# Patient Record
Sex: Female | Born: 1990 | Race: Black or African American | Hispanic: No | Marital: Single
Health system: Southern US, Community
[De-identification: ages and names within clinical notes are randomized; demographics above are authoritative.]

## PROBLEM LIST (undated history)

## (undated) ENCOUNTER — Inpatient Hospital Stay (HOSPITAL_COMMUNITY): Payer: Self-pay

## (undated) DIAGNOSIS — A599 Trichomoniasis, unspecified: Secondary | ICD-10-CM

## (undated) DIAGNOSIS — B009 Herpesviral infection, unspecified: Secondary | ICD-10-CM

## (undated) DIAGNOSIS — Z789 Other specified health status: Secondary | ICD-10-CM

## (undated) HISTORY — PX: NO PAST SURGERIES: SHX2092

---
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
## 2010-01-05 ENCOUNTER — Ambulatory Visit (HOSPITAL_COMMUNITY): Admission: RE | Admit: 2010-01-05 | Discharge: 2010-01-05 | Payer: Self-pay | Admitting: Obstetrics

## 2010-01-16 ENCOUNTER — Inpatient Hospital Stay (HOSPITAL_COMMUNITY): Admission: AD | Admit: 2010-01-16 | Discharge: 2010-01-16 | Payer: Self-pay | Admitting: Obstetrics & Gynecology

## 2010-04-19 ENCOUNTER — Ambulatory Visit (HOSPITAL_COMMUNITY): Admission: RE | Admit: 2010-04-19 | Discharge: 2010-04-19 | Payer: Self-pay | Admitting: Obstetrics

## 2010-05-19 ENCOUNTER — Inpatient Hospital Stay (HOSPITAL_COMMUNITY): Admission: AD | Admit: 2010-05-19 | Discharge: 2010-05-19 | Payer: Self-pay | Admitting: Obstetrics

## 2010-06-27 ENCOUNTER — Inpatient Hospital Stay (HOSPITAL_COMMUNITY): Admission: AD | Admit: 2010-06-27 | Discharge: 2010-06-27 | Payer: Self-pay | Admitting: Obstetrics

## 2010-06-28 ENCOUNTER — Inpatient Hospital Stay (HOSPITAL_COMMUNITY): Admission: AD | Admit: 2010-06-28 | Discharge: 2010-07-02 | Payer: Self-pay | Admitting: Obstetrics & Gynecology

## 2010-12-28 LAB — CBC
HCT: 38 % (ref 36.0–46.0)
Hemoglobin: 10.3 g/dL — ABNORMAL LOW (ref 12.0–15.0)
Hemoglobin: 12.4 g/dL (ref 12.0–15.0)
MCH: 32.5 pg (ref 26.0–34.0)
MCHC: 32.7 g/dL (ref 30.0–36.0)
MCV: 98.5 fL (ref 78.0–100.0)
RBC: 3.05 MIL/uL — ABNORMAL LOW (ref 3.87–5.11)
RBC: 3.82 MIL/uL — ABNORMAL LOW (ref 3.87–5.11)

## 2010-12-28 LAB — URINE CULTURE
Colony Count: NO GROWTH
Special Requests: NEGATIVE

## 2010-12-28 LAB — MRSA PCR SCREENING: MRSA by PCR: NEGATIVE

## 2010-12-29 LAB — WET PREP, GENITAL: Trich, Wet Prep: NONE SEEN

## 2010-12-31 LAB — WET PREP, GENITAL
Clue Cells Wet Prep HPF POC: NONE SEEN
Trich, Wet Prep: NONE SEEN
Yeast Wet Prep HPF POC: NONE SEEN

## 2010-12-31 LAB — URINALYSIS, ROUTINE W REFLEX MICROSCOPIC
Hgb urine dipstick: NEGATIVE
Nitrite: NEGATIVE
Protein, ur: NEGATIVE mg/dL
Urobilinogen, UA: 0.2 mg/dL (ref 0.0–1.0)

## 2010-12-31 LAB — GC/CHLAMYDIA PROBE AMP, GENITAL: GC Probe Amp, Genital: NEGATIVE

## 2011-01-03 LAB — WET PREP, GENITAL: Trich, Wet Prep: NONE SEEN

## 2011-01-21 LAB — WET PREP, GENITAL
Trich, Wet Prep: NONE SEEN
WBC, Wet Prep HPF POC: NONE SEEN
Yeast Wet Prep HPF POC: NONE SEEN

## 2011-01-21 LAB — POCT URINALYSIS DIP (DEVICE)
Glucose, UA: NEGATIVE mg/dL
Hgb urine dipstick: NEGATIVE
Specific Gravity, Urine: <=1.005 (ref 1.005–1.030)
Urobilinogen, UA: 0.2 mg/dL (ref 0.0–1.0)

## 2011-01-21 LAB — GC/CHLAMYDIA PROBE AMP, GENITAL
Chlamydia, DNA Probe: NEGATIVE
GC Probe Amp, Genital: NEGATIVE

## 2011-01-22 LAB — CULTURE, ROUTINE-ABSCESS: Gram Stain: NONE SEEN

## 2011-01-22 LAB — POCT PREGNANCY, URINE: Preg Test, Ur: NEGATIVE

## 2011-03-27 LAB — ABO/RH: RH Type: POSITIVE

## 2011-03-27 LAB — HEPATITIS B SURFACE ANTIGEN: Hepatitis B Surface Ag: NEGATIVE

## 2011-03-27 LAB — RUBELLA ANTIBODY, IGM: Rubella: IMMUNE

## 2011-04-04 ENCOUNTER — Inpatient Hospital Stay (INDEPENDENT_AMBULATORY_CARE_PROVIDER_SITE_OTHER)
Admission: RE | Admit: 2011-04-04 | Discharge: 2011-04-04 | Disposition: A | Discharge status: Home / Self Care | Payer: Medicaid Other | Source: Ambulatory Visit | Attending: Family Medicine | Admitting: Family Medicine

## 2011-04-04 DIAGNOSIS — IMO0002 Reserved for concepts with insufficient information to code with codable children: Secondary | ICD-10-CM

## 2011-04-06 ENCOUNTER — Inpatient Hospital Stay (INDEPENDENT_AMBULATORY_CARE_PROVIDER_SITE_OTHER)
Admission: RE | Admit: 2011-04-06 | Discharge: 2011-04-06 | Disposition: A | Discharge status: Home / Self Care | Payer: Medicaid Other | Source: Ambulatory Visit | Attending: Family Medicine | Admitting: Family Medicine

## 2011-04-06 DIAGNOSIS — IMO0002 Reserved for concepts with insufficient information to code with codable children: Secondary | ICD-10-CM

## 2011-04-09 LAB — CULTURE, ROUTINE-ABSCESS

## 2011-04-25 ENCOUNTER — Inpatient Hospital Stay (HOSPITAL_COMMUNITY)
Admission: RE | Admit: 2011-04-25 | Discharge: 2011-04-25 | Disposition: A | Discharge status: Home / Self Care | Payer: Self-pay | Source: Ambulatory Visit | Attending: Emergency Medicine | Admitting: Emergency Medicine

## 2011-06-04 ENCOUNTER — Inpatient Hospital Stay (HOSPITAL_COMMUNITY)
Admission: AD | Admit: 2011-06-04 | Discharge: 2011-06-04 | Disposition: A | Discharge status: Home / Self Care | Payer: Medicaid Other | Source: Ambulatory Visit | Attending: Obstetrics & Gynecology | Admitting: Obstetrics & Gynecology

## 2011-06-04 ENCOUNTER — Encounter (HOSPITAL_COMMUNITY): Payer: Self-pay | Admitting: *Deleted

## 2011-06-04 ENCOUNTER — Inpatient Hospital Stay (HOSPITAL_COMMUNITY): Payer: Medicaid Other

## 2011-06-04 DIAGNOSIS — B3731 Acute candidiasis of vulva and vagina: Secondary | ICD-10-CM | POA: Insufficient documentation

## 2011-06-04 DIAGNOSIS — Z3490 Encounter for supervision of normal pregnancy, unspecified, unspecified trimester: Secondary | ICD-10-CM

## 2011-06-04 DIAGNOSIS — L293 Anogenital pruritus, unspecified: Secondary | ICD-10-CM | POA: Insufficient documentation

## 2011-06-04 DIAGNOSIS — B373 Candidiasis of vulva and vagina: Secondary | ICD-10-CM

## 2011-06-04 DIAGNOSIS — O2390 Unspecified genitourinary tract infection in pregnancy, unspecified trimester: Secondary | ICD-10-CM | POA: Insufficient documentation

## 2011-06-04 HISTORY — DX: Trichomoniasis, unspecified: A59.9

## 2011-06-04 LAB — URINALYSIS, ROUTINE W REFLEX MICROSCOPIC
Glucose, UA: NEGATIVE mg/dL
Specific Gravity, Urine: 1.02 (ref 1.005–1.030)

## 2011-06-04 LAB — WET PREP, GENITAL: Trich, Wet Prep: NONE SEEN

## 2011-06-04 LAB — URINE MICROSCOPIC-ADD ON

## 2011-06-04 MED ORDER — NYSTATIN-TRIAMCINOLONE 100000-0.1 UNIT/GM-% EX CREA: TOPICAL_CREAM | Freq: Four times a day (QID) | CUTANEOUS | Status: DC

## 2011-06-04 NOTE — Progress Notes (Signed)
Pt c/o vaginal itching and white discharge. Pt is pregnant has not started [facility].

## 2011-06-04 NOTE — ED Provider Notes (Signed)
Beverly Marquez is a 20 y.o. female unk LMP in April presenting for vulvar itching and rash. She has not initiated [facility] this pregnancy, but plans to return to [facility]. She has not had a dating [place]. History OB History    Grav Para Term Preterm Abortions TAB SAB Ect Mult Living   2 1 1       1      Past Medical History  Diagnosis Date  . Trichomonas    No past surgical history on file. Family History: family history is not on file. Social History:  reports that she has never smoked. She does not have any smokeless tobacco history on file. She reports that she does not drink alcohol or use illicit drugs.  ROS: otherwise neg    Blood pressure 116/81, pulse 78, temperature 98.6 F (37 C), temperature source Oral, resp. rate 20, height 5\' 5"  (1.651 m), weight 68.04 kg (150 lb), last menstrual period 02/02/2011, SpO2 99.00%. Exam Physical Exam  Pelvic: vulva and perineum erythematous, excoriated. No distinct lesions or vesicles. Mod amount of thick white, odorless discharge. No VB. Abd: Fundus U/1 [place]: 17.5 week IUP, normal basic anatomy and fluid, heart views not well-visualized. Anterior placenta. Prenatal labs: ABO, Rh:   Antibody:   Rubella:   RPR: NON REACTIVE (09/14 2103)  HBsAg:    HIV:    GBS:     Results for orders placed during the hospital encounter of 06/04/11 (from the past 24 hour(s))  WET PREP, GENITAL     Status: Abnormal   Collection Time   06/04/11  2:05 PM      Component Value Range   Yeast, Wet Prep MODERATE (*) NONE SEEN    Trich, Wet Prep NONE SEEN  NONE SEEN    Clue Cells, Wet Prep NONE SEEN  NONE SEEN    WBC, Wet Prep HPF POC MANY (*) NONE SEEN   URINALYSIS, ROUTINE W REFLEX MICROSCOPIC     Status: Abnormal   Collection Time   06/04/11  2:10 PM      Component Value Range   Color, Urine YELLOW  YELLOW    Appearance HAZY (*) CLEAR    Specific Gravity, Urine 1.020  1.005 - 1.030    pH 7.0  5.0 - 8.0    Glucose, UA NEGATIVE  NEGATIVE (mg/dL)   Hgb urine  dipstick TRACE (*) NEGATIVE    Bilirubin Urine NEGATIVE  NEGATIVE    Ketones, ur NEGATIVE  NEGATIVE (mg/dL)   Protein, ur NEGATIVE  NEGATIVE (mg/dL)   Urobilinogen, UA 0.2  0.0 - 1.0 (mg/dL)   Nitrite NEGATIVE  NEGATIVE    Leukocytes, UA LARGE (*) NEGATIVE   URINE MICROSCOPIC-ADD ON     Status: Abnormal   Collection Time   06/04/11  2:10 PM      Component Value Range   Squamous Epithelial / LPF MANY (*) RARE    WBC, UA 3-6  <3 (WBC/hpf)   RBC / HPF 0-2  <3 (RBC/hpf)   Bacteria, UA FEW (*) RARE    Urine-Other AMORPHOUS URATES/PHOSPHATES      Assessment/Plan: Assessment: 1. VVC 2. 17.5 Week IUP  Plan: 1. Rx Mycolog 2. Initiate PRC ASAP.   Ayuub Penley 06/04/2011, 3:08 PM

## 2011-06-05 LAB — GC/CHLAMYDIA PROBE AMP, GENITAL: GC Probe Amp, Genital: NEGATIVE

## 2011-06-05 NOTE — ED Provider Notes (Signed)
Agree with above note.  Beverly Marquez. 06/05/2011 6:22 AM     

## 2011-06-06 LAB — HERPES SIMPLEX VIRUS CULTURE
Culture: NOT DETECTED
Special Requests: NORMAL

## 2011-10-01 ENCOUNTER — Telehealth: Payer: Self-pay | Admitting: *Deleted

## 2011-10-01 NOTE — Telephone Encounter (Signed)
Pt left message stating that she is a pt of [facility] practice and their office is closed. She asked what she can take for a H/A- reports that she has been having increasing frequency and intensity of H/A's.  I called pt this morning and left message that I cannot advise her because she is a pt from another practice. I strongly advised that she contact the [facility] office today and report her sx as it may be due to another problem. I also stated that whenever their office is closed there is a way to speak with someone affiliated with the practice. That information will be on their outgoing message.

## 2011-10-16 NOTE — L&D Delivery Note (Signed)
Delivery Note At 9:56 PM a viable female was delivered via  (Presentation: OA ).   Weight 6 lb 14 oz (3118 g).   Placenta status delivered w/assistance, intact.  Cord:  with the following complications: none.    Anesthesia:  Epidural Episiotomy: None Lacerations: None Suture Repair: n/a Est. Blood Loss (mL): 200 ml  Mom to postpartum.  Baby to nursery-stable.  [name],Jerae Izard A 11/15/2011, 10:12 PM

## 2011-11-15 ENCOUNTER — Encounter (HOSPITAL_COMMUNITY): Payer: Self-pay | Admitting: Anesthesiology

## 2011-11-15 ENCOUNTER — Encounter (HOSPITAL_COMMUNITY): Payer: Self-pay

## 2011-11-15 ENCOUNTER — Inpatient Hospital Stay (HOSPITAL_COMMUNITY): Payer: Medicaid Other | Admitting: Anesthesiology

## 2011-11-15 ENCOUNTER — Inpatient Hospital Stay (HOSPITAL_COMMUNITY)
Admission: AD | Admit: 2011-11-15 | Discharge: 2011-11-17 | DRG: 775 | Disposition: A | Discharge status: Home / Self Care | Payer: Medicaid Other | Source: Ambulatory Visit | Attending: Obstetrics & Gynecology | Admitting: Obstetrics & Gynecology

## 2011-11-15 DIAGNOSIS — IMO0001 Reserved for inherently not codable concepts without codable children: Secondary | ICD-10-CM

## 2011-11-15 LAB — CBC
MCHC: 33.9 g/dL (ref 30.0–36.0)
MCV: 96.2 fL (ref 78.0–100.0)
Platelets: 309 10*3/uL (ref 150–400)
RDW: 13.6 % (ref 11.5–15.5)
WBC: 10.6 10*3/uL — ABNORMAL HIGH (ref 4.0–10.5)

## 2011-11-15 MED ORDER — LACTATED RINGERS IV SOLN
INTRAVENOUS | Status: DC
  Administered 2011-11-15 (×2): via INTRAVENOUS

## 2011-11-15 MED ORDER — OXYTOCIN BOLUS FROM INFUSION
500.0000 mL | Freq: Once | INTRAVENOUS | Status: DC
  Filled 2011-11-15: qty 1000
  Filled 2011-11-15: qty 500

## 2011-11-15 MED ORDER — EPHEDRINE 5 MG/ML INJ: 10.0000 mg | INTRAVENOUS | Status: DC | PRN

## 2011-11-15 MED ORDER — LIDOCAINE HCL (PF) 1 % IJ SOLN
30.0000 mL | INTRAMUSCULAR | Status: DC | PRN
  Filled 2011-11-15: qty 30

## 2011-11-15 MED ORDER — CITRIC ACID-SODIUM CITRATE 334-500 MG/5ML PO SOLN: 30.0000 mL | ORAL | Status: DC | PRN

## 2011-11-15 MED ORDER — LACTATED RINGERS IV SOLN: 500.0000 mL | Freq: Once | INTRAVENOUS | Status: DC

## 2011-11-15 MED ORDER — OXYCODONE-ACETAMINOPHEN 5-325 MG PO TABS: 1.0000 | ORAL_TABLET | ORAL | Status: DC | PRN

## 2011-11-15 MED ORDER — DIPHENHYDRAMINE HCL 50 MG/ML IJ SOLN: 12.5000 mg | INTRAMUSCULAR | Status: DC | PRN

## 2011-11-15 MED ORDER — BUTORPHANOL TARTRATE 2 MG/ML IJ SOLN: 1.0000 mg | INTRAMUSCULAR | Status: DC | PRN

## 2011-11-15 MED ORDER — FLEET ENEMA 7-19 GM/118ML RE ENEM: 1.0000 | ENEMA | RECTAL | Status: DC | PRN

## 2011-11-15 MED ORDER — ACETAMINOPHEN 325 MG PO TABS: 650.0000 mg | ORAL_TABLET | ORAL | Status: DC | PRN

## 2011-11-15 MED ORDER — IBUPROFEN 600 MG PO TABS: 600.0000 mg | ORAL_TABLET | Freq: Four times a day (QID) | ORAL | Status: DC | PRN

## 2011-11-15 MED ORDER — LACTATED RINGERS IV SOLN
500.0000 mL | INTRAVENOUS | Status: DC | PRN
  Administered 2011-11-15: 500 mL via INTRAVENOUS

## 2011-11-15 MED ORDER — PHENYLEPHRINE 40 MCG/ML (10ML) SYRINGE FOR IV PUSH (FOR BLOOD PRESSURE SUPPORT): 80.0000 ug | PREFILLED_SYRINGE | INTRAVENOUS | Status: DC | PRN

## 2011-11-15 MED ORDER — OXYTOCIN 20 UNITS IN LACTATED RINGERS INFUSION - SIMPLE
125.0000 mL/h | Freq: Once | INTRAVENOUS | Status: AC
  Administered 2011-11-15: 999 mL/h via INTRAVENOUS

## 2011-11-15 MED ORDER — EPHEDRINE 5 MG/ML INJ
10.0000 mg | INTRAVENOUS | Status: DC | PRN
  Filled 2011-11-15: qty 4

## 2011-11-15 MED ORDER — PHENYLEPHRINE 40 MCG/ML (10ML) SYRINGE FOR IV PUSH (FOR BLOOD PRESSURE SUPPORT)
80.0000 ug | PREFILLED_SYRINGE | INTRAVENOUS | Status: DC | PRN
  Filled 2011-11-15: qty 5

## 2011-11-15 MED ORDER — FENTANYL 2.5 MCG/ML BUPIVACAINE 1/10 % EPIDURAL INFUSION (WH - ANES)
14.0000 mL/h | INTRAMUSCULAR | Status: DC
  Administered 2011-11-15: 14 mL/h via EPIDURAL
  Filled 2011-11-15: qty 60

## 2011-11-15 MED ORDER — ONDANSETRON HCL 4 MG/2ML IJ SOLN: 4.0000 mg | Freq: Four times a day (QID) | INTRAMUSCULAR | Status: DC | PRN

## 2011-11-15 MED ORDER — LIDOCAINE HCL 1.5 % IJ SOLN
INTRAMUSCULAR | Status: DC | PRN
  Administered 2011-11-15 (×2): 5 mL via EPIDURAL

## 2011-11-15 NOTE — H&P (Signed)
Beverly Marquez is a 21 y.o. female presenting for contractions. Maternal Medical History:  Reason for admission: Reason for admission: contractions.  Contractions: Frequency: regular.    Fetal activity: Perceived fetal activity is normal.    Prenatal complications: no prenatal complications   OB History    Grav Para Term Preterm Abortions TAB SAB Ect Mult Living   2 1 1       1      Past Medical History  Diagnosis Date  . Trichomonas    History reviewed. No pertinent past surgical history. Family History: family history is not on file. Social History:  reports that she has never smoked. She does not have any smokeless tobacco history on file. She reports that she does not drink alcohol or use illicit drugs.  Review of Systems  Constitutional: Negative for fever.  Eyes: Negative for blurred vision.  Respiratory: Negative for shortness of breath.   Gastrointestinal: Negative for vomiting.  Skin: Negative for rash.  Neurological: Negative for headaches.    Dilation: 5 Effacement (%): 80 Station: -2 Exam by:: apannell,rn Blood pressure 127/65, pulse 100, temperature 98.5 F (36.9 C), temperature source Oral, resp. rate 18, weight 81.647 kg (180 lb), last menstrual period 02/02/2011, SpO2 99.00%. Maternal Exam:  Uterine Assessment: Contraction frequency is regular.   Abdomen: Fetal presentation: vertex  Introitus: not evaluated.   Cervix: Cervix evaluated by digital exam.     Fetal Exam Fetal Monitor Review: Variability: moderate (6-25 bpm).   Pattern: accelerations present and no decelerations.    Fetal State Assessment: Category I - tracings are normal.     Physical Exam  Constitutional: She appears well-developed.  HENT:  Head: Normocephalic.  Neck: Neck supple. No thyromegaly present.  Cardiovascular: Normal rate and regular rhythm.   Respiratory: Breath sounds normal.  GI: Soft. Bowel sounds are normal.  Skin: No rash noted.    Prenatal  labs: ABO, Rh: O/Positive/-- (06/12 0000) Antibody: Negative (06/12 0000) Rubella: Immune (06/12 0000) RPR: Nonreactive (06/12 0000)  HBsAg: Negative (06/12 0000)  HIV: Non-reactive (06/12 0000)  GBS: Negative (06/12 0000)   Assessment/Plan: Primipara at term, active labor, Category 1 FHT Admit, anticipate an NSVD   [name],[name] A 11/15/2011, 8:17 PM

## 2011-11-15 NOTE — Progress Notes (Signed)
Patient is in for labor eval. She states that the ctx are strong and frequent since 11am. She denies any lof. She reports good fetal movement

## 2011-11-15 NOTE — Progress Notes (Signed)
Beverly Marquez is a 21 y.o. G2P1001 at [redacted]w[redacted]d by LMP admitted for active labor  Subjective: Comfortable  Objective: BP 128/74  Pulse 96  Temp(Src) 98.5 F (36.9 C) (Oral)  Resp 20  Wt 81.647 kg (180 lb)  SpO2 99%  LMP 02/02/2011      FHT:  FHR: 140 bpm, variability: moderate,  accelerations:  Present,  decelerations:  Absent UC:   regular, every 4 minutes SVE:   Dilation: 7 Effacement (%): 80 Station: -1 Exam by:: Dr. Tamela Oddi IUPC placed/AROM for clear fluid  Labs: Lab Results  Component Value Date   WBC 10.6* 11/15/2011   HGB 11.9* 11/15/2011   HCT 35.1* 11/15/2011   MCV 96.2 11/15/2011   PLT 309 11/15/2011    Assessment / Plan: Spontaneous labor, progressing normally  Labor: Progressing normally Preeclampsia:  n/a Fetal Wellbeing:  Category I Pain Control:  Epidural I/D:  n/a Anticipated MOD:  NSVD  [name],[name] 11/15/2011, 8:57 PM

## 2011-11-15 NOTE — Anesthesia Preprocedure Evaluation (Signed)

## 2011-11-15 NOTE — Progress Notes (Signed)
Delivery of live viable female at 2156.

## 2011-11-15 NOTE — Anesthesia Procedure Notes (Signed)
Epidural Patient location during procedure: OB Start time: 11/15/2011 6:31 PM  Staffing Anesthesiologist: Brayton Caves R Performed by: anesthesiologist   Preanesthetic Checklist Completed: patient identified, site marked, surgical consent, pre-op evaluation, timeout performed, IV checked, risks and benefits discussed and monitors and equipment checked  Epidural Patient position: sitting Prep: site prepped and draped and DuraPrep Patient monitoring: continuous pulse ox and blood pressure Approach: midline Injection technique: LOR air  Needle:  Needle type: Tuohy  Needle gauge: 17 G Needle length: 9 cm Needle insertion depth: 5 cm cm Catheter type: closed end flexible Catheter size: 19 Gauge Catheter at skin depth: 10 cm Test dose: negative  Assessment Events: blood not aspirated, injection not painful, no injection resistance, negative IV test and no paresthesia  Additional Notes Patient identified.  Risk benefits discussed including failed block, incomplete pain control, headache, nerve damage, paralysis, blood pressure changes, nausea, vomiting, reactions to medication both toxic or allergic, and postpartum back pain.  Patient expressed understanding and wished to proceed.  All questions were answered.  Sterile technique used throughout procedure and epidural site dressed with sterile barrier dressing. No paresthesia or other complications noted.The patient did not experience any signs of intravascular injection such as tinnitus or metallic taste in mouth nor signs of intrathecal spread such as rapid motor block. Please see nursing notes for vital signs.

## 2011-11-15 NOTE — Progress Notes (Signed)
Dr Tamela Oddi notified of patient, tracing, ctx pattern and sve result. She states that she will admit patient and will enter admit orders. Will not treat for gbs. Its unknown results. Aware of history of positive mrsa in June 2012

## 2011-11-16 LAB — CBC
HCT: 35.4 % — ABNORMAL LOW (ref 36.0–46.0)
Hemoglobin: 11.7 g/dL — ABNORMAL LOW (ref 12.0–15.0)
MCV: 97.5 fL (ref 78.0–100.0)
WBC: 15.6 10*3/uL — ABNORMAL HIGH (ref 4.0–10.5)

## 2011-11-16 LAB — ABO/RH: ABO/RH(D): O POS

## 2011-11-16 MED ORDER — DIPHENHYDRAMINE HCL 25 MG PO CAPS: 25.0000 mg | ORAL_CAPSULE | Freq: Four times a day (QID) | ORAL | Status: DC | PRN

## 2011-11-16 MED ORDER — INFLUENZA VIRUS VACC SPLIT PF IM SUSP: 0.5000 mL | INTRAMUSCULAR | Status: DC | PRN

## 2011-11-16 MED ORDER — ZOLPIDEM TARTRATE 5 MG PO TABS: 5.0000 mg | ORAL_TABLET | Freq: Every evening | ORAL | Status: DC | PRN

## 2011-11-16 MED ORDER — DIBUCAINE 1 % RE OINT: 1.0000 "application " | TOPICAL_OINTMENT | RECTAL | Status: DC | PRN

## 2011-11-16 MED ORDER — MEASLES, MUMPS & RUBELLA VAC ~~LOC~~ INJ
0.5000 mL | INJECTION | Freq: Once | SUBCUTANEOUS | Status: DC
  Filled 2011-11-16: qty 0.5

## 2011-11-16 MED ORDER — INFLUENZA VIRUS VACC SPLIT PF IM SUSP
0.5000 mL | Freq: Once | INTRAMUSCULAR | Status: AC
  Administered 2011-11-16: 0.5 mL via INTRAMUSCULAR
  Filled 2011-11-16: qty 0.5

## 2011-11-16 MED ORDER — ONDANSETRON HCL 4 MG PO TABS: 4.0000 mg | ORAL_TABLET | ORAL | Status: DC | PRN

## 2011-11-16 MED ORDER — MEDROXYPROGESTERONE ACETATE 150 MG/ML IM SUSP: 150.0000 mg | INTRAMUSCULAR | Status: DC | PRN

## 2011-11-16 MED ORDER — IBUPROFEN 600 MG PO TABS
600.0000 mg | ORAL_TABLET | Freq: Four times a day (QID) | ORAL | Status: DC
  Administered 2011-11-16 – 2011-11-17 (×5): 600 mg via ORAL
  Filled 2011-11-16 (×6): qty 1

## 2011-11-16 MED ORDER — LANOLIN HYDROUS EX OINT: TOPICAL_OINTMENT | CUTANEOUS | Status: DC | PRN

## 2011-11-16 MED ORDER — WITCH HAZEL-GLYCERIN EX PADS: 1.0000 "application " | MEDICATED_PAD | CUTANEOUS | Status: DC | PRN

## 2011-11-16 MED ORDER — PRENATAL MULTIVITAMIN CH
1.0000 | ORAL_TABLET | Freq: Every day | ORAL | Status: DC
  Administered 2011-11-16: 1 via ORAL
  Filled 2011-11-16: qty 1

## 2011-11-16 MED ORDER — FERROUS SULFATE 325 (65 FE) MG PO TABS
325.0000 mg | ORAL_TABLET | Freq: Two times a day (BID) | ORAL | Status: DC
  Administered 2011-11-16 – 2011-11-17 (×3): 325 mg via ORAL
  Filled 2011-11-16 (×3): qty 1

## 2011-11-16 MED ORDER — SENNOSIDES-DOCUSATE SODIUM 8.6-50 MG PO TABS: 2.0000 | ORAL_TABLET | Freq: Every day | ORAL | Status: DC

## 2011-11-16 MED ORDER — OXYCODONE-ACETAMINOPHEN 5-325 MG PO TABS: 1.0000 | ORAL_TABLET | ORAL | Status: DC | PRN

## 2011-11-16 MED ORDER — TETANUS-DIPHTH-ACELL PERTUSSIS 5-2.5-18.5 LF-MCG/0.5 IM SUSP
0.5000 mL | Freq: Once | INTRAMUSCULAR | Status: AC
  Administered 2011-11-16: 0.5 mL via INTRAMUSCULAR
  Filled 2011-11-16: qty 0.5

## 2011-11-16 MED ORDER — ONDANSETRON HCL 4 MG/2ML IJ SOLN: 4.0000 mg | INTRAMUSCULAR | Status: DC | PRN

## 2011-11-16 MED ORDER — BENZOCAINE-MENTHOL 20-0.5 % EX AERO: 1.0000 "application " | INHALATION_SPRAY | CUTANEOUS | Status: DC | PRN

## 2011-11-16 MED ORDER — MAGNESIUM HYDROXIDE 400 MG/5ML PO SUSP: 30.0000 mL | ORAL | Status: DC | PRN

## 2011-11-16 NOTE — Anesthesia Postprocedure Evaluation (Signed)
  Anesthesia Post-op Note  Patient: Beverly Marquez  Procedure(s) Performed: * No procedures listed *  Patient Location: Mother/Baby  Anesthesia Type: Epidural  Level of Consciousness: awake  Airway and Oxygen Therapy: Patient Spontanous Breathing  Post-op Pain: none  Post-op Assessment: Patient's Cardiovascular Status Stable and Respiratory Function Stable  Post-op Vital Signs: Reviewed and stable  Complications: No apparent anesthesia complications 

## 2011-11-16 NOTE — Anesthesia Postprocedure Evaluation (Signed)
  Anesthesia Post-op Note  Patient: Beverly Marquez  Procedure(s) Performed: * No procedures listed *  Patient Location: Mother/Baby  Anesthesia Type: Epidural  Level of Consciousness: awake  Airway and Oxygen Therapy: Patient Spontanous Breathing  Post-op Pain: none  Post-op Assessment: Patient's Cardiovascular Status Stable and Respiratory Function Stable  Post-op Vital Signs: Reviewed and stable  Complications: No apparent anesthesia complications

## 2011-11-16 NOTE — Progress Notes (Signed)
Post Partum Day 1 Subjective: no complaints, up ad lib, voiding, tolerating PO, + flatus and breastfeeding without difficulty. Denies headache, RUQ pain or blurred vision. Pain well controlled.   Objective: Blood pressure 119/81, pulse 79, temperature 98.5 F (36.9 C), temperature source Oral, resp. rate 18, height 5\' 5"  (1.651 m), weight 81.647 kg (180 lb), last menstrual period 02/02/2011, SpO2 99.00%, unknown if currently breastfeeding.  Physical Exam:  General: alert, cooperative, appears stated age and no distress Lochia: appropriate Uterine Fundus: firm Perineum: intact DVT Evaluation: No evidence of DVT seen on physical exam. Negative Homan's sign. No cords or calf tenderness.   Basename 11/16/11 0541 11/15/11 1700  HGB 11.7* 11.9*  HCT 35.4* 35.1*    Assessment/Plan: Plan for discharge tomorrow, Breastfeeding and Contraception considering Nexplanon.   LOS: 1 day   HAMBY, Megon Kalina 11/16/2011, 7:42 AM

## 2011-11-16 NOTE — Progress Notes (Signed)
UR Chart review completed.  

## 2011-11-17 NOTE — Discharge Summary (Signed)
Obstetric Discharge Summary Reason for Admission: onset of labor Prenatal Procedures: none Intrapartum Procedures: spontaneous vaginal delivery Postpartum Procedures: none Complications-Operative and Postpartum: none Hemoglobin  Date Value Range Status  11/16/2011 11.7* 12.0-15.0 (g/dL) Final     HCT  Date Value Range Status  11/16/2011 35.4* 36.0-46.0 (%) Final    Discharge Diagnoses: Term Pregnancy-delivered  Discharge Information: Date: 11/17/2011 Activity: pelvic rest Diet: routine Medications: Percocet Condition: stable Instructions: refer to practice specific booklet Discharge to: home Follow-up Information    Follow up with Antionette Char A, MD. Call in 6 weeks.   Contact information:   [address], [address] [place] [address] [phone]          Newborn Data: Live born female  Birth Weight: 6 lb 14 oz (3118 g) APGAR: 8,   Home with mother.  MARSHALL,BERNARD A 11/17/2011, 6:44 AM

## 2012-01-21 ENCOUNTER — Encounter (HOSPITAL_COMMUNITY): Payer: Self-pay | Admitting: *Deleted

## 2012-01-21 ENCOUNTER — Inpatient Hospital Stay (HOSPITAL_COMMUNITY)
Admission: AD | Admit: 2012-01-21 | Discharge: 2012-01-21 | Disposition: A | Discharge status: Home / Self Care | Payer: Medicaid Other | Source: Ambulatory Visit | Attending: Obstetrics & Gynecology | Admitting: Obstetrics & Gynecology

## 2012-01-21 DIAGNOSIS — N949 Unspecified condition associated with female genital organs and menstrual cycle: Secondary | ICD-10-CM | POA: Insufficient documentation

## 2012-01-21 DIAGNOSIS — O26719 Subluxation of symphysis (pubis) in pregnancy, unspecified trimester: Secondary | ICD-10-CM

## 2012-01-21 DIAGNOSIS — O99893 Other specified diseases and conditions complicating puerperium: Secondary | ICD-10-CM | POA: Insufficient documentation

## 2012-01-21 DIAGNOSIS — O34599 Maternal care for other abnormalities of gravid uterus, unspecified trimester: Secondary | ICD-10-CM

## 2012-01-21 LAB — URINALYSIS, ROUTINE W REFLEX MICROSCOPIC
Glucose, UA: NEGATIVE mg/dL
Ketones, ur: NEGATIVE mg/dL
Leukocytes, UA: NEGATIVE
Nitrite: NEGATIVE
Specific Gravity, Urine: 1.01 (ref 1.005–1.030)
pH: 6.5 (ref 5.0–8.0)

## 2012-01-21 LAB — POCT PREGNANCY, URINE: Preg Test, Ur: NEGATIVE

## 2012-01-21 LAB — URINE MICROSCOPIC-ADD ON

## 2012-01-21 MED ORDER — IBUPROFEN 800 MG PO TABS: 800.0000 mg | ORAL_TABLET | Freq: Three times a day (TID) | ORAL | Status: AC | PRN

## 2012-01-21 NOTE — Discharge Instructions (Signed)
Try a postpartum hip support or maternity support brace for extra support. Motrin as needed for pain. If pain worsens or does not improve with time, follow up with your regular doctor.

## 2012-01-21 NOTE — MAU Note (Signed)
Vaginal pain started when she was lying down sleeping. Now it hurts when she walks also. Having period now for 10 days. States is having vaginal pain now, but hurts worse when lying down.

## 2012-01-21 NOTE — MAU Provider Note (Signed)
History     CSN: [id number]  Arrival date and time: 01/21/12 1038   None     Chief Complaint  Patient presents with  . Vaginal Pain   HPI 20 y.o. [id number], 2 months postpartum with midpelvic and groin pain, pain with lying down and with ambulation, had similar pain after last pregnancy, but this is worse. No pain with urination, no vaginal discharge, + menses.    Past Medical History  Diagnosis Date  . Trichomonas     History reviewed. No pertinent past surgical history.  History reviewed. No pertinent family history.  History  Substance Use Topics  . Smoking status: Never Smoker   . Smokeless tobacco: Never Used  . Alcohol Use: No    Allergies: No Known Allergies  No prescriptions prior to admission    Review of Systems  Constitutional: Negative.   Respiratory: Negative.   Cardiovascular: Negative.   Gastrointestinal: Negative for nausea, vomiting, abdominal pain, diarrhea and constipation.  Genitourinary: Negative for dysuria, urgency, frequency, hematuria and flank pain.       Negative for vaginal discharge, + menses   Musculoskeletal: Positive for joint pain.  Neurological: Negative.   Psychiatric/Behavioral: Negative.    Physical Exam   Blood pressure 126/67, pulse 84, temperature 97.2 F (36.2 C), temperature source Oral, resp. rate 18, height 5\' 5"  (1.651 m), weight 169 lb (76.658 kg), last menstrual period 01/10/2012, SpO2 99.00%, unknown if currently breastfeeding.  Physical Exam  Nursing note and vitals reviewed. Constitutional: She is oriented to person, place, and time. She appears well-developed and well-nourished. No distress.  Cardiovascular: Normal rate.   Respiratory: Effort normal.  GI: Soft. There is no tenderness.  Musculoskeletal: She exhibits tenderness (mild symphysis pubis tenderness).  Neurological: She is alert and oriented to person, place, and time.  Skin: Skin is warm and dry.  Psychiatric: She has a normal mood and affect.     MAU Course  Procedures  Results for orders placed during the hospital encounter of 01/21/12 (from the past 24 hour(s))  URINALYSIS, ROUTINE W REFLEX MICROSCOPIC     Status: Abnormal   Collection Time   01/21/12 11:03 AM      Component Value Range   Color, Urine YELLOW  YELLOW    APPearance HAZY (*) CLEAR    Specific Gravity, Urine 1.010  1.005 - 1.030    pH 6.5  5.0 - 8.0    Glucose, UA NEGATIVE  NEGATIVE (mg/dL)   Hgb urine dipstick LARGE (*) NEGATIVE    Bilirubin Urine NEGATIVE  NEGATIVE    Ketones, ur NEGATIVE  NEGATIVE (mg/dL)   Protein, ur NEGATIVE  NEGATIVE (mg/dL)   Urobilinogen, UA 0.2  0.0 - 1.0 (mg/dL)   Nitrite NEGATIVE  NEGATIVE    Leukocytes, UA NEGATIVE  NEGATIVE   URINE MICROSCOPIC-ADD ON     Status: Abnormal   Collection Time   01/21/12 11:03 AM      Component Value Range   Squamous Epithelial / LPF MANY (*) RARE    RBC / HPF 3-6  <3 (RBC/hpf)  POCT PREGNANCY, URINE     Status: Normal   Collection Time   01/21/12 11:18 AM      Component Value Range   Preg Test, Ur NEGATIVE  NEGATIVE      Assessment and Plan  21 y.o. [id number] with postpartum pelvic pain, c/w symphysis pubis dysfunction Rev'd comfort measures, can try hip support or maternity brace, f/u if no improvement  FRAZIER,NATALIE 01/21/2012, 11:39 AM

## 2012-01-21 NOTE — MAU Note (Signed)
When CNM discussing sxs., patient now states that pain is outside of vagina, more in suprapubic area.

## 2012-06-03 ENCOUNTER — Encounter (HOSPITAL_COMMUNITY): Payer: Self-pay

## 2012-06-03 ENCOUNTER — Inpatient Hospital Stay (HOSPITAL_COMMUNITY)
Admission: AD | Admit: 2012-06-03 | Discharge: 2012-06-03 | Disposition: A | Discharge status: Home / Self Care | Payer: Medicaid Other | Source: Ambulatory Visit | Attending: Obstetrics | Admitting: Obstetrics

## 2012-06-03 DIAGNOSIS — Z113 Encounter for screening for infections with a predominantly sexual mode of transmission: Secondary | ICD-10-CM

## 2012-06-03 DIAGNOSIS — N949 Unspecified condition associated with female genital organs and menstrual cycle: Secondary | ICD-10-CM | POA: Insufficient documentation

## 2012-06-03 HISTORY — DX: Other specified health status: Z78.9

## 2012-06-03 LAB — POCT PREGNANCY, URINE: Preg Test, Ur: NEGATIVE

## 2012-06-03 LAB — URINALYSIS, ROUTINE W REFLEX MICROSCOPIC
Glucose, UA: NEGATIVE mg/dL
Hgb urine dipstick: NEGATIVE
Leukocytes, UA: NEGATIVE
Specific Gravity, Urine: 1.025 (ref 1.005–1.030)
pH: 6 (ref 5.0–8.0)

## 2012-06-03 LAB — WET PREP, GENITAL: Trich, Wet Prep: NONE SEEN

## 2012-06-03 NOTE — MAU Provider Note (Signed)
History     CSN: [id number]  Arrival date and time: 06/03/12 1633   First Provider Initiated Contact with Patient 06/03/12 1824      Chief Complaint  Patient presents with  . Vaginal Discharge   HPI 21 y.o. [id number] with vaginal discharge, new sex partner, requesting STI testing. Denies pain or bleeding.     Past Medical History  Diagnosis Date  . Trichomonas   . No pertinent past medical history     Past Surgical History  Procedure Date  . No past surgeries     Family History  Problem Relation Age of Onset  . Other Neg Hx     History  Substance Use Topics  . Smoking status: Never Smoker   . Smokeless tobacco: Never Used  . Alcohol Use: No    Allergies: No Known Allergies  No prescriptions prior to admission    Review of Systems  Constitutional: Negative.   Respiratory: Negative.   Cardiovascular: Negative.   Gastrointestinal: Negative for nausea, vomiting, abdominal pain, diarrhea and constipation.  Genitourinary: Negative for dysuria, urgency, frequency, hematuria and flank pain.       Negative for vaginal bleeding, dyspareunia, + vaginal discharge  Musculoskeletal: Negative.   Neurological: Negative.   Psychiatric/Behavioral: Negative.    Physical Exam   Blood pressure 109/70, pulse 64, temperature 98.3 F (36.8 C), temperature source Oral, resp. rate 16, height 5\' 5"  (1.651 m), weight 161 lb 12.8 oz (73.392 kg), SpO2 100.00%, unknown if currently breastfeeding.  Physical Exam  Nursing note and vitals reviewed. Constitutional: She is oriented to person, place, and time. She appears well-developed and well-nourished. No distress.  Cardiovascular: Normal rate.   Respiratory: Effort normal.  Genitourinary: There is no rash, tenderness or lesion on the right labia. There is no rash, tenderness or lesion on the left labia. Cervix exhibits no discharge and no friability. No erythema, tenderness or bleeding around the vagina. Vaginal discharge (clear,  slight odor) found.  Musculoskeletal: Normal range of motion.  Neurological: She is alert and oriented to person, place, and time.  Skin: Skin is warm and dry.  Psychiatric: She has a normal mood and affect.    MAU Course  Procedures  Results for orders placed during the hospital encounter of 06/03/12 (from the past 24 hour(s))  URINALYSIS, ROUTINE W REFLEX MICROSCOPIC     Status: Abnormal   Collection Time   06/03/12  5:45 PM      Component Value Range   Color, Urine YELLOW  YELLOW   APPearance HAZY (*) CLEAR   Specific Gravity, Urine 1.025  1.005 - 1.030   pH 6.0  5.0 - 8.0   Glucose, UA NEGATIVE  NEGATIVE mg/dL   Hgb urine dipstick NEGATIVE  NEGATIVE   Bilirubin Urine NEGATIVE  NEGATIVE   Ketones, ur NEGATIVE  NEGATIVE mg/dL   Protein, ur NEGATIVE  NEGATIVE mg/dL   Urobilinogen, UA 0.2  0.0 - 1.0 mg/dL   Nitrite NEGATIVE  NEGATIVE   Leukocytes, UA NEGATIVE  NEGATIVE  WET PREP, GENITAL     Status: Abnormal   Collection Time   06/03/12  6:19 PM      Component Value Range   Yeast Wet Prep HPF POC NONE SEEN  NONE SEEN   Trich, Wet Prep NONE SEEN  NONE SEEN   Clue Cells Wet Prep HPF POC NONE SEEN  NONE SEEN   WBC, Wet Prep HPF POC FEW (*) NONE SEEN  POCT PREGNANCY, URINE     Status:  Normal   Collection Time   06/03/12  6:21 PM      Component Value Range   Preg Test, Ur NEGATIVE  NEGATIVE     Assessment and Plan  21 y.o. [id number]  STI screening GC/CT results pending  Shonna Deiter 06/03/2012, 6:46 PM

## 2012-06-03 NOTE — MAU Note (Signed)
Patient states she wants to be checked for STI's. Has a new partner and a slight vaginal discharge with odor. Denies pain or bleeding.

## 2012-06-04 LAB — GC/CHLAMYDIA PROBE AMP, GENITAL: GC Probe Amp, Genital: NEGATIVE

## 2012-06-09 ENCOUNTER — Telehealth: Payer: Self-pay

## 2012-06-09 NOTE — Telephone Encounter (Signed)
Pt returned call and I informed her that her results were normal. Pt stated understanding and had no further questions.

## 2012-06-09 NOTE — Telephone Encounter (Signed)
Called pt and left messge that we are returning your call to please give a return call back to the clinics.  Re: Pt is not a clinics pt was seen in MAU on 06/03/12

## 2012-07-31 ENCOUNTER — Inpatient Hospital Stay (HOSPITAL_COMMUNITY)
Admission: AD | Admit: 2012-07-31 | Discharge: 2012-07-31 | Disposition: A | Discharge status: Home / Self Care | Payer: Medicaid Other | Source: Ambulatory Visit | Attending: Obstetrics | Admitting: Obstetrics

## 2012-07-31 ENCOUNTER — Encounter (HOSPITAL_COMMUNITY): Payer: Self-pay | Admitting: *Deleted

## 2012-07-31 DIAGNOSIS — L293 Anogenital pruritus, unspecified: Secondary | ICD-10-CM | POA: Insufficient documentation

## 2012-07-31 DIAGNOSIS — A5901 Trichomonal vulvovaginitis: Secondary | ICD-10-CM | POA: Insufficient documentation

## 2012-07-31 LAB — URINALYSIS, ROUTINE W REFLEX MICROSCOPIC
Bilirubin Urine: NEGATIVE
Nitrite: NEGATIVE
Specific Gravity, Urine: 1.01 (ref 1.005–1.030)
Urobilinogen, UA: 0.2 mg/dL (ref 0.0–1.0)
pH: 7 (ref 5.0–8.0)

## 2012-07-31 LAB — WET PREP, GENITAL
Clue Cells Wet Prep HPF POC: NONE SEEN
Yeast Wet Prep HPF POC: NONE SEEN

## 2012-07-31 LAB — URINE MICROSCOPIC-ADD ON

## 2012-07-31 MED ORDER — METRONIDAZOLE 500 MG PO TABS
2000.0000 mg | ORAL_TABLET | Freq: Once | ORAL | Status: AC
  Administered 2012-07-31: 2000 mg via ORAL
  Filled 2012-07-31: qty 4

## 2012-07-31 NOTE — MAU Provider Note (Signed)
History     CSN: [id number]  Arrival date and time: 07/31/12 1149   First Provider Initiated Contact with Patient 07/31/12 1349      Chief Complaint  Patient presents with  . Vaginal Itching   HPI  Pt is not pregnant and complains of vaginal itching for 2 days. She is on  implanon birth control.  She has an 64 month old delivered Dr. Clearance Coots.  She denies vaginal discharge, UTI symptoms.  Pt has a history of Trichomonas at age 21.  Past Medical History  Diagnosis Date  . Trichomonas   . No pertinent past medical history     Past Surgical History  Procedure Date  . No past surgeries     Family History  Problem Relation Age of Onset  . Other Neg Hx     History  Substance Use Topics  . Smoking status: Never Smoker   . Smokeless tobacco: Never Used  . Alcohol Use: No    Allergies: No Known Allergies  No prescriptions prior to admission    Review of Systems  Constitutional: Negative for fever and chills.  Gastrointestinal: Negative for abdominal pain.  Genitourinary: Negative for dysuria and urgency.  Musculoskeletal: Negative for back pain.  Neurological: Negative for headaches.   Physical Exam   Blood pressure 111/61, pulse 84, temperature 97.3 F (36.3 C), temperature source Oral, resp. rate 16, height 5\' 5"  (1.651 m), weight 68.04 kg (150 lb), unknown if currently breastfeeding.  Physical Exam  Vitals reviewed. Constitutional: She appears well-developed and well-nourished.  HENT:  Head: Normocephalic.  Eyes: Pupils are equal, round, and reactive to light.  Neck: Normal range of motion. Neck supple.  Cardiovascular: Normal rate.   Respiratory: Effort normal.  GI: Soft. She exhibits no distension. There is no tenderness. There is no rebound and no guarding.  Genitourinary:       Vaginal mucosa reddened; mod amount of frothy yellow vaginal discharge; cervix reddened; uterus NSSC NT adnexal without appreciable enlargement or tenderness  Musculoskeletal:  Normal range of motion.  Neurological: She is alert.  Skin: Skin is warm and dry.  Psychiatric: She has a normal mood and affect.    MAU Course  Procedures Results for orders placed during the hospital encounter of 07/31/12 (from the past 24 hour(s))  URINALYSIS, ROUTINE W REFLEX MICROSCOPIC     Status: Abnormal   Collection Time   07/31/12 12:15 PM      Component Value Range   Color, Urine YELLOW  YELLOW   APPearance HAZY (*) CLEAR   Specific Gravity, Urine 1.010  1.005 - 1.030   pH 7.0  5.0 - 8.0   Glucose, UA NEGATIVE  NEGATIVE mg/dL   Hgb urine dipstick NEGATIVE  NEGATIVE   Bilirubin Urine NEGATIVE  NEGATIVE   Ketones, ur NEGATIVE  NEGATIVE mg/dL   Protein, ur NEGATIVE  NEGATIVE mg/dL   Urobilinogen, UA 0.2  0.0 - 1.0 mg/dL   Nitrite NEGATIVE  NEGATIVE   Leukocytes, UA SMALL (*) NEGATIVE  URINE MICROSCOPIC-ADD ON     Status: Abnormal   Collection Time   07/31/12 12:15 PM      Component Value Range   Squamous Epithelial / LPF MANY (*) RARE   WBC, UA 3-6  <3 WBC/hpf   Bacteria, UA FEW (*) RARE   Urine-Other TRICHOMONAS PRESENT    WET PREP, GENITAL     Status: Abnormal   Collection Time   07/31/12  1:51 PM      Component Value  Range   Yeast Wet Prep HPF POC NONE SEEN  NONE SEEN   Trich, Wet Prep FEW (*) NONE SEEN   Clue Cells Wet Prep HPF POC NONE SEEN  NONE SEEN   WBC, Wet Prep HPF POC MODERATE (*) NONE SEEN  treated with Flagyl 2 gm GC/chlamydia pending Discussed with patient that partner needs to be treated and that GC and Chlamydia results will be reported to her if she needs treatment Assessment and Plan  Trichomonas vaginitis- treated in MAU  Clark Clowdus 07/31/2012, 1:50 PM

## 2012-07-31 NOTE — MAU Note (Signed)
Pt states she is having vaginal itching and discharge has a foul odor.

## 2012-08-01 LAB — GC/CHLAMYDIA PROBE AMP, GENITAL: GC Probe Amp, Genital: NEGATIVE

## 2012-11-25 ENCOUNTER — Emergency Department (INDEPENDENT_AMBULATORY_CARE_PROVIDER_SITE_OTHER)
Admission: EM | Admit: 2012-11-25 | Discharge: 2012-11-25 | Disposition: A | Discharge status: Home / Self Care | Payer: Medicaid Other | Source: Home / Self Care | Attending: Emergency Medicine | Admitting: Emergency Medicine

## 2012-11-25 ENCOUNTER — Encounter (HOSPITAL_COMMUNITY): Payer: Self-pay | Admitting: Emergency Medicine

## 2012-11-25 DIAGNOSIS — J029 Acute pharyngitis, unspecified: Secondary | ICD-10-CM

## 2012-11-25 MED ORDER — AMOXICILLIN 500 MG PO CAPS: 1000.0000 mg | ORAL_CAPSULE | Freq: Three times a day (TID) | ORAL | Status: DC

## 2012-11-25 NOTE — ED Provider Notes (Signed)
Chief Complaint  Patient presents with  . Otalgia    History of Present Illness:   Beverly Marquez  is a 22 year old female who presents today with her 2 young children. She presents with a one-week history of sore throat particularly on the left, aching in her ears, nasal congestion, rhinorrhea, feeling feverish and chilled, sneezing, and some loose stools. She denies any cough, nausea, or vomiting. She's been exposed to children who have a virus is. She takes Depakote for bipolar disorder. She denies any allergies.  Review of Systems:  Other than noted above, the patient denies any of the following symptoms. Systemic:  No fever, chills, sweats, fatigue, myalgias, headache, or anorexia. Eye:  No redness, pain or drainage. ENT:  No earache, ear congestion, nasal congestion, sneezing, rhinorrhea, sinus pressure, sinus pain, post nasal drip, or sore throat. Lungs:  No cough, sputum production, wheezing, shortness of breath, or chest pain. GI:  No abdominal pain, nausea, vomiting, or diarrhea.  PMFSH:  Past medical history, family history, social history, meds, and allergies were reviewed.  Physical Exam:   Vital signs:  BP 102/57  Pulse 79  Temp(Src) 98.3 F (36.8 C) (Oral)  Resp 16  SpO2 100% General:  Alert, in no distress. Eye:  No conjunctival injection or drainage. Lids were normal. ENT:  TMs and canals were normal, without erythema or inflammation.  Nasal mucosa was clear and uncongested, without drainage.  Mucous membranes were moist.  Pharynx was clear, without exudate or drainage.  There were no oral ulcerations or lesions. Neck:  Supple, no adenopathy, tenderness or mass. Lungs:  No respiratory distress.  Lungs were clear to auscultation, without wheezes, rales or rhonchi.  Breath sounds were clear and equal bilaterally.  Heart:  Regular rhythm, without gallops, murmers or rubs. Skin:  Clear, warm, and dry, without rash or lesions.  Labs:   Results for orders placed during  the hospital encounter of 11/25/12  POCT RAPID STREP A ([facility]ARE ONLY)      Result Value Range   Streptococcus, Group A Screen (Direct) NEGATIVE  NEGATIVE   Assessment:  The encounter diagnosis was Pharyngitis.  Associated with viral upper respiratory symptoms which have been going on for a week or more. This suggests a secondary infection such as sinusitis with postnasal drip as a cause for her ongoing pharyngitis.  Plan:   1.  The following meds were prescribed:   New Prescriptions   AMOXICILLIN (AMOXIL) 500 MG CAPSULE    Take 2 capsules (1,000 mg total) by mouth 3 (three) times daily.   2.  The patient was instructed in symptomatic care and handouts were given. 3.  The patient was told to return if becoming worse in any way, if no better in 3 or 4 days, and given some red flag symptoms that would indicate earlier return.   Reuben Likes, MD 11/25/12 [phone]

## 2012-11-25 NOTE — ED Notes (Signed)
Sore throat and ear pain, onset 4 days ago.  Has tried nyquil.

## 2012-11-25 NOTE — ED Notes (Signed)
Patient agitated with ride she says.  Patient verbally abusive, initially refused to take instructions from nursing.  Answered phone several times during discharge process

## 2012-11-30 ENCOUNTER — Encounter (HOSPITAL_COMMUNITY): Payer: Self-pay | Admitting: *Deleted

## 2012-11-30 ENCOUNTER — Emergency Department (HOSPITAL_COMMUNITY)
Admission: EM | Admit: 2012-11-30 | Discharge: 2012-11-30 | Disposition: A | Discharge status: Home / Self Care | Payer: Medicaid Other | Source: Home / Self Care | Attending: Emergency Medicine | Admitting: Emergency Medicine

## 2012-11-30 ENCOUNTER — Other Ambulatory Visit (HOSPITAL_COMMUNITY)
Admission: RE | Admit: 2012-11-30 | Discharge: 2012-11-30 | Disposition: A | Discharge status: Home / Self Care | Payer: Medicaid Other | Source: Ambulatory Visit | Attending: Emergency Medicine | Admitting: Emergency Medicine

## 2012-11-30 DIAGNOSIS — N760 Acute vaginitis: Secondary | ICD-10-CM | POA: Insufficient documentation

## 2012-11-30 DIAGNOSIS — B373 Candidiasis of vulva and vagina: Secondary | ICD-10-CM

## 2012-11-30 DIAGNOSIS — Z113 Encounter for screening for infections with a predominantly sexual mode of transmission: Secondary | ICD-10-CM | POA: Insufficient documentation

## 2012-11-30 LAB — POCT URINALYSIS DIP (DEVICE)
Bilirubin Urine: NEGATIVE
Nitrite: NEGATIVE
Urobilinogen, UA: 0.2 mg/dL (ref 0.0–1.0)
pH: 7 (ref 5.0–8.0)

## 2012-11-30 MED ORDER — FLUCONAZOLE 200 MG PO TABS: 200.0000 mg | ORAL_TABLET | Freq: Every day | ORAL | Status: DC

## 2012-11-30 MED ORDER — FLUCONAZOLE 200 MG PO TABS: 200.0000 mg | ORAL_TABLET | Freq: Every day | ORAL | Status: AC

## 2012-11-30 NOTE — ED Provider Notes (Signed)
Medical screening examination/treatment/procedure(s) were performed by non-physician practitioner and as supervising physician I was immediately available for consultation/collaboration.  Erma Joubert, M.D.  Keary Hanak C Yousif Edelson, MD 11/30/12 1950 

## 2012-11-30 NOTE — ED Provider Notes (Signed)
History     CSN: [id number]  Arrival date & time 11/30/12  1101   First MD Initiated Contact with Patient 11/30/12 1118      Chief Complaint  Patient presents with  . Vaginitis    HPI: Patient is a 22 y.o. female presenting with vaginal itching. The history is provided by the patient.  Vaginal Itching This is a new problem. The current episode started 12 to 24 hours ago. The problem occurs constantly. The problem has been gradually worsening. Nothing aggravates the symptoms. Nothing relieves the symptoms. She has tried nothing for the symptoms.  Pt reports onset of vaginal itching yesterday. Denies unusual vag d/c, fever, LBP or lower abd pain. Admits to some burning with urination but believes this is due to the external irritation. Admits to recent unprotected intercourse. Pt also reports she was recently placed on Amoxicillin for an URI. Has been on the Amoxicillin for approx 1 week.   Past Medical History  Diagnosis Date  . Trichomonas   . No pertinent past medical history   . Bipolar 1 disorder     Past Surgical History  Procedure Laterality Date  . No past surgeries      Family History  Problem Relation Age of Onset  . Other Neg Hx     History  Substance Use Topics  . Smoking status: Never Smoker   . Smokeless tobacco: Never Used  . Alcohol Use: No    OB History   Grav Para Term Preterm Abortions TAB SAB Ect Mult Living   2 2 2       2       Review of Systems  All other systems reviewed and are negative.    Allergies  Review of patient's allergies indicates no known allergies.  Home Medications   Current Outpatient Rx  Name  Route  Sig  Dispense  Refill  . amoxicillin (AMOXIL) 500 MG capsule   Oral   Take 2 capsules (1,000 mg total) by mouth 3 (three) times daily.   60 capsule   0     Dispense as written.   . Divalproex Sodium (DEPAKOTE PO)   Oral   Take by mouth.           BP 104/54  Pulse 93  Temp(Src) 97.8 F (36.6 C) (Oral)   Resp 17  SpO2 100%  LMP 11/27/2012  Physical Exam  Nursing note and vitals reviewed. Constitutional: She appears well-developed and well-nourished.  HENT:  Head: Normocephalic and atraumatic.  Eyes: Conjunctivae are normal.  Neck: Neck supple.  Cardiovascular: Normal rate.   Pulmonary/Chest: Effort normal.  Abdominal: Hernia confirmed negative in the right inguinal area and confirmed negative in the left inguinal area.  Genitourinary: Uterus normal. There is no rash, tenderness, lesion or injury on the right labia. There is no rash, tenderness, lesion or injury on the left labia. Cervix exhibits no motion tenderness, no discharge and no friability. Right adnexum displays no mass, no tenderness and no fullness. Left adnexum displays no mass, no tenderness and no fullness. There is erythema around the vagina. No tenderness or bleeding around the vagina. No foreign body around the vagina. No signs of injury around the vagina. Vaginal discharge found.  Musculoskeletal: Normal range of motion.  Lymphadenopathy:       Right: No inguinal adenopathy present.       Left: No inguinal adenopathy present.  Neurological: She is alert.  Skin: Skin is warm and dry.    ED Course  Pelvic exam Date/Time: 11/30/2012 12:12 PM Performed by: Leanne Chang Authorized by: Leslee Home C Consent: Verbal consent obtained. Risks and benefits: risks, benefits and alternatives were discussed Consent given by: patient Patient understanding: patient states understanding of the procedure being performed Required items: required blood products, implants, devices, and special equipment available Patient identity confirmed: verbally with patient and arm band Preparation: Patient was prepped and draped in the usual sterile fashion. Local anesthesia used: no Patient sedated: no Patient tolerance: Patient tolerated the procedure well with no immediate complications.   (including critical care time)  Labs  Reviewed  POCT URINALYSIS DIP (DEVICE) - Abnormal; Notable for the following:    Hgb urine dipstick TRACE (*)    Protein, ur 30 (*)    Leukocytes, UA TRACE (*)    All other components within normal limits  POCT PREGNANCY, URINE  CERVICOVAGINAL ANCILLARY ONLY   No results found.   No diagnosis found.    MDM  Vaginal itching x 24 hrs. Pt on Amoxicillin x 1 week. + unprotected intercourse. Vag d/c c/w vaginal candidiasis. Low index of suspicion for other STD's per exam. Will treat w/ Diflucan.  Info on safer sex provided.        Leanne Chang, NP 11/30/12 1214

## 2012-11-30 NOTE — ED Notes (Signed)
Patient states she thinks she has a yeast infection. Complains of itching and burning sensation while voiding x 1 day.

## 2012-12-09 ENCOUNTER — Telehealth (HOSPITAL_COMMUNITY): Payer: Self-pay | Admitting: Emergency Medicine

## 2012-12-09 MED ORDER — AZITHROMYCIN 500 MG PO TABS: 1000.0000 mg | ORAL_TABLET | Freq: Every day | ORAL | Status: DC

## 2012-12-09 NOTE — Telephone Encounter (Signed)
Message copied by Reuben Likes on Tue Dec 09, 2012  9:53 PM ------      Message from: Vassie Moselle      Created: Tue Dec 09, 2012  9:21 PM      Regarding: labs       Sent message but no order obtained. Need order for Chlamydia.  Thanks.      Vassie Moselle      12/09/2012            ----- Message -----         From: Vassie Moselle, RN         Sent: 12/02/2012   4:11 PM           To: Roma Kayser Schorr, NP            Chlamyia pos. and Candida pos. I saw the order for Diflucan but not Zithromax.  Enter order and send me a message. I will call the pt.then send the form to [facility].  Let me know if you e- prescribe or have to print it.      Vassie Moselle      12/02/2012             ------

## 2012-12-09 NOTE — Telephone Encounter (Signed)
Message copied by Reuben Likes on Tue Dec 09, 2012  9:54 PM ------      Message from: Vassie Moselle      Created: Tue Dec 09, 2012  9:21 PM      Regarding: labs       Sent message but no order obtained. Need order for Chlamydia.  Thanks.      Vassie Moselle      12/09/2012            ----- Message -----         From: Vassie Moselle, RN         Sent: 12/02/2012   4:11 PM           To: Roma Kayser Schorr, NP            Chlamyia pos. and Candida pos. I saw the order for Diflucan but not Zithromax.  Enter order and send me a message. I will call the pt.then send the form to [facility].  Let me know if you e- prescribe or have to print it.      Vassie Moselle      12/02/2012             ------

## 2012-12-09 NOTE — ED Notes (Signed)
Patient's DNA probe came back positive for Chlamydia and Candida. She was given Diflucan. She needs azithromycin 1000 mg by mouth. We'll report to the [facility] health Department.  Reuben Likes, MD 12/09/12 2154

## 2012-12-11 ENCOUNTER — Telehealth (HOSPITAL_COMMUNITY): Payer: Self-pay | Admitting: *Deleted

## 2012-12-11 NOTE — ED Notes (Signed)
I called pt.  Pt. verified x 2 and given results. Pt. told she has a Rx. of Zithromax @ [facility] on [address] and was adequately treated with Diflucan for Candida.  Pt. instructed to notify her partner, no sex for 1 week and to practice safe sex. Pt. told they can get HIV testing at the [facility]. STD clinic, by appointment.  Pt. voiced understanding.  DHHS form completed and faxed to the [facility].

## 2013-02-12 ENCOUNTER — Other Ambulatory Visit (HOSPITAL_COMMUNITY)
Admission: RE | Admit: 2013-02-12 | Discharge: 2013-02-12 | Disposition: A | Discharge status: Home / Self Care | Payer: Medicaid Other | Source: Ambulatory Visit | Attending: Family Medicine | Admitting: Family Medicine

## 2013-02-12 ENCOUNTER — Emergency Department (HOSPITAL_COMMUNITY)
Admission: EM | Admit: 2013-02-12 | Discharge: 2013-02-12 | Disposition: A | Discharge status: Home / Self Care | Payer: Medicaid Other | Source: Home / Self Care | Attending: Family Medicine | Admitting: Family Medicine

## 2013-02-12 ENCOUNTER — Encounter (HOSPITAL_COMMUNITY): Payer: Self-pay | Admitting: Emergency Medicine

## 2013-02-12 DIAGNOSIS — N760 Acute vaginitis: Secondary | ICD-10-CM

## 2013-02-12 DIAGNOSIS — Z113 Encounter for screening for infections with a predominantly sexual mode of transmission: Secondary | ICD-10-CM | POA: Insufficient documentation

## 2013-02-12 LAB — POCT URINALYSIS DIP (DEVICE)
Bilirubin Urine: NEGATIVE
Nitrite: NEGATIVE
pH: 7 (ref 5.0–8.0)

## 2013-02-12 NOTE — ED Notes (Signed)
Patient concerned for std, reports minimal vaginal discharge, does report an odor and "something just doesn't feel right"reports pain in back.  Denies urinary symptoms

## 2013-02-12 NOTE — ED Notes (Signed)
Pelvic equipment at bedside 

## 2013-02-12 NOTE — ED Provider Notes (Signed)
History     CSN: [id number]  Arrival date & time 02/12/13  1053   First MD Initiated Contact with Patient 02/12/13 1237      Chief Complaint  Patient presents with  . Vaginal Discharge    (Consider location/radiation/quality/duration/timing/severity/associated sxs/prior treatment) Patient is a 22 y.o. female presenting with vaginal discharge. The history is provided by the patient.  Vaginal Discharge This is a new problem. Episode onset: 4 days. The problem occurs constantly. The problem has not changed since onset.Pertinent negatives include no abdominal pain. Nothing aggravates the symptoms. Nothing relieves the symptoms. She has tried nothing for the symptoms.    Past Medical History  Diagnosis Date  . Trichomonas   . No pertinent past medical history   . Bipolar 1 disorder     Past Surgical History  Procedure Laterality Date  . No past surgeries      Family History  Problem Relation Age of Onset  . Other Neg Hx     History  Substance Use Topics  . Smoking status: Never Smoker   . Smokeless tobacco: Never Used  . Alcohol Use: No    OB History   Grav Para Term Preterm Abortions TAB SAB Ect Mult Living   2 2 2       2       Review of Systems  Constitutional: Negative for fever and chills.  Gastrointestinal: Negative for nausea, vomiting and abdominal pain.  Genitourinary: Positive for vaginal discharge. Negative for dysuria, flank pain and genital sores.    Allergies  Review of patient's allergies indicates no known allergies.  Home Medications   Current Outpatient Rx  Name  Route  Sig  Dispense  Refill  . Divalproex Sodium (DEPAKOTE PO)   Oral   Take by mouth.           BP 131/72  Pulse 65  Temp(Src) 98.7 F (37.1 C) (Oral)  Resp 16  SpO2 99%  Breastfeeding? No  Physical Exam  Constitutional: She appears well-developed and well-nourished. No distress.  Cardiovascular: Normal rate and regular rhythm.   Pulmonary/Chest: Effort normal and  breath sounds normal.  Abdominal: Soft. Bowel sounds are normal. She exhibits no distension. There is no tenderness. There is no rigidity, no rebound, no guarding and no CVA tenderness.  Genitourinary: Uterus normal. There is no rash, tenderness or lesion on the right labia. There is no rash, tenderness or lesion on the left labia. Cervix exhibits no motion tenderness, no discharge and no friability. Right adnexum displays no mass, no tenderness and no fullness. Left adnexum displays no mass, no tenderness and no fullness. No erythema or tenderness around the vagina. No foreign body around the vagina. Vaginal discharge found.  Discharge is moderately thick and white, no odor  Lymphadenopathy:       Right: No inguinal adenopathy present.       Left: No inguinal adenopathy present.    ED Course  Procedures (including critical care time)  Labs Reviewed  POCT URINALYSIS DIP (DEVICE) - Abnormal; Notable for the following:    Hgb urine dipstick TRACE (*)    Leukocytes, UA TRACE (*)    All other components within normal limits  POCT PREGNANCY, URINE  CERVICOVAGINAL ANCILLARY ONLY   No results found.   1. Vaginitis       MDM  Will await test results.         Cathlyn Parsons, NP 02/12/13 1326

## 2013-02-13 NOTE — ED Provider Notes (Signed)
Medical screening examination/treatment/procedure(s) were performed by non-physician practitioner and as supervising physician I was immediately available for consultation/collaboration.   MORENO-COLL,Gesselle Fitzsimons; MD  Makai Agostinelli Moreno-Coll, MD 02/13/13 1024 

## 2013-02-18 MED ORDER — FLUCONAZOLE 150 MG PO TABS: ORAL_TABLET | ORAL | Status: DC

## 2013-02-19 ENCOUNTER — Telehealth (HOSPITAL_COMMUNITY): Payer: Self-pay | Admitting: *Deleted

## 2013-02-19 NOTE — ED Notes (Signed)
5/5 GC/Chlamydia neg.,  Affirm: Candida pos., Gardnerella and Trich neg.  Message sent to Dr. Tressia Danas.  5/7  Diflucan e- prescribed to the [facility] on [address] by Dr. Tressia Danas.  5/8  I called pt.  Pt. verified x 2 and given results.  Pt. told she has about her Rx. and where to pick it up. Pt. voiced understanding. Vassie Moselle 02/19/2013

## 2013-02-24 ENCOUNTER — Encounter: Payer: Medicaid Other | Admitting: *Deleted

## 2013-02-24 NOTE — Progress Notes (Signed)
This encounter was created in error - please disregard.

## 2013-02-25 ENCOUNTER — Ambulatory Visit: Payer: Medicaid Other

## 2013-03-02 ENCOUNTER — Ambulatory Visit (INDEPENDENT_AMBULATORY_CARE_PROVIDER_SITE_OTHER): Payer: Medicaid Other | Admitting: Obstetrics

## 2013-03-02 VITALS — BP 132/71 | HR 65 | Temp 97.1°F | Wt 170.0 lb

## 2013-03-02 DIAGNOSIS — Z3049 Encounter for surveillance of other contraceptives: Secondary | ICD-10-CM

## 2013-03-02 MED ORDER — MEDROXYPROGESTERONE ACETATE 150 MG/ML IM SUSP
150.0000 mg | Freq: Once | INTRAMUSCULAR | Status: AC
  Administered 2013-03-02: 150 mg via INTRAMUSCULAR

## 2013-03-02 NOTE — Progress Notes (Signed)
Depo injection given. Pt tolerated well. 

## 2013-03-02 NOTE — Patient Instructions (Signed)
Please return to office as schedule and as needed. Please call pharmacy for refills and pick-up prescription before next appointment.  

## 2013-04-27 ENCOUNTER — Encounter: Payer: Self-pay | Admitting: Obstetrics

## 2013-04-27 ENCOUNTER — Ambulatory Visit (INDEPENDENT_AMBULATORY_CARE_PROVIDER_SITE_OTHER): Payer: Medicaid Other | Admitting: Obstetrics

## 2013-04-27 VITALS — BP 123/70 | HR 82 | Temp 98.0°F | Wt 154.0 lb

## 2013-04-27 DIAGNOSIS — N760 Acute vaginitis: Secondary | ICD-10-CM

## 2013-04-27 DIAGNOSIS — Z0000 Encounter for general adult medical examination without abnormal findings: Secondary | ICD-10-CM

## 2013-04-27 DIAGNOSIS — Z113 Encounter for screening for infections with a predominantly sexual mode of transmission: Secondary | ICD-10-CM

## 2013-04-27 DIAGNOSIS — Z01419 Encounter for gynecological examination (general) (routine) without abnormal findings: Secondary | ICD-10-CM

## 2013-04-27 NOTE — Progress Notes (Signed)
Subjective:     Beverly Marquez is a 22 y.o. female here for a routine exam with STD screen.  Current complaints: no complaints.  Personal health questionnaire reviewed: not asked.   Gynecologic History No LMP recorded. Patient has had an injection. Contraception: Depo-Provera injections Last Pap: n/a. Results were: n/a Last mammogram: n/a. Results were: n/a  Obstetric History OB History   Grav Para Term Preterm Abortions TAB SAB Ect Mult Living   2 2 2       2      # Outc Date GA Lbr Len/2nd Wgt Sex Del Anes PTL Lv   1 TRM 9/11    F SVD EPI No    2 TRM 1/13 [redacted]w[redacted]d 10:45 / 00:11 6lb14oz(3.118kg) F SVD EPI         The following portions of the patient's history were reviewed and updated as appropriate: allergies, current medications, past family history, past medical history, past social history, past surgical history and problem list.  Review of Systems Pertinent items are noted in HPI.    Objective:    General appearance: alert and no distress Breasts: normal appearance, no masses or tenderness Abdomen: normal findings: soft, non-tender Pelvic: cervix normal in appearance, external genitalia normal, no adnexal masses or tenderness, no cervical motion tenderness, uterus normal size, shape, and consistency and vagina normal without discharge    Assessment:    Healthy female exam.    Plan:    Education reviewed: safe sex/STD prevention. Contraception: Depo-Provera injections. Follow up in: 1 year.

## 2013-04-28 ENCOUNTER — Other Ambulatory Visit: Payer: Self-pay | Admitting: *Deleted

## 2013-04-28 DIAGNOSIS — B379 Candidiasis, unspecified: Secondary | ICD-10-CM

## 2013-04-28 LAB — HEPATITIS C ANTIBODY: HCV Ab: NEGATIVE

## 2013-04-28 LAB — PAP IG W/ RFLX HPV ASCU

## 2013-04-28 LAB — HIV ANTIBODY (ROUTINE TESTING W REFLEX): HIV: NONREACTIVE

## 2013-04-28 LAB — WET PREP BY MOLECULAR PROBE: Trichomonas vaginosis: NEGATIVE

## 2013-04-28 LAB — GC/CHLAMYDIA PROBE AMP
CT Probe RNA: NEGATIVE
GC Probe RNA: NEGATIVE

## 2013-04-28 MED ORDER — FLUCONAZOLE 150 MG PO TABS: 150.0000 mg | ORAL_TABLET | Freq: Once | ORAL | Status: DC

## 2013-05-25 ENCOUNTER — Ambulatory Visit: Payer: Medicaid Other

## 2013-05-26 ENCOUNTER — Ambulatory Visit (INDEPENDENT_AMBULATORY_CARE_PROVIDER_SITE_OTHER): Payer: Medicaid Other | Admitting: *Deleted

## 2013-05-26 VITALS — BP 116/73 | HR 73 | Temp 98.4°F | Wt 172.0 lb

## 2013-05-26 DIAGNOSIS — Z309 Encounter for contraceptive management, unspecified: Secondary | ICD-10-CM

## 2013-05-26 DIAGNOSIS — IMO0001 Reserved for inherently not codable concepts without codable children: Secondary | ICD-10-CM

## 2013-05-26 MED ORDER — MEDROXYPROGESTERONE ACETATE 150 MG/ML IM SUSP
150.0000 mg | INTRAMUSCULAR | Status: AC
  Administered 2013-05-26 – 2014-05-07 (×4): 150 mg via INTRAMUSCULAR

## 2013-05-26 NOTE — Progress Notes (Signed)
Pt in office today for a Depo injection. Pt is on time for her injection.

## 2013-07-16 ENCOUNTER — Emergency Department (HOSPITAL_COMMUNITY): Admission: EM | Admit: 2013-07-16 | Discharge: 2013-07-16 | Payer: Medicaid Other | Source: Home / Self Care

## 2013-07-16 NOTE — ED Notes (Signed)
Per Madelaine Bhat, awaiting Medicaid auth.

## 2013-08-21 ENCOUNTER — Other Ambulatory Visit: Payer: Self-pay | Admitting: *Deleted

## 2013-08-21 DIAGNOSIS — IMO0001 Reserved for inherently not codable concepts without codable children: Secondary | ICD-10-CM

## 2013-08-21 MED ORDER — MEDROXYPROGESTERONE ACETATE 150 MG/ML IM SUSP: 150.0000 mg | INTRAMUSCULAR | Status: DC

## 2013-08-24 ENCOUNTER — Ambulatory Visit: Payer: Medicaid Other

## 2013-08-26 ENCOUNTER — Ambulatory Visit (INDEPENDENT_AMBULATORY_CARE_PROVIDER_SITE_OTHER): Payer: Medicaid Other | Admitting: *Deleted

## 2013-08-26 ENCOUNTER — Encounter: Payer: Self-pay | Admitting: Obstetrics

## 2013-08-26 VITALS — BP 110/69 | HR 96 | Temp 98.1°F | Ht 64.0 in | Wt 186.0 lb

## 2013-08-26 DIAGNOSIS — IMO0001 Reserved for inherently not codable concepts without codable children: Secondary | ICD-10-CM

## 2013-08-26 DIAGNOSIS — Z309 Encounter for contraceptive management, unspecified: Secondary | ICD-10-CM

## 2013-08-26 NOTE — Progress Notes (Signed)
Pt in office for depo injection. 

## 2013-09-30 ENCOUNTER — Inpatient Hospital Stay (HOSPITAL_COMMUNITY): Payer: Medicaid Other

## 2013-09-30 ENCOUNTER — Encounter (HOSPITAL_COMMUNITY): Payer: Self-pay | Admitting: General Practice

## 2013-09-30 ENCOUNTER — Inpatient Hospital Stay (HOSPITAL_COMMUNITY)
Admission: AD | Admit: 2013-09-30 | Discharge: 2013-09-30 | Disposition: A | Discharge status: Home / Self Care | Payer: Medicaid Other | Source: Ambulatory Visit | Attending: Obstetrics & Gynecology | Admitting: Obstetrics & Gynecology

## 2013-09-30 DIAGNOSIS — R109 Unspecified abdominal pain: Secondary | ICD-10-CM | POA: Insufficient documentation

## 2013-09-30 DIAGNOSIS — Z3202 Encounter for pregnancy test, result negative: Secondary | ICD-10-CM | POA: Insufficient documentation

## 2013-09-30 DIAGNOSIS — N949 Unspecified condition associated with female genital organs and menstrual cycle: Secondary | ICD-10-CM | POA: Insufficient documentation

## 2013-09-30 DIAGNOSIS — K5900 Constipation, unspecified: Secondary | ICD-10-CM | POA: Insufficient documentation

## 2013-09-30 LAB — URINE MICROSCOPIC-ADD ON

## 2013-09-30 LAB — URINALYSIS, ROUTINE W REFLEX MICROSCOPIC
Glucose, UA: NEGATIVE mg/dL
Leukocytes, UA: NEGATIVE
Nitrite: NEGATIVE
Protein, ur: NEGATIVE mg/dL
Specific Gravity, Urine: 1.025 (ref 1.005–1.030)
pH: 6.5 (ref 5.0–8.0)

## 2013-09-30 LAB — CBC WITH DIFFERENTIAL/PLATELET
Basophils Absolute: 0 10*3/uL (ref 0.0–0.1)
Basophils Relative: 0 % (ref 0–1)
HCT: 36.3 % (ref 36.0–46.0)
Hemoglobin: 12.4 g/dL (ref 12.0–15.0)
Lymphocytes Relative: 21 % (ref 12–46)
Lymphs Abs: 1.3 10*3/uL (ref 0.7–4.0)
MCV: 92.4 fL (ref 78.0–100.0)
Monocytes Absolute: 1.1 10*3/uL — ABNORMAL HIGH (ref 0.1–1.0)
Neutro Abs: 3.9 10*3/uL (ref 1.7–7.7)
Neutrophils Relative %: 61 % (ref 43–77)
RBC: 3.93 MIL/uL (ref 3.87–5.11)
RDW: 13.7 % (ref 11.5–15.5)
WBC: 6.4 10*3/uL (ref 4.0–10.5)

## 2013-09-30 LAB — WET PREP, GENITAL
Clue Cells Wet Prep HPF POC: NONE SEEN
Trich, Wet Prep: NONE SEEN

## 2013-09-30 LAB — POCT PREGNANCY, URINE: Preg Test, Ur: NEGATIVE

## 2013-09-30 NOTE — MAU Provider Note (Signed)
History     CSN: [id number]  Arrival date and time: 09/30/13 1348   First Provider Initiated Contact with Patient 09/30/13 1434      Chief Complaint  Patient presents with  . Pelvic Pain   HPI Beverly Marquez is 22 y.o. [id number] Unknown weeks presenting with abdominal pain began left and now both sides X 1 week.  The pain was intermittent but today it became constant.  LMP ? On Depo. Next injection due in Feb 2015.  Denies fever, chills, nausea or vomiting.  States she has not had an appetite today. Last ate at noon-bowl of cereal. Has been constipated lately. 1 sexual partner, last intercourse 1 week ago.  Last Gc/CHL in July was negative.  Did not call Dr. Clearance Coots.    Past Medical History  Diagnosis Date  . Trichomonas   . No pertinent past medical history   . Bipolar 1 disorder     Past Surgical History  Procedure Laterality Date  . No past surgeries      Family History  Problem Relation Age of Onset  . Other Neg Hx     History  Substance Use Topics  . Smoking status: Former Smoker    Types: Cigarettes  . Smokeless tobacco: Never Used  . Alcohol Use: No    Allergies: No Known Allergies  Facility-administered medications prior to admission  Medication Dose Route Frequency Provider Last Rate Last Dose  . medroxyPROGESTERone (DEPO-PROVERA) injection 150 mg  150 mg Intramuscular Q90 days Brock Bad, MD   150 mg at 08/26/13 1328   Prescriptions prior to admission  Medication Sig Dispense Refill  . Divalproex Sodium (DEPAKOTE PO) Take by mouth.      . medroxyPROGESTERone (DEPO-PROVERA) 150 MG/ML injection Inject 1 mL (150 mg total) into the muscle every 3 (three) months.  1 mL  3  . Prenatal Vit-Fe Fumarate-FA (PRENATAL 19 PO) Take 1 tablet by mouth daily.        Review of Systems  Constitutional: Negative for fever and chills.  Gastrointestinal: Positive for abdominal pain and constipation. Negative for nausea, vomiting and diarrhea.  Genitourinary:  Negative for dysuria, urgency, frequency and hematuria.       Neg for vaginal discharge or bleeding   Physical Exam   Blood pressure 119/75, pulse 114, temperature 99.4 F (37.4 C), temperature source Oral, resp. rate 16, height 5' 5.5" (1.664 m), weight 182 lb (82.555 kg), SpO2 98.00%.  Physical Exam  Constitutional: She is oriented to person, place, and time. She appears well-developed and well-nourished. No distress.  HENT:  Head: Normocephalic.  Neck: Normal range of motion.  Respiratory: Effort normal.  GI: She exhibits no distension and no mass. There is tenderness (bilateral mild tenderness lower pelvis). There is no rebound and no guarding.  Genitourinary: There is no rash, tenderness or lesion on the right labia. There is no rash, tenderness or lesion on the left labia. Uterus is not enlarged and not tender. Cervix exhibits no motion tenderness, no discharge and no friability. Right adnexum displays tenderness (mild). Right adnexum displays no mass and no fullness. Left adnexum displays tenderness (mild). Left adnexum displays no mass and no fullness. No erythema or bleeding around the vagina. Vaginal discharge (small amount of white discharge without bleeding) found.  Neurological: She is alert and oriented to person, place, and time.  Skin: Skin is warm and dry.  Psychiatric: She has a normal mood and affect. Her behavior is normal. Thought content normal.  Results for orders placed during the hospital encounter of 09/30/13 (from the past 24 hour(s))  URINALYSIS, ROUTINE W REFLEX MICROSCOPIC     Status: Abnormal   Collection Time    09/30/13  2:15 PM      Result Value Range   Color, Urine YELLOW  YELLOW   APPearance CLEAR  CLEAR   Specific Gravity, Urine 1.025  1.005 - 1.030   pH 6.5  5.0 - 8.0   Glucose, UA NEGATIVE  NEGATIVE mg/dL   Hgb urine dipstick TRACE (*) NEGATIVE   Bilirubin Urine NEGATIVE  NEGATIVE   Ketones, ur NEGATIVE  NEGATIVE mg/dL   Protein, ur NEGATIVE   NEGATIVE mg/dL   Urobilinogen, UA 0.2  0.0 - 1.0 mg/dL   Nitrite NEGATIVE  NEGATIVE   Leukocytes, UA NEGATIVE  NEGATIVE  URINE MICROSCOPIC-ADD ON     Status: Abnormal   Collection Time    09/30/13  2:15 PM      Result Value Range   Squamous Epithelial / LPF FEW (*) RARE   WBC, UA 0-2  <3 WBC/hpf   RBC / HPF 3-6  <3 RBC/hpf   Bacteria, UA FEW (*) RARE   Urine-Other MUCOUS PRESENT    CBC WITH DIFFERENTIAL     Status: Abnormal   Collection Time    09/30/13  2:37 PM      Result Value Range   WBC 6.4  4.0 - 10.5 K/uL   RBC 3.93  3.87 - 5.11 MIL/uL   Hemoglobin 12.4  12.0 - 15.0 g/dL   HCT 16.1  09.6 - 04.5 %   MCV 92.4  78.0 - 100.0 fL   MCH 31.6  26.0 - 34.0 pg   MCHC 34.2  30.0 - 36.0 g/dL   RDW 40.9  81.1 - 91.4 %   Platelets 318  150 - 400 K/uL   Neutrophils Relative % 61  43 - 77 %   Neutro Abs 3.9  1.7 - 7.7 K/uL   Lymphocytes Relative 21  12 - 46 %   Lymphs Abs 1.3  0.7 - 4.0 K/uL   Monocytes Relative 18 (*) 3 - 12 %   Monocytes Absolute 1.1 (*) 0.1 - 1.0 K/uL   Eosinophils Relative 1  0 - 5 %   Eosinophils Absolute 0.0  0.0 - 0.7 K/uL   Basophils Relative 0  0 - 1 %   Basophils Absolute 0.0  0.0 - 0.1 K/uL  POCT PREGNANCY, URINE     Status: None   Collection Time    09/30/13  2:43 PM      Result Value Range   Preg Test, Ur NEGATIVE  NEGATIVE   US Transvaginal Non-ob  09/30/2013   CLINICAL DATA:  Bilateral pelvic pain. On Depo-Provera. Negative pregnancy test.  EXAM: TRANSABDOMINAL AND TRANSVAGINAL ULTRASOUND OF PELVIS  TECHNIQUE: Both transabdominal and transvaginal ultrasound examinations of the pelvis were performed. Transabdominal technique was performed for global imaging of the pelvis including uterus, ovaries, adnexal regions, and pelvic cul-de-sac. It was necessary to proceed with endovaginal exam following the transabdominal exam to visualize the endometrium and ovaries.  COMPARISON:  None  FINDINGS: Uterus  Measurements: 7.5 x 3.6 x 5.3 cm. No fibroids or other  mass visualized.  Endometrium  Thickness: 7 mm.  No focal abnormality visualized.  Right ovary  Measurements: 2.4 x 2.9 x 1.9 cm. Normal appearance/no adnexal mass.  Left ovary  Measurements: 1.9 x 1.2 x 2.1 cm. Normal appearance/no adnexal mass.  Other findings  No free fluid.  IMPRESSION: Negative. No pelvic mass or other significant abnormality identified.   Electronically Signed   By: Myles Rosenthal M.D.   On: 09/30/2013 15:56   US Pelvis Complete  09/30/2013   CLINICAL DATA:  Bilateral pelvic pain. On Depo-Provera. Negative pregnancy test.  EXAM: TRANSABDOMINAL AND TRANSVAGINAL ULTRASOUND OF PELVIS  TECHNIQUE: Both transabdominal and transvaginal ultrasound examinations of the pelvis were performed. Transabdominal technique was performed for global imaging of the pelvis including uterus, ovaries, adnexal regions, and pelvic cul-de-sac. It was necessary to proceed with endovaginal exam following the transabdominal exam to visualize the endometrium and ovaries.  COMPARISON:  None  FINDINGS: Uterus  Measurements: 7.5 x 3.6 x 5.3 cm. No fibroids or other mass visualized.  Endometrium  Thickness: 7 mm.  No focal abnormality visualized.  Right ovary  Measurements: 2.4 x 2.9 x 1.9 cm. Normal appearance/no adnexal mass.  Left ovary  Measurements: 1.9 x 1.2 x 2.1 cm. Normal appearance/no adnexal mass.  Other findings  No free fluid.  IMPRESSION: Negative. No pelvic mass or other significant abnormality identified.   Electronically Signed   By: Myles Rosenthal M.D.   On: 09/30/2013 15:56  MAU Course  Procedures  GC/CHLculture   MDM Reported MSE, labs and U/S with Dr. Tamela Oddi.  Per Dr. Tamela Oddi have patient follow up with Dr. Clearance Coots.  There are no labs that point to appendicitis, ovarian cysts or vaginal infections at this time.  Cultures are pending  Assessment and Plan  A:  Abdominal pain      Negative pregnancy test     Depo Provera contraception    Constipation  P:  Instructed patient to avoid  constipation by staying well hydrated.    Avoid straining    Return for worsening sxs    Follow up with Dr. Brandon Melnick appt  Jeani Sow M 09/30/2013, 2:35 PM

## 2013-09-30 NOTE — MAU Note (Signed)
Pt presents to MAU with c/o pelvic pain since last week. States it started on left side but this week the pain is on both sides of her abdomen

## 2013-09-30 NOTE — MAU Note (Signed)
Patient states she started having pelvic pain last week but is getting worse. Denies bleeding, or discharge, no nausea, vomiting or diarrhea. Does not have periods, is on Depo. Next one due February.

## 2013-11-12 ENCOUNTER — Other Ambulatory Visit: Payer: Self-pay | Admitting: *Deleted

## 2013-11-12 DIAGNOSIS — IMO0001 Reserved for inherently not codable concepts without codable children: Secondary | ICD-10-CM

## 2013-11-12 MED ORDER — MEDROXYPROGESTERONE ACETATE 150 MG/ML IM SUSP: 150.0000 mg | INTRAMUSCULAR | Status: DC

## 2013-11-17 ENCOUNTER — Ambulatory Visit: Payer: Medicaid Other

## 2013-11-18 ENCOUNTER — Ambulatory Visit (INDEPENDENT_AMBULATORY_CARE_PROVIDER_SITE_OTHER): Payer: Medicaid Other | Admitting: *Deleted

## 2013-11-18 VITALS — BP 112/72 | HR 80 | Temp 98.6°F | Ht 65.0 in | Wt 181.0 lb

## 2013-11-18 DIAGNOSIS — Z3049 Encounter for surveillance of other contraceptives: Secondary | ICD-10-CM

## 2013-11-18 NOTE — Progress Notes (Signed)
Patient in office today for Depo shot. Depo given in right arm.

## 2014-01-30 ENCOUNTER — Emergency Department (HOSPITAL_COMMUNITY)
Admission: EM | Admit: 2014-01-30 | Discharge: 2014-01-30 | Disposition: A | Discharge status: Home / Self Care | Payer: Medicaid Other | Source: Home / Self Care | Attending: Family Medicine | Admitting: Family Medicine

## 2014-01-30 ENCOUNTER — Encounter (HOSPITAL_COMMUNITY): Payer: Self-pay | Admitting: Emergency Medicine

## 2014-01-30 DIAGNOSIS — M67439 Ganglion, unspecified wrist: Secondary | ICD-10-CM

## 2014-01-30 DIAGNOSIS — M6740 Ganglion, unspecified site: Secondary | ICD-10-CM

## 2014-01-30 MED ORDER — METHYLPREDNISOLONE ACETATE 40 MG/ML IJ SUSP
40.0000 mg | Freq: Once | INTRAMUSCULAR | Status: AC
  Administered 2014-01-30: 40 mg via INTRA_ARTICULAR

## 2014-01-30 MED ORDER — METHYLPREDNISOLONE ACETATE 40 MG/ML IJ SUSP
INTRAMUSCULAR | Status: AC
  Filled 2014-01-30: qty 5

## 2014-01-30 NOTE — ED Notes (Signed)
Reports cyst on right hand x 1 yr.   Having pain.   Mild swelling.

## 2014-01-30 NOTE — Discharge Instructions (Signed)
Thank you for coming in today. This cyst should go down over the next several days to weeks. If he does not improve or if it worsens please followup with Dr. Merlyn LotKuzma at the hand surgery clinic. Call or go to the ER if you develop a large red swollen joint with extreme pain or oozing puss.   Ganglion Cyst A ganglion cyst is a noncancerous, fluid-filled lump that occurs near joints or tendons. The ganglion cyst grows out of a joint or the lining of a tendon. It most often develops in the hand or wrist but can also develop in the shoulder, elbow, hip, knee, ankle, or foot. The round or oval ganglion can be pea sized or larger than a grape. Increased activity may enlarge the size of the cyst because more fluid starts to build up.  CAUSES  It is not completely known what causes a ganglion cyst to grow. However, it may be related to:  Inflammation or irritation around the joint.  An injury.  Repetitive movements or overuse.  Arthritis. SYMPTOMS  A lump most often appears in the hand or wrist, but can occur in other areas of the body. Generally, the lump is painless without other symptoms. However, sometimes pain can be felt during activity or when pressure is applied to the lump. The lump may even be tender to the touch. Tingling, pain, numbness, or muscle weakness can occur if the ganglion cyst presses on a nerve. Your grip may be weak and you may have less movement in your joints.  DIAGNOSIS  Ganglion cysts are most often diagnosed based on a physical exam, noting where the cyst is and how it looks. Your caregiver will feel the lump and may shine a light alongside it. If it is a ganglion, a light often shines through it. Your caregiver may order an X-ray, ultrasound, or MRI to rule out other conditions. TREATMENT  Ganglions usually go away on their own without treatment. If pain or other symptoms are involved, treatment may be needed. Treatment is also needed if the ganglion limits your movement or  if it gets infected. Treatment options include:  Wearing a wrist or finger brace or splint.  Taking anti-inflammatory medicine.  Draining fluid from the lump with a needle (aspiration).  Injecting a steroid into the joint.  Surgery to remove the ganglion cyst and its stalk that is attached to the joint or tendon. However, ganglion cysts can grow back. HOME CARE INSTRUCTIONS   Do not press on the ganglion, poke it with a needle, or hit it with a heavy object. You may rub the lump gently and often. Sometimes fluid moves out of the cyst.  Only take medicines as directed by your caregiver.  Wear your brace or splint as directed by your caregiver. SEEK MEDICAL CARE IF:   Your ganglion becomes larger or more painful.  You have increased redness, red streaks, or swelling.  You have pus coming from the lump.  You have weakness or numbness in the affected area. MAKE SURE YOU:   Understand these instructions.  Will watch your condition.  Will get help right away if you are not doing well or get worse. Document Released: 09/28/2000 Document Revised: 06/25/2012 Document Reviewed: 11/25/2007 [facility] Patient Information 2014 [employer], [place].

## 2014-01-30 NOTE — ED Provider Notes (Signed)
Beverly Marquez Romeo AppleHarrison is a 23 y.o. female who presents to Urgent Care today for right dorsal wrist ganglion cyst. Present for about one year. Recently becoming bothersome. No injury. No radiating pain weakness or numbness. No fevers or chills nausea vomiting or diarrhea. No treatments tried.   Past Medical History  Diagnosis Date  . Trichomonas   . No pertinent past medical history   . Bipolar 1 disorder    History  Substance Use Topics  . Smoking status: Former Smoker    Types: Cigarettes  . Smokeless tobacco: Never Used  . Alcohol Use: No   ROS as above Medications: Current Facility-Administered Medications  Medication Dose Route Frequency Provider Last Rate Last Dose  . medroxyPROGESTERone (DEPO-PROVERA) injection 150 mg  150 mg Intramuscular Q90 days Brock Badharles A Harper, MD   150 mg at 08/26/13 1328   Current Outpatient Prescriptions  Medication Sig Dispense Refill  . medroxyPROGESTERone (DEPO-PROVERA) 150 MG/ML injection Inject 1 mL (150 mg total) into the muscle every 3 (three) months.  1 mL  3    Exam:  BP 113/61  Pulse 73  Temp(Src) 98.6 F (37 C) (Oral)  Resp 12  SpO2 100% Gen: Well NAD Right wrist: 1 cm ganglion cyst present at the dorsal triquetrum area. Nontender. Wrist and hand motion are intact. Capillary refill sensation and pulses are intact.   Ganglion cyst aspiration and injection: Right wrist:  Consent obtained and timeout performed. Specifically discussed the possibility of skin hypopigmentation and fat pad atrophy. Advised patient that success rate for this injection is around 50%. Patient wishes to proceed.  Skin cleaned with Betadine, and cold spray applied. A half milliliter of lidocaine without epinephrine was injected superficial to the ganglion cyst.  18-gauge needle was used to access the cyst. A tiny amount of gelatinous material was aspirated.  A 25-gauge needle was used to access the cyst and 10 mL of triamcinolone and a half a millimeter of  lidocaine were injected into the cyst.  Patient tolerated the procedure well   Assessment and Plan: 23 y.o. female with right ganglion cyst of the right wrist.  Status post aspiration and injection. Plan to use wrist splint rest. Followup with hand surgery for improving.  Discussed warning signs or symptoms. Please see discharge instructions. Patient expresses understanding.    Rodolph BongEvan S Lakita Sahlin, MD 01/30/14 1728

## 2014-02-06 IMAGING — US US PELVIS COMPLETE
1 series · 14 of 25 positions shown · non-contrast
Comparison: None

CLINICAL DATA: Bilateral pelvic pain. On Depo-Provera. Negative
pregnancy test.

EXAM:
TRANSABDOMINAL AND TRANSVAGINAL ULTRASOUND OF PELVIS
TECHNIQUE: Both transabdominal and transvaginal ultrasound examinations of the
pelvis were performed. Transabdominal technique was performed for
global imaging of the pelvis including uterus, ovaries, adnexal
regions, and pelvic cul-de-sac. It was necessary to proceed with
endovaginal exam following the transabdominal exam to visualize the
endometrium and ovaries.

[Series 1: us pelvis complete · 14 of 40 slices shown]
[im 1/40]
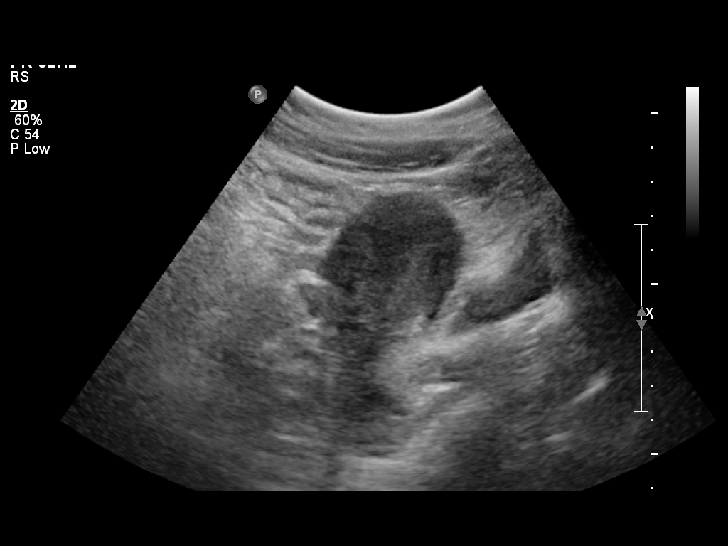
[im 4/40]
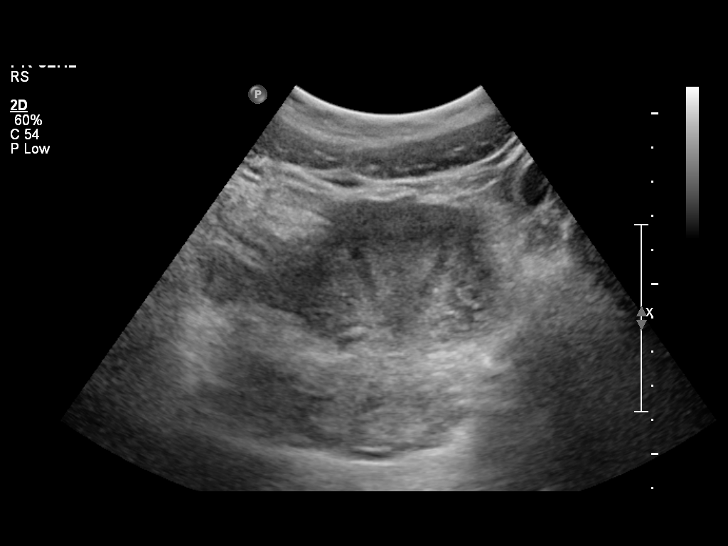
[im 7/40]
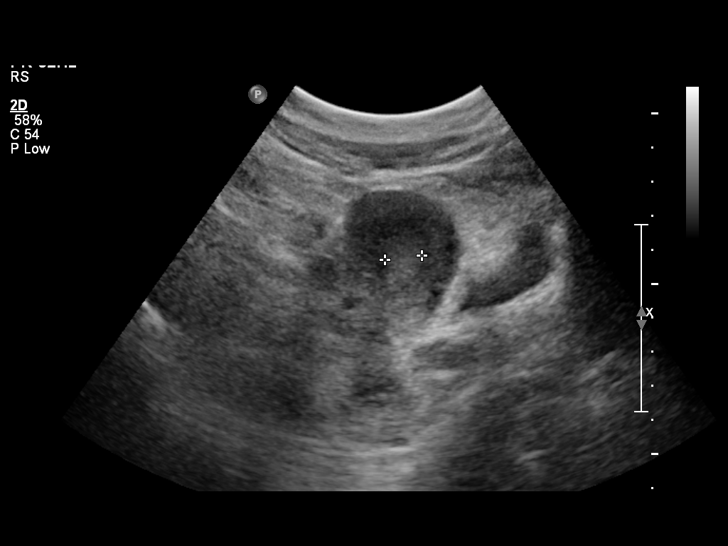
[im 10/40]
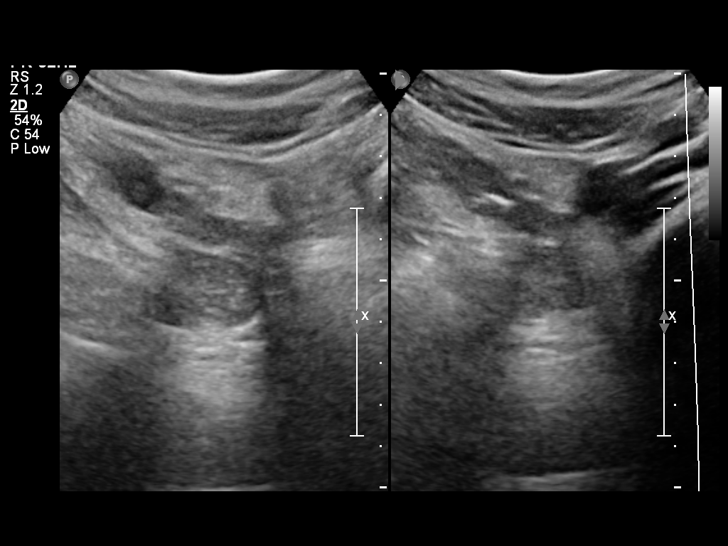
[im 14/40]
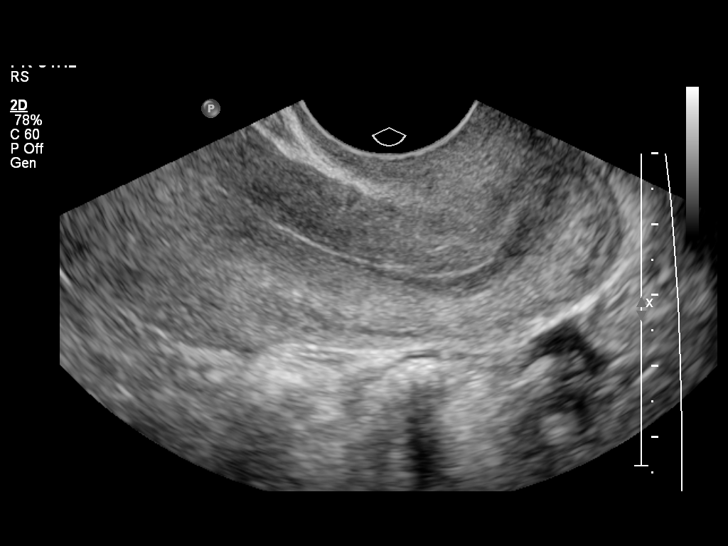
[im 15/40]
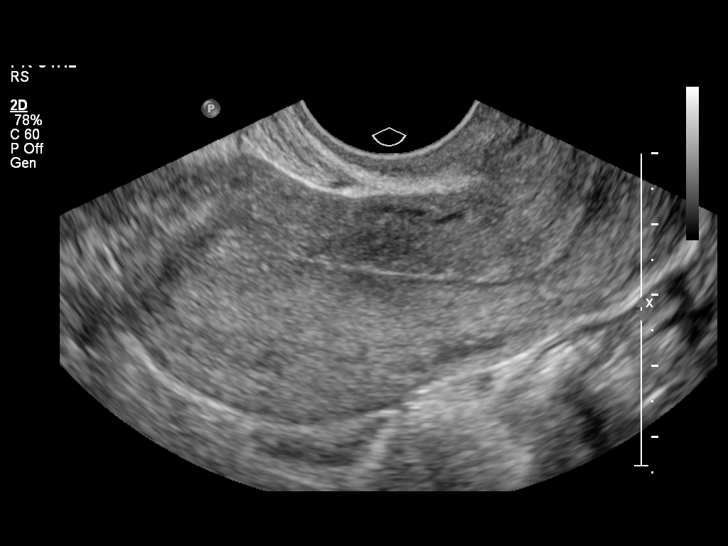
[im 18/40]
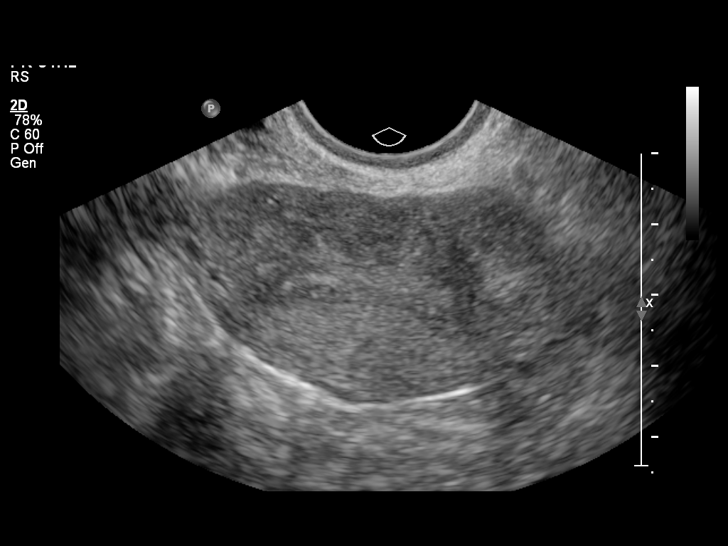
[im 22/40]
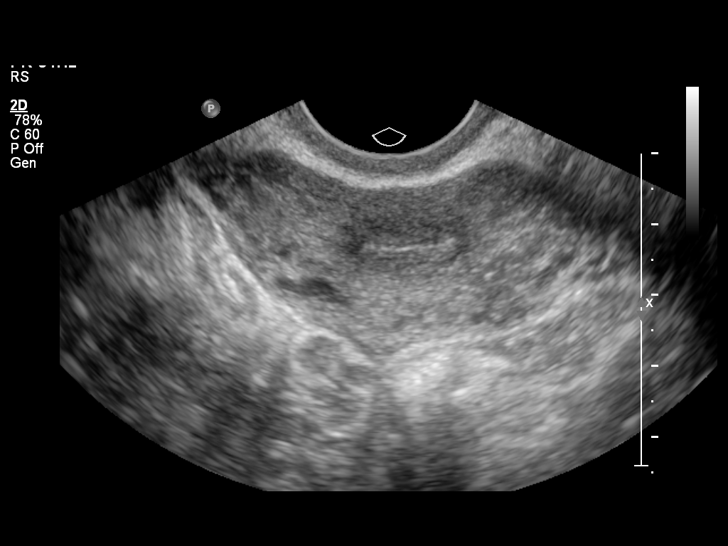
[im 25/40]
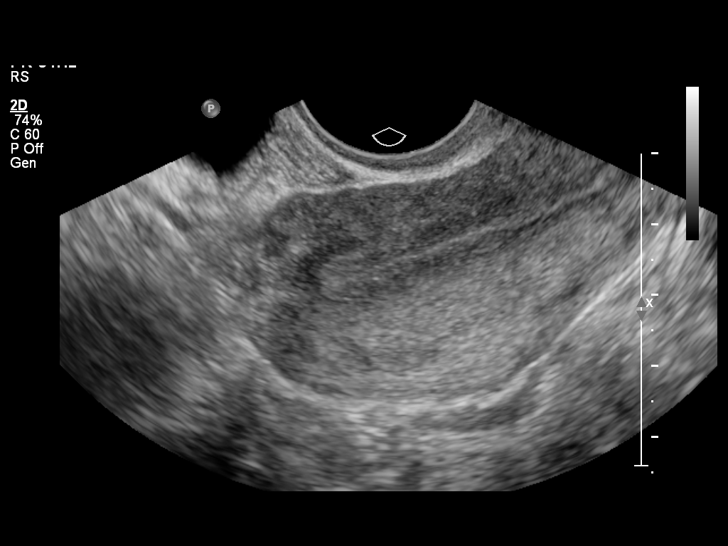
[im 27/40]
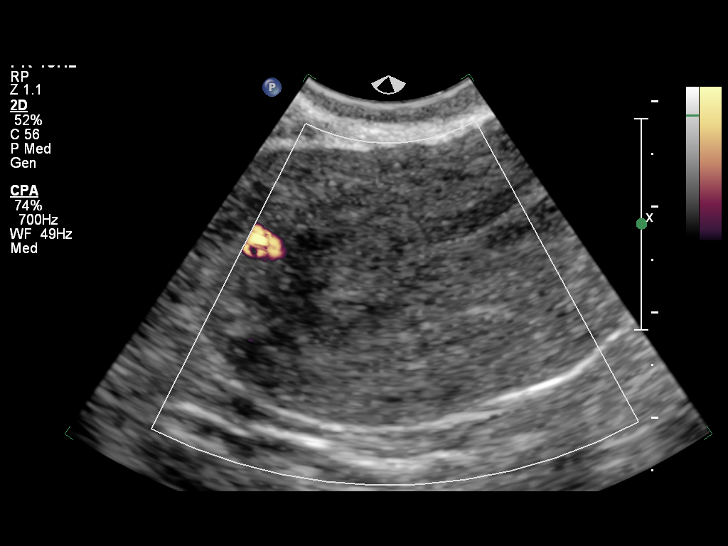
[im 30/40]
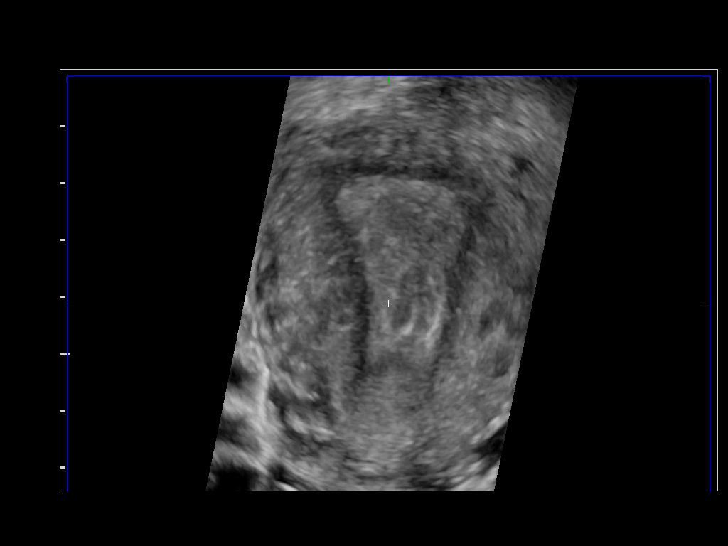
[im 33/40]
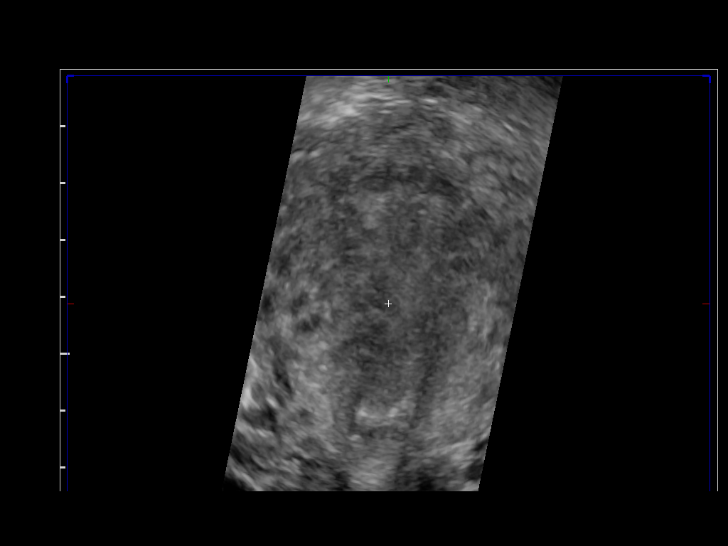
[im 36/40]
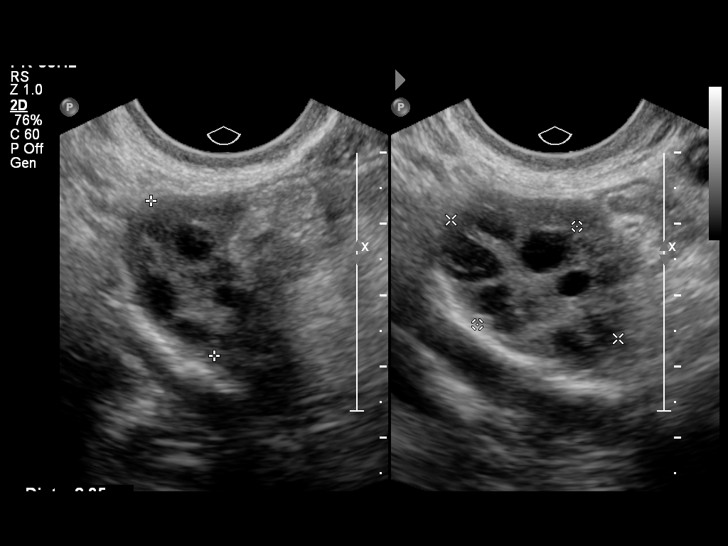
[im 40/40]
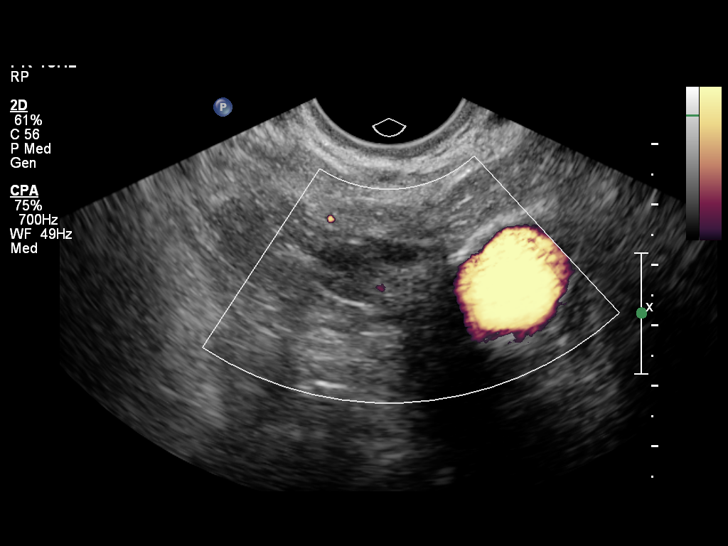

[14 of 25 positions shown; findings below may reference images not displayed]

FINDINGS: Uterus

Measurements: 7.5 x 3.6 x 5.3 cm. No fibroids or other mass
visualized.

Endometrium

Thickness: 7 mm.  No focal abnormality visualized.

Right ovary

Measurements: 2.4 x 2.9 x 1.9 cm. Normal appearance/no adnexal mass.

Left ovary

Measurements: 1.9 x 1.2 x 2.1 cm. Normal appearance/no adnexal mass.

Other findings

No free fluid.
IMPRESSION: Negative. No pelvic mass or other significant abnormality
identified.

## 2014-02-09 ENCOUNTER — Encounter: Payer: Self-pay | Admitting: *Deleted

## 2014-02-09 ENCOUNTER — Ambulatory Visit (INDEPENDENT_AMBULATORY_CARE_PROVIDER_SITE_OTHER): Payer: Medicaid Other | Admitting: *Deleted

## 2014-02-09 VITALS — BP 110/68 | HR 69 | Temp 98.1°F | Wt 176.0 lb

## 2014-02-09 DIAGNOSIS — Z309 Encounter for contraceptive management, unspecified: Secondary | ICD-10-CM

## 2014-02-09 DIAGNOSIS — Z3042 Encounter for surveillance of injectable contraceptive: Secondary | ICD-10-CM

## 2014-02-09 NOTE — Progress Notes (Signed)
Patient is in the office for Depo Provera injection. She reports she is doing well on the shot. BP 110/68  Pulse 69  Temp(Src) 98.1 F (36.7 C)  Wt 176 lb (79.833 kg)     Administrations This Visit   medroxyPROGESTERone (DEPO-PROVERA) injection 150 mg   Administered Action Dose Route Administered By   02/09/2014 Given 150 mg Intramuscular Elson AreasJane F Paul, RN

## 2014-04-29 ENCOUNTER — Ambulatory Visit: Payer: Medicaid Other

## 2014-05-03 ENCOUNTER — Ambulatory Visit: Payer: Medicaid Other

## 2014-05-07 ENCOUNTER — Ambulatory Visit (INDEPENDENT_AMBULATORY_CARE_PROVIDER_SITE_OTHER): Payer: Medicaid Other | Admitting: *Deleted

## 2014-05-07 VITALS — BP 108/66 | HR 64 | Temp 98.2°F | Wt 162.0 lb

## 2014-05-07 DIAGNOSIS — Z3042 Encounter for surveillance of injectable contraceptive: Secondary | ICD-10-CM

## 2014-05-07 DIAGNOSIS — Z309 Encounter for contraceptive management, unspecified: Secondary | ICD-10-CM

## 2014-05-07 NOTE — Progress Notes (Signed)
Pt is in office today for depo injection.  Pt is on time for injection.  Depo given in right deltoid.  Pt tolerated well. Pt advised to RTO on 07/29/14 for next injection.  BP 108/66  Pulse 64  Temp(Src) 98.2 F (36.8 C)  Wt 162 lb (73.483 kg)

## 2014-06-25 ENCOUNTER — Ambulatory Visit: Payer: Medicaid Other | Admitting: Obstetrics

## 2014-07-15 ENCOUNTER — Encounter: Payer: Self-pay | Admitting: Obstetrics

## 2014-07-15 ENCOUNTER — Other Ambulatory Visit: Payer: Self-pay | Admitting: Obstetrics

## 2014-07-15 ENCOUNTER — Ambulatory Visit (INDEPENDENT_AMBULATORY_CARE_PROVIDER_SITE_OTHER): Payer: Medicaid Other | Admitting: Obstetrics

## 2014-07-15 VITALS — BP 126/66 | HR 70 | Temp 97.6°F | Ht 65.0 in | Wt 167.0 lb

## 2014-07-15 DIAGNOSIS — Z113 Encounter for screening for infections with a predominantly sexual mode of transmission: Secondary | ICD-10-CM

## 2014-07-15 DIAGNOSIS — Z0000 Encounter for general adult medical examination without abnormal findings: Secondary | ICD-10-CM
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
## 2014-07-15 NOTE — Addendum Note (Signed)
Addended by: Odessa FlemingBOHNE, Charan Prieto M on: 07/15/2014 03:21 PM   Modules accepted: Orders

## 2014-07-16 LAB — HEPATITIS C ANTIBODY: HCV AB: NEGATIVE

## 2014-07-16 LAB — HEPATITIS B SURFACE ANTIGEN: Hepatitis B Surface Ag: NEGATIVE

## 2014-07-16 LAB — GC/CHLAMYDIA PROBE AMP
CT PROBE, AMP APTIMA: NEGATIVE
GC PROBE AMP APTIMA: NEGATIVE

## 2014-07-16 LAB — HIV ANTIBODY (ROUTINE TESTING W REFLEX): HIV 1&2 Ab, 4th Generation: NONREACTIVE

## 2014-07-16 LAB — RPR

## 2014-07-17 LAB — WET PREP BY MOLECULAR PROBE
Candida species: NEGATIVE
GARDNERELLA VAGINALIS: NEGATIVE
Trichomonas vaginosis: NEGATIVE

## 2014-07-19 LAB — PAP IG W/ RFLX HPV ASCU

## 2014-07-19 LAB — GC/CHLAMYDIA PROBE AMP

## 2014-07-20 LAB — HUMAN PAPILLOMAVIRUS, HIGH RISK: HPV DNA High Risk: DETECTED — AB

## 2014-07-21 ENCOUNTER — Other Ambulatory Visit: Payer: Self-pay | Admitting: Obstetrics

## 2014-07-21 DIAGNOSIS — B3731 Acute candidiasis of vulva and vagina: Secondary | ICD-10-CM

## 2014-07-21 DIAGNOSIS — B373 Candidiasis of vulva and vagina: Secondary | ICD-10-CM

## 2014-07-21 MED ORDER — FLUCONAZOLE 150 MG PO TABS: 150.0000 mg | ORAL_TABLET | Freq: Once | ORAL | Status: DC

## 2014-07-29 ENCOUNTER — Telehealth: Payer: Self-pay | Admitting: *Deleted

## 2014-07-29 NOTE — Telephone Encounter (Signed)
Pt called to office stating that she would like to change her birth control to pill form. Return call to pt.  Pt states that she is thinking of having a baby in near future and would like to be on a pill for better control of conception.  Pt made aware that message would be forwarded to Dr Clearance CootsHarper for review and recommendations.  Please advise on birth control pill.

## 2014-07-30 ENCOUNTER — Other Ambulatory Visit: Payer: Self-pay | Admitting: Obstetrics

## 2014-07-30 DIAGNOSIS — Z30011 Encounter for initial prescription of contraceptive pills: Secondary | ICD-10-CM

## 2014-07-30 MED ORDER — LEVONORGESTREL-ETHINYL ESTRAD 0.15-30 MG-MCG PO TABS: 1.0000 | ORAL_TABLET | Freq: Every day | ORAL | Status: DC

## 2014-07-30 NOTE — Telephone Encounter (Signed)
Patient notified

## 2014-07-30 NOTE — Telephone Encounter (Signed)
[facility].

## 2014-08-03 ENCOUNTER — Encounter (HOSPITAL_COMMUNITY): Payer: Self-pay | Admitting: Emergency Medicine

## 2014-08-03 ENCOUNTER — Emergency Department (HOSPITAL_COMMUNITY)
Admission: EM | Admit: 2014-08-03 | Discharge: 2014-08-03 | Disposition: A | Discharge status: Home / Self Care | Payer: Medicaid Other | Attending: Emergency Medicine | Admitting: Emergency Medicine

## 2014-08-03 DIAGNOSIS — Z8659 Personal history of other mental and behavioral disorders: Secondary | ICD-10-CM | POA: Insufficient documentation

## 2014-08-03 DIAGNOSIS — Z79899 Other long term (current) drug therapy: Secondary | ICD-10-CM | POA: Diagnosis not present

## 2014-08-03 DIAGNOSIS — Z8619 Personal history of other infectious and parasitic diseases: Secondary | ICD-10-CM | POA: Insufficient documentation

## 2014-08-03 DIAGNOSIS — S0081XA Abrasion of other part of head, initial encounter: Secondary | ICD-10-CM | POA: Diagnosis not present

## 2014-08-03 DIAGNOSIS — S0990XA Unspecified injury of head, initial encounter: Secondary | ICD-10-CM | POA: Diagnosis not present

## 2014-08-03 MED ORDER — TETANUS-DIPHTH-ACELL PERTUSSIS 5-2.5-18.5 LF-MCG/0.5 IM SUSP: 0.5000 mL | Freq: Once | INTRAMUSCULAR | Status: DC
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
## 2014-08-03 NOTE — Discharge Instructions (Signed)
Ibuprofen for the headache. Bacitracin ointment twice a day to the abrasion. Keep it clean and dry. Followup with your doctor as needed. Return to emergency department if worsening headache, vomiting, dizziness, confusion.  Facial Laceration  A facial laceration is a cut on the face. These injuries can be painful and cause bleeding. Lacerations usually heal quickly, but they need special care to reduce scarring. DIAGNOSIS  Your health care provider will take a medical history, ask for details about how the injury occurred, and examine the wound to determine how deep the cut is. TREATMENT  Some facial lacerations may not require closure. Others may not be able to be closed because of an increased risk of infection. The risk of infection and the chance for successful closure will depend on various factors, including the amount of time since the injury occurred. The wound may be cleaned to help prevent infection. If closure is appropriate, pain medicines may be given if needed. Your health care provider will use stitches (sutures), wound glue (adhesive), or skin adhesive strips to repair the laceration. These tools bring the skin edges together to allow for faster healing and a better cosmetic outcome. If needed, you may also be given a tetanus shot. HOME CARE INSTRUCTIONS  Only take over-the-counter or prescription medicines as directed by your health care provider.  Follow your health care provider's instructions for wound care. These instructions will vary depending on the technique used for closing the wound. For Sutures:  Keep the wound clean and dry.   If you were given a bandage (dressing), you should change it at least once a day. Also change the dressing if it becomes wet or dirty, or as directed by your health care provider.   Wash the wound with soap and water 2 times a day. Rinse the wound off with water to remove all soap. Pat the wound dry with a clean towel.   After cleaning,  apply a thin layer of the antibiotic ointment recommended by your health care provider. This will help prevent infection and keep the dressing from sticking.   You may shower as usual after the first 24 hours. Do not soak the wound in water until the sutures are removed.   Get your sutures removed as directed by your health care provider. With facial lacerations, sutures should usually be taken out after 4-5 days to avoid stitch marks.   Wait a few days after your sutures are removed before applying any makeup. For Skin Adhesive Strips:  Keep the wound clean and dry.   Do not get the skin adhesive strips wet. You may bathe carefully, using caution to keep the wound dry.   If the wound gets wet, pat it dry with a clean towel.   Skin adhesive strips will fall off on their own. You may trim the strips as the wound heals. Do not remove skin adhesive strips that are still stuck to the wound. They will fall off in time.  For Wound Adhesive:  You may briefly wet your wound in the shower or bath. Do not soak or scrub the wound. Do not swim. Avoid periods of heavy sweating until the skin adhesive has fallen off on its own. After showering or bathing, gently pat the wound dry with a clean towel.   Do not apply liquid medicine, cream medicine, ointment medicine, or makeup to your wound while the skin adhesive is in place. This may loosen the film before your wound is healed.   If a  dressing is placed over the wound, be careful not to apply tape directly over the skin adhesive. This may cause the adhesive to be pulled off before the wound is healed.   Avoid prolonged exposure to sunlight or tanning lamps while the skin adhesive is in place.  The skin adhesive will usually remain in place for 5-10 days, then naturally fall off the skin. Do not pick at the adhesive film.  After Healing: Once the wound has healed, cover the wound with sunscreen during the day for 1 full year. This can help  minimize scarring. Exposure to ultraviolet light in the first year will darken the scar. It can take 1-2 years for the scar to lose its redness and to heal completely.  SEEK IMMEDIATE MEDICAL CARE IF:  You have redness, pain, or swelling around the wound.   You see ayellowish-white fluid (pus) coming from the wound.   You have chills or a fever.  MAKE SURE YOU:  Understand these instructions.  Will watch your condition.  Will get help right away if you are not doing well or get worse. Document Released: 11/08/2004 Document Revised: 07/22/2013 Document Reviewed: 05/14/2013 [facility]Exit[facility] Patient Information 2015 [employer], [place]. This information is not intended to replace advice given to you by your health care provider. Make sure you discuss any questions you have with your health care provider.   Assault, General Assault includes any behavior, whether intentional or reckless, which results in bodily injury to another person and/or damage to property. Included in this would be any behavior, intentional or reckless, that by its nature would be understood (interpreted) by a reasonable person as intent to harm another person or to damage his/her property. Threats may be oral or written. They may be communicated through regular mail, computer, fax, or phone. These threats may be direct or implied. FORMS OF ASSAULT INCLUDE:  Physically assaulting a person. This includes physical threats to inflict physical harm as well as:  Slapping.  Hitting.  Poking.  Kicking.  Punching.  Pushing.  Arson.  Sabotage.  Equipment vandalism.  Damaging or destroying property.  Throwing or hitting objects.  Displaying a weapon or an object that appears to be a weapon in a threatening manner.  Carrying a firearm of any kind.  Using a weapon to harm someone.  Using greater physical size/strength to intimidate another.  Making intimidating or threatening  gestures.  Bullying.  Hazing.  Intimidating, threatening, hostile, or abusive language directed toward another person.  It communicates the intention to engage in violence against that person. And it leads a reasonable person to expect that violent behavior may occur.  Stalking another person. IF IT HAPPENS AGAIN:  Immediately call for emergency help (911 in [place]  If someone poses clear and immediate danger to you, seek legal authorities to have a protective or restraining order put in place.  Less threatening assaults can at least be reported to authorities. STEPS TO TAKE IF A SEXUAL ASSAULT HAS HAPPENED  Go to an area of safety. This may include a shelter or staying with a friend. Stay away from the area where you have been attacked. A large percentage of sexual assaults are caused by a friend, relative or associate.  If medications were given by your caregiver, take them as directed for the full length of time prescribed.  Only take over-the-counter or prescription medicines for pain, discomfort, or fever as directed by your caregiver.  If you have come in contact with a sexual disease, find out  if you are to be tested again. If your caregiver is concerned about the HIV/AIDS virus, he/she may require you to have continued testing for several months.  For the protection of your privacy, test results can not be given over the phone. Make sure you receive the results of your test. If your test results are not back during your visit, make an appointment with your caregiver to find out the results. Do not assume everything is normal if you have not heard from your caregiver or the medical facility. It is important for you to follow up on all of your test results.  File appropriate papers with authorities. This is important in all assaults, even if it has occurred in a family or by a friend. SEEK MEDICAL CARE IF:  You have new problems because of your injuries.  You have problems  that may be because of the medicine you are taking, such as:  Rash.  Itching.  Swelling.  Trouble breathing.  You develop belly (abdominal) pain, feel sick to your stomach (nausea) or are vomiting.  You begin to run a temperature.  You need supportive care or referral to a rape crisis center. These are centers with trained personnel who can help you get through this ordeal. SEEK IMMEDIATE MEDICAL CARE IF:  You are afraid of being threatened, beaten, or abused. In [place] call 911.  You receive new injuries related to abuse.  You develop severe pain in any area injured in the assault or have any change in your condition that concerns you.  You faint or lose consciousness.  You develop chest pain or shortness of breath. Document Released: 10/01/2005 Document Revised: 12/24/2011 Document Reviewed: 05/19/2008 [facility]Exit[facility] Patient Information 2015 [place], [place]. This information is not intended to replace advice given to you by your health care provider. Make sure you discuss any questions you have with your health care provider.

## 2014-08-03 NOTE — ED Provider Notes (Signed)
Medical screening examination/treatment/procedure(s) were performed by non-physician practitioner and as supervising physician I was immediately available for consultation/collaboration.   EKG Interpretation None        Shon Batonourtney F Arrick Dutton, MD 08/03/14 1859

## 2014-08-16 ENCOUNTER — Encounter (HOSPITAL_COMMUNITY): Payer: Self-pay | Admitting: Emergency Medicine

## 2014-09-10 ENCOUNTER — Encounter (HOSPITAL_COMMUNITY): Payer: Self-pay

## 2014-09-10 ENCOUNTER — Emergency Department (HOSPITAL_COMMUNITY)
Admission: EM | Admit: 2014-09-10 | Discharge: 2014-09-10 | Disposition: A | Discharge status: Home / Self Care | Payer: Medicaid Other | Attending: Emergency Medicine | Admitting: Emergency Medicine

## 2014-09-10 DIAGNOSIS — Z87891 Personal history of nicotine dependence: Secondary | ICD-10-CM | POA: Diagnosis not present

## 2014-09-10 DIAGNOSIS — Z8619 Personal history of other infectious and parasitic diseases: Secondary | ICD-10-CM | POA: Diagnosis not present

## 2014-09-10 DIAGNOSIS — R221 Localized swelling, mass and lump, neck: Secondary | ICD-10-CM | POA: Diagnosis present

## 2014-09-10 DIAGNOSIS — Z793 Long term (current) use of hormonal contraceptives: Secondary | ICD-10-CM | POA: Insufficient documentation

## 2014-09-10 DIAGNOSIS — R590 Localized enlarged lymph nodes: Secondary | ICD-10-CM | POA: Insufficient documentation

## 2014-09-10 DIAGNOSIS — R599 Enlarged lymph nodes, unspecified: Secondary | ICD-10-CM

## 2014-09-10 DIAGNOSIS — Z8659 Personal history of other mental and behavioral disorders: Secondary | ICD-10-CM | POA: Diagnosis not present

## 2014-09-10 NOTE — Discharge Instructions (Signed)
Please follow with your primary care doctor in the next 2 days for a check-up. They must obtain records for further management.   Do not hesitate to return to the Emergency Department for any new, worsening or concerning symptoms.    Swollen Lymph Nodes The lymphatic system filters fluid from around cells. It is like a system of blood vessels. These channels carry lymph instead of blood. The lymphatic system is an important part of the immune (disease fighting) system. When people talk about "swollen glands in the neck," they are usually talking about swollen lymph nodes. The lymph nodes are like the little traps for infection. You and your caregiver may be able to feel lymph nodes, especially swollen nodes, in these common areas: the groin (inguinal area), armpits (axilla), and above the clavicle (supraclavicular). You may also feel them in the neck (cervical) and the back of the head just above the hairline (occipital). Swollen glands occur when there is any condition in which the body responds with an allergic type of reaction. For instance, the glands in the neck can become swollen from insect bites or any type of minor infection on the head. These are very noticeable in children with only minor problems. Lymph nodes may also become swollen when there is a tumor or problem with the lymphatic system, such as Hodgkin's disease. TREATMENT   Most swollen glands do not require treatment. They can be observed (watched) for a short period of time, if your caregiver feels it is necessary. Most of the time, observation is not necessary.  Antibiotics (medicines that kill germs) may be prescribed by your caregiver. Your caregiver may prescribe these if he or she feels the swollen glands are due to a bacterial (germ) infection. Antibiotics are not used if the swollen glands are caused by a virus. HOME CARE INSTRUCTIONS   Take medications as directed by your caregiver. Only take over-the-counter or  prescription medicines for pain, discomfort, or fever as directed by your caregiver. SEEK MEDICAL CARE IF:   If you begin to run a temperature greater than 102 F (38.9 C), or as your caregiver suggests. MAKE SURE YOU:   Understand these instructions.  Will watch your condition.  Will get help right away if you are not doing well or get worse. Document Released: 09/21/2002 Document Revised: 12/24/2011 Document Reviewed: 10/01/2005 [facility]Exit[facility] Patient Information 2015 [place], [place]. This information is not intended to replace advice given to you by your health care provider. Make sure you discuss any questions you have with your health care provider.

## 2014-09-10 NOTE — ED Notes (Signed)
Pt states "bump on neck". Noticed 2 days ago.  Knot is soft and located on left side.  Dime size in diameter.

## 2014-09-10 NOTE — ED Provider Notes (Signed)
CSN: [id number]     Arrival date & time 09/10/14  1105 History   First MD Initiated Contact with Patient 09/10/14 1126     Chief Complaint  Patient presents with  . Cyst     (Consider location/radiation/quality/duration/timing/severity/associated sxs/prior Treatment) HPI  Beverly Marquez is a 23 y.o. female complaining of lump on neck switching noticed 2 days ago. Patient denies pain, lymphadenopathy in other regions, rhinorrhea, trauma, easy bruising or bleeding, unintentional weight loss, fever, chills.  Past Medical History  Diagnosis Date  . Trichomonas   . No pertinent past medical history   . Bipolar 1 disorder    Past Surgical History  Procedure Laterality Date  . No past surgeries     Family History  Problem Relation Age of Onset  . Other Neg Hx    History  Substance Use Topics  . Smoking status: Former Smoker    Types: Cigarettes  . Smokeless tobacco: Never Used  . Alcohol Use: No   OB History    Gravida Para Term Preterm AB TAB SAB Ectopic Multiple Living   2 2 2       2      Review of Systems  10 systems reviewed and found to be negative, except as noted in the HPI.   Allergies  Review of patient's allergies indicates no known allergies.  Home Medications   Prior to Admission medications   Medication Sig Start Date End Date Taking? Authorizing Provider  levonorgestrel-ethinyl estradiol (NORDETTE) 0.15-30 MG-MCG tablet Take 1 tablet by mouth daily. 07/30/14  Yes Brock Badharles A Harper, MD  fluconazole (DIFLUCAN) 150 MG tablet Take 1 tablet (150 mg total) by mouth once. Patient not taking: Reported on 09/10/2014 07/21/14   Brock Badharles A Harper, MD  medroxyPROGESTERone (DEPO-PROVERA) 150 MG/ML injection Inject 1 mL (150 mg total) into the muscle every 3 (three) months. Patient not taking: Reported on 09/10/2014 11/12/13   Antionette CharLisa [name], MD   BP 113/59 mmHg  Pulse 80  Temp(Src) 98.4 F (36.9 C) (Oral)  Resp 16  SpO2 99%  LMP 08/23/2014 Physical  Exam  Constitutional: She is oriented to person, place, and time. She appears well-developed and well-nourished. No distress.  HENT:  Head: Normocephalic.  Eyes: Conjunctivae and EOM are normal.  Cardiovascular: Normal rate.   Pulmonary/Chest: Effort normal. No stridor.  Musculoskeletal: Normal range of motion.  Lymphadenopathy:       Head (right side): No submental, no submandibular, no tonsillar, no preauricular, no posterior auricular and no occipital adenopathy present.       Head (left side): No submental, no submandibular, no tonsillar, no preauricular, no posterior auricular and no occipital adenopathy present.    She has cervical adenopathy.       Left cervical: Superficial cervical adenopathy present. No posterior cervical adenopathy present.    She has no axillary adenopathy.       Right: No supraclavicular adenopathy present.       Left: No supraclavicular adenopathy present.  Small, mobile, mildly tender to palpation anterior cervical lymphadenopathy. No other lymph nodes appreciated.  Neurological: She is alert and oriented to person, place, and time.  Psychiatric: She has a normal mood and affect.  Nursing note and vitals reviewed.   ED Course  Procedures (including critical care time) Labs Review Labs Reviewed - No data to display  Imaging Review No results found.   EKG Interpretation None      MDM   Final diagnoses:  Lymph node enlargement    Filed  Vitals:   09/10/14 1118  BP: 113/59  Pulse: 80  Temp: 98.4 F (36.9 C)  TempSrc: Oral  Resp: 16  SpO2: 99%    Beverly Marquez is a 23 y.o. female presenting for evaluation of mobile left anterior cervical lymph node. No others lymph nodes appreciated, no other red flags for malignancy.  Evaluation does not show pathology that would require ongoing emergent intervention or inpatient treatment. Pt is hemodynamically stable and mentating appropriately. Discussed findings and plan with patient/guardian,  who agrees with care plan. All questions answered. Return precautions discussed and outpatient follow up given.        Wynetta Emeryicole Lamekia Nolden, PA-C 09/10/14 1945  Tilden FossaElizabeth Rees, MD 09/11/14 [phone]

## 2014-10-19 ENCOUNTER — Encounter (HOSPITAL_COMMUNITY): Payer: Self-pay | Admitting: *Deleted

## 2014-10-19 ENCOUNTER — Inpatient Hospital Stay (HOSPITAL_COMMUNITY)
Admission: AD | Admit: 2014-10-19 | Discharge: 2014-10-19 | Disposition: A | Discharge status: Home / Self Care | Payer: Medicaid Other | Source: Ambulatory Visit | Attending: Obstetrics | Admitting: Obstetrics

## 2014-10-19 DIAGNOSIS — F17210 Nicotine dependence, cigarettes, uncomplicated: Secondary | ICD-10-CM | POA: Insufficient documentation

## 2014-10-19 DIAGNOSIS — N920 Excessive and frequent menstruation with regular cycle: Secondary | ICD-10-CM | POA: Insufficient documentation

## 2014-10-19 DIAGNOSIS — Z30011 Encounter for initial prescription of contraceptive pills: Secondary | ICD-10-CM | POA: Diagnosis not present

## 2014-10-19 LAB — CBC
HCT: 37.6 % (ref 36.0–46.0)
Hemoglobin: 12.6 g/dL (ref 12.0–15.0)
MCH: 33.3 pg (ref 26.0–34.0)
MCHC: 33.5 g/dL (ref 30.0–36.0)
MCV: 99.5 fL (ref 78.0–100.0)
PLATELETS: 304 10*3/uL (ref 150–400)
RBC: 3.78 MIL/uL — AB (ref 3.87–5.11)
RDW: 13.3 % (ref 11.5–15.5)
WBC: 6.2 10*3/uL (ref 4.0–10.5)

## 2014-10-19 LAB — POCT PREGNANCY, URINE: PREG TEST UR: NEGATIVE

## 2014-10-19 MED ORDER — LEVONORGESTREL-ETHINYL ESTRAD 0.15-30 MG-MCG PO TABS: 1.0000 | ORAL_TABLET | ORAL | Status: DC

## 2014-10-19 NOTE — Discharge Instructions (Signed)
You must take your pills consistently every day to have normal menstrual cycles and to prevent pregnancy.   Contraception Choices Contraception (birth control) is the use of any methods or devices to prevent pregnancy. Below are some methods to help avoid pregnancy. HORMONAL METHODS   Contraceptive implant. This is a thin, plastic tube containing progesterone hormone. It does not contain estrogen hormone. Your health care provider inserts the tube in the inner part of the upper arm. The tube can remain in place for up to 3 years. After 3 years, the implant must be removed. The implant prevents the ovaries from releasing an egg (ovulation), thickens the cervical mucus to prevent sperm from entering the uterus, and thins the lining of the inside of the uterus.  Progesterone-only injections. These injections are given every 3 months by your health care provider to prevent pregnancy. This synthetic progesterone hormone stops the ovaries from releasing eggs. It also thickens cervical mucus and changes the uterine lining. This makes it harder for sperm to survive in the uterus.  Birth control pills. These pills contain estrogen and progesterone hormone. They work by preventing the ovaries from releasing eggs (ovulation). They also cause the cervical mucus to thicken, preventing the sperm from entering the uterus. Birth control pills are prescribed by a health care provider.Birth control pills can also be used to treat heavy periods.  Minipill. This type of birth control pill contains only the progesterone hormone. They are taken every day of each month and must be prescribed by your health care provider.  Birth control patch. The patch contains hormones similar to those in birth control pills. It must be changed once a week and is prescribed by a health care provider.  Vaginal ring. The ring contains hormones similar to those in birth control pills. It is left in the vagina for 3 weeks, removed for 1  week, and then a new one is put back in place. The patient must be comfortable inserting and removing the ring from the vagina.A health care provider's prescription is necessary.  Emergency contraception. Emergency contraceptives prevent pregnancy after unprotected sexual intercourse. This pill can be taken right after sex or up to 5 days after unprotected sex. It is most effective the sooner you take the pills after having sexual intercourse. Most emergency contraceptive pills are available without a prescription. Check with your pharmacist. Do not use emergency contraception as your only form of birth control. BARRIER METHODS   Female condom. This is a thin sheath (latex or rubber) that is worn over the penis during sexual intercourse. It can be used with spermicide to increase effectiveness.  Female condom. This is a soft, loose-fitting sheath that is put into the vagina before sexual intercourse.  Diaphragm. This is a soft, latex, dome-shaped barrier that must be fitted by a health care provider. It is inserted into the vagina, along with a spermicidal jelly. It is inserted before intercourse. The diaphragm should be left in the vagina for 6 to 8 hours after intercourse.  Cervical cap. This is a round, soft, latex or plastic cup that fits over the cervix and must be fitted by a health care provider. The cap can be left in place for up to 48 hours after intercourse.  Sponge. This is a soft, circular piece of polyurethane foam. The sponge has spermicide in it. It is inserted into the vagina after wetting it and before sexual intercourse.  Spermicides. These are chemicals that kill or block sperm from entering the cervix  and uterus. They come in the form of creams, jellies, suppositories, foam, or tablets. They do not require a prescription. They are inserted into the vagina with an applicator before having sexual intercourse. The process must be repeated every time you have sexual  intercourse. INTRAUTERINE CONTRACEPTION  Intrauterine device (IUD). This is a T-shaped device that is put in a woman's uterus during a menstrual period to prevent pregnancy. There are 2 types:  Copper IUD. This type of IUD is wrapped in copper wire and is placed inside the uterus. Copper makes the uterus and fallopian tubes produce a fluid that kills sperm. It can stay in place for 10 years.  Hormone IUD. This type of IUD contains the hormone progestin (synthetic progesterone). The hormone thickens the cervical mucus and prevents sperm from entering the uterus, and it also thins the uterine lining to prevent implantation of a fertilized egg. The hormone can weaken or kill the sperm that get into the uterus. It can stay in place for 3-5 years, depending on which type of IUD is used. PERMANENT METHODS OF CONTRACEPTION  Female tubal ligation. This is when the woman's fallopian tubes are surgically sealed, tied, or blocked to prevent the egg from traveling to the uterus.  Hysteroscopic sterilization. This involves placing a small coil or insert into each fallopian tube. Your doctor uses a technique called hysteroscopy to do the procedure. The device causes scar tissue to form. This results in permanent blockage of the fallopian tubes, so the sperm cannot fertilize the egg. It takes about 3 months after the procedure for the tubes to become blocked. You must use another form of birth control for these 3 months.  Female sterilization. This is when the female has the tubes that carry sperm tied off (vasectomy).This blocks sperm from entering the vagina during sexual intercourse. After the procedure, the man can still ejaculate fluid (semen). NATURAL PLANNING METHODS  Natural family planning. This is not having sexual intercourse or using a barrier method (condom, diaphragm, cervical cap) on days the woman could become pregnant.  Calendar method. This is keeping track of the length of each menstrual cycle  and identifying when you are fertile.  Ovulation method. This is avoiding sexual intercourse during ovulation.  Symptothermal method. This is avoiding sexual intercourse during ovulation, using a thermometer and ovulation symptoms.  Post-ovulation method. This is timing sexual intercourse after you have ovulated. Regardless of which type or method of contraception you choose, it is important that you use condoms to protect against the transmission of sexually transmitted infections (STIs). Talk with your health care provider about which form of contraception is most appropriate for you. Document Released: 10/01/2005 Document Revised: 10/06/2013 Document Reviewed: 03/26/2013 [facility] Patient Information 2015 [place], [place]. This information is not intended to replace advice given to you by your health care provider. Make sure you discuss any questions you have with your health care provider.  Ethinyl Estradiol; Norelgestromin skin patches What is this medicine? ETHINYL ESTRADIOL;NORELGESTROMIN (ETH in il es tra DYE ole; nor el JES troe min) skin patch is used as a contraceptive (birth control method). This medicine combines two types of female hormones, an estrogen and a progestin. This patch is used to prevent ovulation and pregnancy. This medicine may be used for other purposes; ask your health care provider or pharmacist if you have questions. COMMON BRAND NAME(S): Ortho Christianne Borrow What should I tell my health care provider before I take this medicine? They need to know if you have or  ever had any of these conditions: -abnormal vaginal bleeding -blood vessel disease or blood clots -breast, cervical, endometrial, ovarian, liver, or uterine cancer -diabetes -gallbladder disease -heart disease or recent heart attack -high blood pressure -high cholesterol -kidney disease -liver disease -migraine headaches -stroke -systemic lupus erythematosus (SLE) -tobacco smoker -an unusual or  allergic reaction to estrogens, progestins, other medicines, foods, dyes, or preservatives -pregnant or trying to get pregnant -breast-feeding How should I use this medicine? This patch is applied to the skin. Follow the directions on the prescription label. Apply to clean, dry, healthy skin on the buttock, abdomen, upper outer arm or upper torso, in a place where it will not be rubbed by tight clothing. Do not use lotions or other cosmetics on the site where the patch will go. Press the patch firmly in place for 10 seconds to ensure good contact with the skin. Change the patch every 7 days on the same day of the week for 3 weeks. You will then have a break from the patch for 1 week, after which you will apply a new patch. Do not use your medicine more often than directed. Contact your pediatrician regarding the use of this medicine in children. Special care may be needed. This medicine has been used in female children who have started having menstrual periods. A patient package insert for the product will be given with each prescription and refill. Read this sheet carefully each time. The sheet may change frequently. Overdosage: If you think you have taken too much of this medicine contact a poison control center or emergency room at once. NOTE: This medicine is only for you. Do not share this medicine with others. What if I miss a dose? You will need to replace your patch once a week as directed. If your patch is lost or falls off, contact your health care professional for advice. You may need to use another form of birth control if your patch has been off for more than 1 day. What may interact with this medicine? -acetaminophen -antibiotics or medicines for infections, especially rifampin, rifabutin, rifapentine, and griseofulvin, and possibly penicillins or tetracyclines -aprepitant -ascorbic acid (vitamin C) -atorvastatin -barbiturate medicines, such as  phenobarbital -bosentan -carbamazepine -caffeine -clofibrate -cyclosporine -dantrolene -doxercalciferol -felbamate -grapefruit juice -hydrocortisone -medicines for anxiety or sleeping problems, such as diazepam or temazepam -medicines for diabetes, including pioglitazone -modafinil -mycophenolate -nefazodone -oxcarbazepine -phenytoin -prednisolone -ritonavir or other medicines for HIV infection or AIDS -rosuvastatin -selegiline -soy isoflavones supplements -St. John's wort -tamoxifen or raloxifene -theophylline -thyroid hormones -topiramate -warfarin This list may not describe all possible interactions. Give your health care provider a list of all the medicines, herbs, non-prescription drugs, or dietary supplements you use. Also tell them if you smoke, drink alcohol, or use illegal drugs. Some items may interact with your medicine. What should I watch for while using this medicine? Visit your doctor or health care professional for regular checks on your progress. You will need a regular breast and pelvic exam and Pap smear while on this medicine. Use an additional method of contraception during the first cycle that you use this patch. If you have any reason to think you are pregnant, stop using this medicine right away and contact your doctor or health care professional. If you are using this medicine for hormone related problems, it may take several cycles of use to see improvement in your condition. Smoking increases the risk of getting a blood clot or having a stroke while you are using hormonal birth  control, especially if you are more than 24 years old. You are strongly advised not to smoke. This medicine can make your body retain fluid, making your fingers, hands, or ankles swell. Your blood pressure can go up. Contact your doctor or health care professional if you feel you are retaining fluid. This medicine can make you more sensitive to the sun. Keep out of the sun. If  you cannot avoid being in the sun, wear protective clothing and use sunscreen. Do not use sun lamps or tanning beds/booths. If you wear contact lenses and notice visual changes, or if the lenses begin to feel uncomfortable, consult your eye care specialist. In some women, tenderness, swelling, or minor bleeding of the gums may occur. Notify your dentist if this happens. Brushing and flossing your teeth regularly may help limit this. See your dentist regularly and inform your dentist of the medicines you are taking. If you are going to have elective surgery or a MRI, you may need to stop using this medicine before the surgery or MRI. Consult your health care professional for advice. This medicine does not protect you against HIV infection (AIDS) or any other sexually transmitted diseases. What side effects may I notice from receiving this medicine? Side effects that you should report to your doctor or health care professional as soon as possible: -breast tissue changes or discharge -changes in vaginal bleeding during your period or between your periods -chest pain -coughing up blood -dizziness or fainting spells -headaches or migraines -leg, arm or groin pain -severe or sudden headaches -stomach pain (severe) -sudden shortness of breath -sudden loss of coordination, especially on one side of the body -speech problems -symptoms of vaginal infection like itching, irritation or unusual discharge -tenderness in the upper abdomen -vomiting -weakness or numbness in the arms or legs, especially on one side of the body -yellowing of the eyes or skin Side effects that usually do not require medical attention (report to your doctor or health care professional if they continue or are bothersome): -breakthrough bleeding and spotting that continues beyond the 3 initial cycles of pills -breast tenderness -mood changes, anxiety, depression, frustration, anger, or emotional outbursts -increased  sensitivity to sun or ultraviolet light -nausea -skin rash, acne, or brown spots on the skin -weight gain (slight) This list may not describe all possible side effects. Call your doctor for medical advice about side effects. You may report side effects to FDA at 1-800-FDA-1088. Where should I keep my medicine? Keep out of the reach of children. Store at room temperature between 15 and 30 degrees C (59 and 86 degrees F). Keep the patch in its pouch until time of use. Throw away any unused medicine after the expiration date. Dispose of used patches properly. Since a used patch may still contain active hormones, fold the patch in half so that it sticks to itself prior to disposal. Throw away in a place where children or pets cannot reach. NOTE: This sheet is a summary. It may not cover all possible information. If you have questions about this medicine, talk to your doctor, pharmacist, or health care provider.  2015, [employer]. (2008-09-16 01:02:7212:06:24)

## 2014-10-19 NOTE — MAU Note (Addendum)
Been on period longer than normal.  Started on 12/25. Not heavier than usual. No dizziness, feels fine

## 2014-10-19 NOTE — MAU Provider Note (Signed)
Chief Complaint: Vaginal Bleeding   First Provider Initiated Contact with Patient 10/19/14 [phone]     SUBJECTIVE HPI: Beverly Marquez is a 24 y.o. [id number] presents with menorrhagia. LMP began 10/08/2014 and continues to be heavy on 10/19/2014 with bright red blood. Normal menstrual period is generally 3 days of heavy menstruation followed by 2 days of lighter menstruation. Started OCP 3 months ago. Only takes pills approximately 2 days before sexual activity.   Past Medical History  Diagnosis Date  . Trichomonas   . No pertinent past medical history   . Bipolar 1 disorder    OB History  Gravida Para Term Preterm AB SAB TAB Ectopic Multiple Living  # Outcome Date GA Lbr Len/2nd Weight Sex Delivery Anes PTL Lv  2 Term 11/15/11 [redacted]w[redacted]d 10:45 / 00:11 3.118 kg (6 lb 14 oz) F Vag-Spont EPI    1 Term 06/30/10    F Vag-Spont EPI N      Past Surgical History  Procedure Laterality Date  . No past surgeries     History   Social History  . Marital Status: Single    Spouse Name: N/A    Number of Children: 2  . Years of Education: N/A   Occupational History  . Not on file.   Social History Main Topics  . Smoking status: Occasional nicotine use when stressed, smokes 1-2x month    Types: Cigarettes  . Smokeless tobacco: Never Used  . Alcohol Use: Occasional alcohol use - every few months  . Drug Use: No  . Sexual Activity:    Partners: Male    Birth Control/ Protection: Pill - sporadic use 2 days prior to sexual activity   Other Topics Concern  . Not on file   Social History Narrative   No current facility-administered medications on file prior to encounter.   Current Outpatient Prescriptions on File Prior to Encounter  Medication Sig Dispense Refill  . fluconazole (DIFLUCAN) 150 MG tablet Take 1 tablet (150 mg total) by mouth once. (Patient not taking: Reported on 09/10/2014) 1 tablet 2  . medroxyPROGESTERone (DEPO-PROVERA) 150 MG/ML injection Inject 1 mL (150  mg total) into the muscle every 3 (three) months. (Patient not taking: Reported on 09/10/2014) 1 mL 3   No Known Allergies  ROS: Denies fever, N/V, dysmenorrhea, fatigue. Has reduced appetite due to stress.   OBJECTIVE Blood pressure 136/68, pulse 78, temperature 99.3 F (37.4 C), temperature source Oral, resp. rate 18, height  (1.651 m), weight 70.308 kg (155 lb), last menstrual period 10/08/2014. GENERAL: Well-developed, well-nourished female in no acute distress.  HEENT: Normocephalic HEART: normal rate RESP: normal effort ABDOMEN: Soft, non-tender EXTREMITIES: Nontender, no edema NEURO: Alert and oriented SPECULUM EXAM: NEFG, small-mod amount of dark red blood noted, cervix clean BIMANUAL: cervix closed; uterus normal size, no adnexal tenderness or masses  LAB RESULTS Results for orders placed or performed during the hospital encounter of 10/19/14 (from the past 24 hour(s))  Pregnancy, urine POC     Status: None   Collection Time: 10/19/14 10:04 AM  Result Value Ref Range   Preg Test, Ur NEGATIVE NEGATIVE  CBC     Status: Abnormal   Collection Time: 10/19/14 10:19 AM  Result Value Ref Range   WBC 6.2 4.0 - 10.5 K/uL   RBC 3.78 (L) 3.87 - 5.11 MIL/uL   Hemoglobin 12.6 12.0 - 15.0 g/dL   HCT 24.4 01.0 -  46.0 %   MCV 99.5 78.0 - 100.0 fL   MCH 33.3 26.0 - 34.0 pg   MCHC 33.5 30.0 - 36.0 g/dL   RDW 40.913.3 81.111.5 - 91.415.5 %   Platelets 304 150 - 400 K/uL    IMAGING No results found.  MAU COURSE  ASSESSMENT 1. Menorrhagia with regular cycle   2. Encounter for initial prescription of contraceptive pills    PLAN Discharge home in stable condition. Will do OCP taper to get bleeding to stop then strongly encouraged to take pills every day for maximum efficacy and consistent menstrual cycles.  Follow-up Information    Follow up with HARPER,CHARLES A, MD.   Specialty:  Obstetrics and Gynecology   Why:  As needed if symptoms worsen   Contact information:   [address] [place] [id number] [phone]       [facility] with [facility].   Why:  As needed in emergencies   Contact information:   [id number] [address] [place] [id number] [phone]         Medication List    STOP taking these medications        fluconazole 150 MG tablet  Commonly known as:  DIFLUCAN     medroxyPROGESTERone 150 MG/ML injection  Commonly known as:  DEPO-PROVERA      TAKE these medications        levonorgestrel-ethinyl estradiol 0.15-30 MG-MCG tablet  Commonly known as:  NORDETTE  Take 1 tablet by mouth as directed. Start new pack of pills. Take 3 pills per day x 3 days, 2 pills per days x 3 days then daily.       [place] Tylyn Stankovich, CNM 10/19/2014  11:11 AM

## 2014-11-27 ENCOUNTER — Encounter (HOSPITAL_COMMUNITY): Payer: Self-pay | Admitting: Emergency Medicine

## 2014-11-27 ENCOUNTER — Emergency Department (HOSPITAL_COMMUNITY)
Admission: EM | Admit: 2014-11-27 | Discharge: 2014-11-27 | Disposition: A | Discharge status: Home / Self Care | Payer: Medicaid Other | Attending: Emergency Medicine | Admitting: Emergency Medicine

## 2014-11-27 DIAGNOSIS — Z3202 Encounter for pregnancy test, result negative: Secondary | ICD-10-CM | POA: Diagnosis not present

## 2014-11-27 DIAGNOSIS — Z87891 Personal history of nicotine dependence: Secondary | ICD-10-CM | POA: Diagnosis not present

## 2014-11-27 DIAGNOSIS — S01452A Open bite of left cheek and temporomandibular area, initial encounter: Secondary | ICD-10-CM | POA: Diagnosis not present

## 2014-11-27 DIAGNOSIS — W503XXA Accidental bite by another person, initial encounter: Secondary | ICD-10-CM

## 2014-11-27 DIAGNOSIS — Z8659 Personal history of other mental and behavioral disorders: Secondary | ICD-10-CM | POA: Diagnosis not present

## 2014-11-27 DIAGNOSIS — Y9389 Activity, other specified: Secondary | ICD-10-CM | POA: Diagnosis not present

## 2014-11-27 DIAGNOSIS — Y9289 Other specified places as the place of occurrence of the external cause: Secondary | ICD-10-CM | POA: Diagnosis not present

## 2014-11-27 DIAGNOSIS — Z8619 Personal history of other infectious and parasitic diseases: Secondary | ICD-10-CM | POA: Insufficient documentation

## 2014-11-27 DIAGNOSIS — Y998 Other external cause status: Secondary | ICD-10-CM | POA: Diagnosis not present

## 2014-11-27 DIAGNOSIS — Z23 Encounter for immunization: Secondary | ICD-10-CM | POA: Insufficient documentation

## 2014-11-27 LAB — POC URINE PREG, ED: Preg Test, Ur: NEGATIVE

## 2014-11-27 MED ORDER — TETANUS-DIPHTH-ACELL PERTUSSIS 5-2.5-18.5 LF-MCG/0.5 IM SUSP
0.5000 mL | Freq: Once | INTRAMUSCULAR | Status: AC
  Administered 2014-11-27: 0.5 mL via INTRAMUSCULAR
  Filled 2014-11-27: qty 0.5

## 2014-11-27 MED ORDER — AMOXICILLIN-POT CLAVULANATE 875-125 MG PO TABS: 1.0000 | ORAL_TABLET | Freq: Two times a day (BID) | ORAL | Status: DC
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
## 2014-11-27 NOTE — Discharge Instructions (Signed)
Human Bite  Human bite wounds tend to become infected, even when they seem minor at first. Bite wounds of the hand can be serious because the tendons and joints are close to the skin. Infection can develop very rapidly, even in a matter of hours.   DIAGNOSIS   Your caregiver will most likely:   Take a detailed history of the bite injury.   Perform a wound exam.   Take your medical history.  Blood tests or X-rays may be performed. Sometimes, infected bite wounds are cultured and sent to a lab to identify the infectious bacteria.  TREATMENT   Medical treatment will depend on the location of the bite as well as the patient's medical history. Treatment may include:   Wound care, such as cleaning and flushing the wound with saline solution, bandaging, and elevating the affected area.   Antibiotic medicine.   Tetanus immunization.   Leaving the wound open to heal. This is often done with human bites due to the high risk of infection. However, in certain cases, wound closure with stitches, wound adhesive, skin adhesive strips, or staples may be used.  Infected bites that are left untreated may require intravenous (IV) antibiotics and surgical treatment in the hospital.  HOME CARE INSTRUCTIONS   Follow your caregiver's instructions for wound care.   Take all medicines as directed.   If your caregiver prescribes antibiotics, take them as directed. Finish them even if you start to feel better.   Follow up with your caregiver for further exams or immunizations as directed.  You may need a tetanus shot if:   You cannot remember when you had your last tetanus shot.   You have never had a tetanus shot.   The injury broke your skin.  If you get a tetanus shot, your arm may swell, get red, and feel warm to the touch. This is common and not a problem. If you need a tetanus shot and you choose not to have one, there is a rare chance of getting tetanus. Sickness from tetanus can be serious.  SEEK IMMEDIATE MEDICAL CARE  IF:   You have increased pain, swelling, or redness around the bite wound.   You have chills.   You have a fever.   You have pus draining from the wound.   You have red streaks on the skin coming from the wound.   You have pain with movement or trouble moving the injured part.   You are not improving, or you are getting worse.   You have any other questions or concerns.  MAKE SURE YOU:   Understand these instructions.   Will watch your condition.   Will get help right away if you are not doing well or get worse.  Document Released: 11/08/2004 Document Revised: 12/24/2011 Document Reviewed: 05/23/2011  [employer] Patient Information 2015 [employer]. This information is not intended to replace advice given to you by your health care provider. Make sure you discuss any questions you have with your health care provider.
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
## 2015-01-19 ENCOUNTER — Encounter (HOSPITAL_COMMUNITY): Payer: Self-pay | Admitting: *Deleted

## 2015-01-19 ENCOUNTER — Inpatient Hospital Stay (HOSPITAL_COMMUNITY)
Admission: AD | Admit: 2015-01-19 | Discharge: 2015-01-19 | Disposition: A | Discharge status: Home / Self Care | Payer: Medicaid Other | Source: Ambulatory Visit | Attending: Obstetrics | Admitting: Obstetrics

## 2015-01-19 DIAGNOSIS — B373 Candidiasis of vulva and vagina: Secondary | ICD-10-CM | POA: Diagnosis not present

## 2015-01-19 DIAGNOSIS — O98811 Other maternal infectious and parasitic diseases complicating pregnancy, first trimester: Secondary | ICD-10-CM

## 2015-01-19 DIAGNOSIS — Z87891 Personal history of nicotine dependence: Secondary | ICD-10-CM | POA: Insufficient documentation

## 2015-01-19 DIAGNOSIS — N912 Amenorrhea, unspecified: Secondary | ICD-10-CM | POA: Diagnosis not present

## 2015-01-19 DIAGNOSIS — N898 Other specified noninflammatory disorders of vagina: Secondary | ICD-10-CM | POA: Diagnosis present

## 2015-01-19 DIAGNOSIS — Z3202 Encounter for pregnancy test, result negative: Secondary | ICD-10-CM | POA: Diagnosis not present

## 2015-01-19 DIAGNOSIS — B3731 Acute candidiasis of vulva and vagina: Secondary | ICD-10-CM

## 2015-01-19 DIAGNOSIS — N926 Irregular menstruation, unspecified: Secondary | ICD-10-CM

## 2015-01-19 LAB — URINALYSIS, ROUTINE W REFLEX MICROSCOPIC
BILIRUBIN URINE: NEGATIVE
Glucose, UA: NEGATIVE mg/dL
HGB URINE DIPSTICK: NEGATIVE
KETONES UR: NEGATIVE mg/dL
Leukocytes, UA: NEGATIVE
Nitrite: NEGATIVE
Protein, ur: NEGATIVE mg/dL
SPECIFIC GRAVITY, URINE: 1.02 (ref 1.005–1.030)
UROBILINOGEN UA: 1 mg/dL (ref 0.0–1.0)
pH: 6.5 (ref 5.0–8.0)

## 2015-01-19 LAB — RAPID URINE DRUG SCREEN, HOSP PERFORMED
AMPHETAMINES: NOT DETECTED
BARBITURATES: NOT DETECTED
BENZODIAZEPINES: NOT DETECTED
Cocaine: NOT DETECTED
Opiates: NOT DETECTED
Tetrahydrocannabinol: POSITIVE — AB

## 2015-01-19 LAB — WET PREP, GENITAL
Clue Cells Wet Prep HPF POC: NONE SEEN
TRICH WET PREP: NONE SEEN

## 2015-01-19 LAB — POCT PREGNANCY, URINE: PREG TEST UR: POSITIVE — AB

## 2015-01-19 LAB — HIV ANTIBODY (ROUTINE TESTING W REFLEX): HIV SCREEN 4TH GENERATION: NONREACTIVE

## 2015-01-19 MED ORDER — TERCONAZOLE 0.4 % VA CREA: 1.0000 | TOPICAL_CREAM | Freq: Every day | VAGINAL | Status: DC

## 2015-01-19 NOTE — MAU Provider Note (Signed)
History     CSN: [id number]  Arrival date and time: 01/19/15 6213   None     Chief Complaint  Patient presents with  . Vaginal irritation, check for STD    HPI   Beverly Marquez is a 24 y.o. female G2P2002 at Unknown gestation who presents to MAU with complaints of abnormal vaginal discharge. The discharge is white in color and thick. She first noticed it one week ago. The discharge is present every single day.  She has not tried anything over the counter for the symptoms. She denies vaginal itching or burning. She does not have any new sexual partners.   OB History    Gravida Para Term Preterm AB TAB SAB Ectopic Multiple Living   Past Medical History  Diagnosis Date  . Trichomonas   . No pertinent past medical history   . Bipolar 1 disorder     Past Surgical History  Procedure Laterality Date  . No past surgeries      Family History  Problem Relation Age of Onset  . Other Neg Hx     History  Substance Use Topics  . Smoking status: Former Smoker    Types: Cigarettes  . Smokeless tobacco: Never Used  . Alcohol Use: No    Allergies: No Known Allergies  Prescriptions prior to admission  Medication Sig Dispense Refill Last Dose  . Prenatal Vit-Fe Fumarate-FA (PRENATAL MULTIVITAMIN) TABS tablet Take 1 tablet by mouth daily at 12 noon.   01/18/2015 at Unknown time  . amoxicillin-clavulanate (AUGMENTIN) 875-125 MG per tablet Take 1 tablet by mouth every 12 (twelve) hours. (Patient not taking: Reported on 01/19/2015) 14 tablet 0   . levonorgestrel-ethinyl estradiol (NORDETTE) 0.15-30 MG-MCG tablet Take 1 tablet by mouth as directed. Start new pack of pills. Take 3 pills per day x 3 days, 2 pills per days x 3 days then daily. (Patient not taking: Reported on 01/19/2015) 1 Package 11    Results for orders placed or performed during the hospital encounter of 01/19/15 (from the past 48 hour(s))  Urinalysis, Routine w reflex microscopic     Status:  None   Collection Time: 01/19/15  8:30 AM  Result Value Ref Range   Color, Urine YELLOW YELLOW   APPearance CLEAR CLEAR   Specific Gravity, Urine 1.020 1.005 - 1.030   pH 6.5 5.0 - 8.0   Glucose, UA NEGATIVE NEGATIVE mg/dL   Hgb urine dipstick NEGATIVE NEGATIVE   Bilirubin Urine NEGATIVE NEGATIVE   Ketones, ur NEGATIVE NEGATIVE mg/dL   Protein, ur NEGATIVE NEGATIVE mg/dL   Urobilinogen, UA 1.0 0.0 - 1.0 mg/dL   Nitrite NEGATIVE NEGATIVE   Leukocytes, UA NEGATIVE NEGATIVE    Comment: MICROSCOPIC NOT DONE ON URINES WITH NEGATIVE PROTEIN, BLOOD, LEUKOCYTES, NITRITE, OR GLUCOSE <1000 mg/dL.  Urine rapid drug screen (hosp performed)     Status: Abnormal   Collection Time: 01/19/15  8:30 AM  Result Value Ref Range   Opiates NONE DETECTED NONE DETECTED   Cocaine NONE DETECTED NONE DETECTED   Benzodiazepines NONE DETECTED NONE DETECTED   Amphetamines NONE DETECTED NONE DETECTED   Tetrahydrocannabinol POSITIVE (A) NONE DETECTED   Barbiturates NONE DETECTED NONE DETECTED    Comment:        DRUG SCREEN FOR MEDICAL PURPOSES ONLY.  IF CONFIRMATION IS NEEDED FOR ANY PURPOSE, NOTIFY LAB WITHIN 5 DAYS.  LOWEST DETECTABLE LIMITS FOR URINE DRUG SCREEN Drug Class       Cutoff (ng/mL) Amphetamine      1000 Barbiturate      200 Benzodiazepine   200 Tricyclics       300 Opiates          300 Cocaine          300 THC              50   Pregnancy, urine POC     Status: Abnormal   Collection Time: 01/19/15  9:13 AM  Result Value Ref Range   Preg Test, Ur POSITIVE (A) NEGATIVE    Comment:        THE SENSITIVITY OF THIS METHODOLOGY IS >24 mIU/mL   Wet prep, genital     Status: Abnormal   Collection Time: 01/19/15  9:19 AM  Result Value Ref Range   Yeast Wet Prep HPF POC FEW (A) NONE SEEN   Trich, Wet Prep NONE SEEN NONE SEEN   Clue Cells Wet Prep HPF POC NONE SEEN NONE SEEN   WBC, Wet Prep HPF POC FEW (A) NONE SEEN    Comment: MODERATE BACTERIA SEEN     Review of Systems   Constitutional: Negative for fever and chills.  Gastrointestinal: Negative for nausea, vomiting and abdominal pain.  Genitourinary: Negative for dysuria, urgency and frequency.   Physical Exam   Blood pressure 116/73, pulse 84, temperature 98.1 F (36.7 C), temperature source Oral, resp. rate 16, last menstrual period 12/26/2014.  Physical Exam  Constitutional: She is oriented to person, place, and time. She appears well-developed and well-nourished. No distress.  HENT:  Head: Normocephalic.  Eyes: Pupils are equal, round, and reactive to light.  Neck: Neck supple.  Respiratory: Effort normal.  Genitourinary:  GC and wet prep collected by RN   Musculoskeletal: Normal range of motion.  Neurological: She is alert and oriented to person, place, and time.  Skin: Skin is warm. She is not diaphoretic.  Psychiatric: Her behavior is normal.    MAU Course  Procedures  None  MDM  UA  Upt  UDS   HIV GC Wet prep  Assessment and Plan   A:  1. Yeast vaginitis   2. Missed period     P:  Discharge home in stable condition RX: Terazol Cream 7 day Start prenatal care ASAP First trimester warning signs discussed.  Condoms always    Duane LopeJennifer I Rasch, NP 01/19/2015 9:23 AM

## 2015-01-19 NOTE — Discharge Instructions (Signed)

## 2015-01-19 NOTE — MAU Note (Addendum)
Noted some vaginal irritation that started yesterday. Noted a thick D/ C a few days ago, but no odor or color. Today no D/C. Wants to R/O STD.

## 2015-01-20 LAB — GC/CHLAMYDIA PROBE AMP (~~LOC~~) NOT AT ARMC
CHLAMYDIA, DNA PROBE: NEGATIVE
Neisseria Gonorrhea: NEGATIVE

## 2015-01-21 ENCOUNTER — Encounter (HOSPITAL_COMMUNITY): Payer: Self-pay | Admitting: *Deleted

## 2015-01-21 ENCOUNTER — Inpatient Hospital Stay (HOSPITAL_COMMUNITY)
Admission: AD | Admit: 2015-01-21 | Discharge: 2015-01-21 | Disposition: A | Discharge status: Home / Self Care | Payer: Medicaid Other | Source: Ambulatory Visit | Attending: Obstetrics | Admitting: Obstetrics

## 2015-01-21 ENCOUNTER — Inpatient Hospital Stay (HOSPITAL_COMMUNITY): Payer: Medicaid Other

## 2015-01-21 ENCOUNTER — Telehealth: Payer: Self-pay | Admitting: *Deleted

## 2015-01-21 DIAGNOSIS — O9989 Other specified diseases and conditions complicating pregnancy, childbirth and the puerperium: Secondary | ICD-10-CM | POA: Insufficient documentation

## 2015-01-21 DIAGNOSIS — Z87891 Personal history of nicotine dependence: Secondary | ICD-10-CM | POA: Insufficient documentation

## 2015-01-21 DIAGNOSIS — R109 Unspecified abdominal pain: Secondary | ICD-10-CM

## 2015-01-21 DIAGNOSIS — Z3A01 Less than 8 weeks gestation of pregnancy: Secondary | ICD-10-CM | POA: Insufficient documentation

## 2015-01-21 DIAGNOSIS — O26899 Other specified pregnancy related conditions, unspecified trimester: Secondary | ICD-10-CM

## 2015-01-21 LAB — URINALYSIS, ROUTINE W REFLEX MICROSCOPIC
Bilirubin Urine: NEGATIVE
Glucose, UA: NEGATIVE mg/dL
Hgb urine dipstick: NEGATIVE
Ketones, ur: NEGATIVE mg/dL
LEUKOCYTES UA: NEGATIVE
Nitrite: NEGATIVE
PH: 6.5 (ref 5.0–8.0)
PROTEIN: NEGATIVE mg/dL
Specific Gravity, Urine: 1.02 (ref 1.005–1.030)
Urobilinogen, UA: 1 mg/dL (ref 0.0–1.0)

## 2015-01-21 LAB — HCG, QUANTITATIVE, PREGNANCY: HCG, BETA CHAIN, QUANT, S: 78 m[IU]/mL — AB (ref <5)

## 2015-01-21 NOTE — Telephone Encounter (Signed)
Patient requesting an appointment. Patient advised to follow up with MAU as recommended and once the pregnancy has been determined to be viable then contact the office to set up her NOB appointment. Patient verbalized understanding.

## 2015-01-21 NOTE — MAU Note (Signed)
Discharge instructions given to pt. Ectopic precautions reviewed with pt and she verbalizes understanding of all teaching and states she will come back Sunday for repeat BHCG or before if she begins to have pain

## 2015-01-21 NOTE — MAU Note (Signed)
Found out preg 2 days ago.  This morning had some sharp pain in lower abd.  Some cramping.

## 2015-01-21 NOTE — MAU Provider Note (Signed)
History     CSN: [id number]  Arrival date and time: 01/21/15 1026   None     Chief Complaint  Patient presents with  . Abdominal Cramping   HPI This is a 24 y.o. female at 8358w5d by LMP who presents with c/o one episode of sharp pain in lower abdomen this morning. This lasted only a few seconds. Did not radiate.  Since then has had some mild cramping. Denies bleeding.   RN Note:  Expand All Collapse All   Found out preg 2 days ago. This morning had some sharp pain in lower abd. Some cramping.          OB History    Gravida Para Term Preterm AB TAB SAB Ectopic Multiple Living   3 2 2       2       Past Medical History  Diagnosis Date  . Trichomonas   . No pertinent past medical history   . Bipolar 1 disorder     Past Surgical History  Procedure Laterality Date  . No past surgeries      Family History  Problem Relation Age of Onset  . Other Neg Hx     History  Substance Use Topics  . Smoking status: Former Smoker    Types: Cigarettes  . Smokeless tobacco: Never Used  . Alcohol Use: No    Allergies: No Known Allergies  Prescriptions prior to admission  Medication Sig Dispense Refill Last Dose  . Prenatal Vit-Fe Fumarate-FA (PRENATAL MULTIVITAMIN) TABS tablet Take 1 tablet by mouth daily at 12 noon.   01/18/2015 at Unknown time  . terconazole (TERAZOL 7) 0.4 % vaginal cream Place 1 applicator vaginally at bedtime. 45 g 0     Review of Systems  Constitutional: Negative for fever, chills and malaise/fatigue.  Gastrointestinal: Positive for abdominal pain. Negative for nausea, vomiting, diarrhea and constipation.  Genitourinary: Negative for dysuria, urgency and frequency.  Musculoskeletal: Negative for myalgias.  Neurological: Negative for dizziness, focal weakness, weakness and headaches.   Physical Exam   Blood pressure 113/56, pulse 82, temperature 97.9 F (36.6 C), temperature source Oral, resp. rate 16, height 5\' 4"  (1.626 m), weight 147 lb  (66.679 kg), last menstrual period 12/26/2014.  Physical Exam  Constitutional: She is oriented to person, place, and time. She appears well-developed and well-nourished. No distress.  HENT:  Head: Normocephalic.  Cardiovascular: Normal rate.   Respiratory: Effort normal.  GI: Soft. She exhibits no distension and no mass. There is no tenderness. There is no rebound and no guarding.  Genitourinary: Vagina normal. No vaginal discharge found.  Perineum normal no lesions Uterus small 4 wk size No adnexal tenderness   Musculoskeletal: Normal range of motion.  Neurological: She is alert and oriented to person, place, and time.  Skin: Skin is warm and dry.  Psychiatric: She has a normal mood and affect.  GC/Chlamydia done 01/19/15 and were negative Wet prep + for yeast only  MAU Course  Procedures  MDM Results for orders placed or performed during the hospital encounter of 01/21/15 (from the past 72 hour(s))  Urinalysis, Routine w reflex microscopic     Status: Abnormal   Collection Time: 01/21/15 10:45 AM  Result Value Ref Range   Color, Urine YELLOW YELLOW   APPearance HAZY (A) CLEAR   Specific Gravity, Urine 1.020 1.005 - 1.030   pH 6.5 5.0 - 8.0   Glucose, UA NEGATIVE NEGATIVE mg/dL   Hgb urine dipstick NEGATIVE NEGATIVE   Bilirubin  Urine NEGATIVE NEGATIVE   Ketones, ur NEGATIVE NEGATIVE mg/dL   Protein, ur NEGATIVE NEGATIVE mg/dL   Urobilinogen, UA 1.0 0.0 - 1.0 mg/dL   Nitrite NEGATIVE NEGATIVE   Leukocytes, UA NEGATIVE NEGATIVE    Comment: MICROSCOPIC NOT DONE ON URINES WITH NEGATIVE PROTEIN, BLOOD, LEUKOCYTES, NITRITE, OR GLUCOSE <1000 mg/dL.  hCG, quantitative, pregnancy     Status: Abnormal   Collection Time: 01/21/15 10:55 AM  Result Value Ref Range   hCG, Beta Chain, Quant, S 78 (H) <5 mIU/mL    Comment:          GEST. AGE      CONC.  (mIU/mL)   <=1 WEEK        5 - 50     2 WEEKS       50 - 500     3 WEEKS       100 - 10,000     4 WEEKS     1,000 - 30,000     5  WEEKS     3,500 - 115,000   6-8 WEEKS     12,000 - 270,000    12 WEEKS     15,000 - 220,000        FEMALE AND NON-PREGNANT FEMALE:     LESS THAN 5 mIU/mL      Assessment and Plan  A:  Pregnancy at [redacted]w[redacted]d by LMP       Intermittent rare sharp abdominal pain      No bleeding      Cannot rule out ectopic yet.  P:  Discussed findings with patient       Discussed usual methods of ruling out ectopic pregnancy.       Patient decided to leave before US done.       Reviewed risks of undetected ectopic pregnancy and need to return in 2 days for Quant HCG level  [facility] 01/21/2015, 10:46 AM

## 2015-01-21 NOTE — Discharge Instructions (Signed)
Abdominal Pain During Pregnancy Belly (abdominal) pain is common during pregnancy. Most of the time, it is not a serious problem. Other times, it can be a sign that something is wrong with the pregnancy. Always tell your doctor if you have belly pain. HOME CARE Monitor your belly pain for any changes. The following actions may help you feel better:  Do not have sex (intercourse) or put anything in your vagina until you feel better.  Rest until your pain stops.  Drink clear fluids if you feel sick to your stomach (nauseous). Do not eat solid food until you feel better.  Only take medicine as told by your doctor.  Keep all doctor visits as told. GET HELP RIGHT AWAY IF:   You are bleeding, leaking fluid, or pieces of tissue come out of your vagina.  You have more pain or cramping.  You keep throwing up (vomiting).  You have pain when you pee (urinate) or have blood in your pee.  You have a fever.  You do not feel your baby moving as much.  You feel very weak or feel like passing out.  You have trouble breathing, with or without belly pain.  You have a very bad headache and belly pain.  You have fluid leaking from your vagina and belly pain.  You keep having watery poop (diarrhea).  Your belly pain does not go away after resting, or the pain gets worse. MAKE SURE YOU:   Understand these instructions.  Will watch your condition.  Will get help right away if you are not doing well or get worse. Document Released: 09/19/2009 Document Revised: 06/03/2013 Document Reviewed: 04/30/2013 [facility] Patient Information 2015 [place], [place]. This information is not intended to replace advice given to you by your health care provider. Make sure you discuss any questions you have with your health care provider. First Trimester of Pregnancy The first trimester of pregnancy is from week 1 until the end of week 12 (months 1 through 3). A week after a sperm fertilizes an egg, the egg will  implant on the wall of the uterus. This embryo will begin to develop into a baby. Genes from you and your partner are forming the baby. The female genes determine whether the baby is a boy or a girl. At 6-8 weeks, the eyes and face are formed, and the heartbeat can be seen on ultrasound. At the end of 12 weeks, all the baby's organs are formed.  Now that you are pregnant, you will want to do everything you can to have a healthy baby. Two of the most important things are to get good prenatal care and to follow your health care provider's instructions. Prenatal care is all the medical care you receive before the baby's birth. This care will help prevent, find, and treat any problems during the pregnancy and childbirth. BODY CHANGES Your body goes through many changes during pregnancy. The changes vary from woman to woman.   You may gain or lose a couple of pounds at first.  You may feel sick to your stomach (nauseous) and throw up (vomit). If the vomiting is uncontrollable, call your health care provider.  You may tire easily.  You may develop headaches that can be relieved by medicines approved by your health care provider.  You may urinate more often. Painful urination may mean you have a bladder infection.  You may develop heartburn as a result of your pregnancy.  You may develop constipation because certain hormones are causing the  muscles that push waste through your intestines to slow down.  You may develop hemorrhoids or swollen, bulging veins (varicose veins).  Your breasts may begin to grow larger and become tender. Your nipples may stick out more, and the tissue that surrounds them (areola) may become darker.  Your gums may bleed and may be sensitive to brushing and flossing.  Dark spots or blotches (chloasma, mask of pregnancy) may develop on your face. This will likely fade after the baby is born.  Your menstrual periods will stop.  You may have a loss of appetite.  You may  develop cravings for certain kinds of food.  You may have changes in your emotions from day to day, such as being excited to be pregnant or being concerned that something may go wrong with the pregnancy and baby.  You may have more vivid and strange dreams.  You may have changes in your hair. These can include thickening of your hair, rapid growth, and changes in texture. Some women also have hair loss during or after pregnancy, or hair that feels dry or thin. Your hair will most likely return to normal after your baby is born. WHAT TO EXPECT AT YOUR PRENATAL VISITS During a routine prenatal visit:  You will be weighed to make sure you and the baby are growing normally.  Your blood pressure will be taken.  Your abdomen will be measured to track your baby's growth.  The fetal heartbeat will be listened to starting around week 10 or 12 of your pregnancy.  Test results from any previous visits will be discussed. Your health care provider may ask you:  How you are feeling.  If you are feeling the baby move.  If you have had any abnormal symptoms, such as leaking fluid, bleeding, severe headaches, or abdominal cramping.  If you have any questions. Other tests that may be performed during your first trimester include:  Blood tests to find your blood type and to check for the presence of any previous infections. They will also be used to check for low iron levels (anemia) and Rh antibodies. Later in the pregnancy, blood tests for diabetes will be done along with other tests if problems develop.  Urine tests to check for infections, diabetes, or protein in the urine.  An ultrasound to confirm the proper growth and development of the baby.  An amniocentesis to check for possible genetic problems.  Fetal screens for spina bifida and Down syndrome.  You may need other tests to make sure you and the baby are doing well. HOME CARE INSTRUCTIONS  Medicines  Follow your health care  provider's instructions regarding medicine use. Specific medicines may be either safe or unsafe to take during pregnancy.  Take your prenatal vitamins as directed.  If you develop constipation, try taking a stool softener if your health care provider approves. Diet  Eat regular, well-balanced meals. Choose a variety of foods, such as meat or vegetable-based protein, fish, milk and low-fat dairy products, vegetables, fruits, and whole grain breads and cereals. Your health care provider will help you determine the amount of weight gain that is right for you.  Avoid raw meat and uncooked cheese. These carry germs that can cause birth defects in the baby.  Eating four or five small meals rather than three large meals a day may help relieve nausea and vomiting. If you start to feel nauseous, eating a few soda crackers can be helpful. Drinking liquids between meals instead of during meals also  seems to help nausea and vomiting.  If you develop constipation, eat more high-fiber foods, such as fresh vegetables or fruit and whole grains. Drink enough fluids to keep your urine clear or pale yellow. Activity and Exercise  Exercise only as directed by your health care provider. Exercising will help you:  Control your weight.  Stay in shape.  Be prepared for labor and delivery.  Experiencing pain or cramping in the lower abdomen or low back is a good sign that you should stop exercising. Check with your health care provider before continuing normal exercises.  Try to avoid standing for long periods of time. Move your legs often if you must stand in one place for a long time.  Avoid heavy lifting.  Wear low-heeled shoes, and practice good posture.  You may continue to have sex unless your health care provider directs you otherwise. Relief of Pain or Discomfort  Wear a good support bra for breast tenderness.   Take warm sitz baths to soothe any pain or discomfort caused by hemorrhoids. Use  hemorrhoid cream if your health care provider approves.   Rest with your legs elevated if you have leg cramps or low back pain.  If you develop varicose veins in your legs, wear support hose. Elevate your feet for 15 minutes, 3-4 times a day. Limit salt in your diet. Prenatal Care  Schedule your prenatal visits by the twelfth week of pregnancy. They are usually scheduled monthly at first, then more often in the last 2 months before delivery.  Write down your questions. Take them to your prenatal visits.  Keep all your prenatal visits as directed by your health care provider. Safety  Wear your seat belt at all times when driving.  Make a list of emergency phone numbers, including numbers for family, friends, the hospital, and police and fire departments. General Tips  Ask your health care provider for a referral to a local prenatal education class. Begin classes no later than at the beginning of month 6 of your pregnancy.  Ask for help if you have counseling or nutritional needs during pregnancy. Your health care provider can offer advice or refer you to specialists for help with various needs.  Do not use hot tubs, steam rooms, or saunas.  Do not douche or use tampons or scented sanitary pads.  Do not cross your legs for long periods of time.  Avoid cat litter boxes and soil used by cats. These carry germs that can cause birth defects in the baby and possibly loss of the fetus by miscarriage or stillbirth.  Avoid all smoking, herbs, alcohol, and medicines not prescribed by your health care provider. Chemicals in these affect the formation and growth of the baby.  Schedule a dentist appointment. At home, brush your teeth with a soft toothbrush and be gentle when you floss. SEEK MEDICAL CARE IF:   You have dizziness.  You have mild pelvic cramps, pelvic pressure, or nagging pain in the abdominal area.  You have persistent nausea, vomiting, or diarrhea.  You have a bad  smelling vaginal discharge.  You have pain with urination.  You notice increased swelling in your face, hands, legs, or ankles. SEEK IMMEDIATE MEDICAL CARE IF:   You have a fever.  You are leaking fluid from your vagina.  You have spotting or bleeding from your vagina.  You have severe abdominal cramping or pain.  You have rapid weight gain or loss.  You vomit blood or material that looks like coffee  grounds.  You are exposed to [place] measles and have never had them.  You are exposed to fifth disease or chickenpox.  You develop a severe headache.  You have shortness of breath.  You have any kind of trauma, such as from a fall or a car accident. Document Released: 09/25/2001 Document Revised: 02/15/2014 Document Reviewed: 08/11/2013 [facility] Patient Information 2015 [place], [place]. This information is not intended to replace advice given to you by your health care provider. Make sure you discuss any questions you have with your health care provider.

## 2015-01-23 ENCOUNTER — Inpatient Hospital Stay (HOSPITAL_COMMUNITY): Payer: Medicaid Other

## 2015-01-23 ENCOUNTER — Inpatient Hospital Stay (HOSPITAL_COMMUNITY)
Admission: AD | Admit: 2015-01-23 | Discharge: 2015-01-23 | Discharge status: Against Medical Advice | Payer: Medicaid Other | Source: Ambulatory Visit | Attending: Obstetrics | Admitting: Obstetrics

## 2015-01-23 DIAGNOSIS — N8320 Unspecified ovarian cysts: Secondary | ICD-10-CM | POA: Insufficient documentation

## 2015-01-23 DIAGNOSIS — R109 Unspecified abdominal pain: Secondary | ICD-10-CM | POA: Diagnosis not present

## 2015-01-23 DIAGNOSIS — O26899 Other specified pregnancy related conditions, unspecified trimester: Secondary | ICD-10-CM

## 2015-01-23 DIAGNOSIS — O9989 Other specified diseases and conditions complicating pregnancy, childbirth and the puerperium: Secondary | ICD-10-CM | POA: Diagnosis not present

## 2015-01-23 DIAGNOSIS — Z3A01 Less than 8 weeks gestation of pregnancy: Secondary | ICD-10-CM | POA: Insufficient documentation

## 2015-01-23 DIAGNOSIS — O3481 Maternal care for other abnormalities of pelvic organs, first trimester: Secondary | ICD-10-CM | POA: Insufficient documentation

## 2015-01-23 LAB — HCG, QUANTITATIVE, PREGNANCY: HCG, BETA CHAIN, QUANT, S: 160 m[IU]/mL — AB (ref <5)

## 2015-01-23 NOTE — MAU Note (Signed)
Pt not in lobby at [id number]

## 2015-01-23 NOTE — MAU Note (Signed)
Pt not in lobby at [phone] or [phone]

## 2015-01-23 NOTE — MAU Note (Signed)
Pt left from lobby after coming back from ultrasound. Unable to discuss ultrasound results or discharge plan with patient.

## 2015-01-23 NOTE — Discharge Instructions (Signed)

## 2015-01-23 NOTE — MAU Provider Note (Signed)
History   Chief Complaint:  Labs Only   Beverly Marquez is  24 y.o. [id number] Patient'Marquez last menstrual period was 12/26/2014.Marland Kitchen. Patient is here for follow up of quantitative HCG and ongoing surveillance of pregnancy status.   She is 8858w0d weeks gestation  by LMP.  She was seen in maternity admissions 01/21/2015 for abdominal pain in pregnancy. She left before pelvic ultrasound was performed.  Since her last visit, the patient is without new complaint.   The patient reports bleeding as  none now.    General ROS:  negative for fever, chills, abd pain, VB  Her previous Quantitative HCG values are:  Results for Beverly Marquez (MRN [id number]) as of 01/23/2015 16:33  Ref. Range 01/21/2015 10:55  hCG, Beta Chain, Quant, Marquez Latest Range: <5 mIU/mL 78 (H)    Physical Exam   Blood pressure 123/58, pulse 80, last menstrual period 12/26/2014.  Focused Gynecological Exam: examination not indicated  Labs: Results for orders placed or performed during the hospital encounter of 01/23/15 (from the past 24 hour(Marquez))  hCG, quantitative, pregnancy   Collection Time: 01/23/15  4:02 PM  Result Value Ref Range   hCG, Beta Chain, Quant, Marquez 160 (H) <5 mIU/mL    Ultrasound Studies:   [place] Ob Comp Less 14 Wks  01/23/2015   CLINICAL DATA:  Pregnant patient with cramping on 01/21/2015. Beta HCG 160, beta HCG on 01/21/15 of 78.  EXAM: OBSTETRIC <14 WK US AND TRANSVAGINAL OB US  TECHNIQUE: Both transabdominal and transvaginal ultrasound examinations were performed for complete evaluation of the gestation as well as the maternal uterus, adnexal regions, and pelvic cul-de-sac. Transvaginal technique was performed to assess early pregnancy.  COMPARISON:  None.  FINDINGS: Intrauterine gestational sac: Not visualized.  Yolk sac:  Not visualized.  Embryo:  Not visualized.  Maternal uterus/adnexae: The endometrium is heterogeneous and mildly thickened measuring 12 mm. There is no fluid in the endometrial canal. The left  ovary measures 3.0 x 1.6 x 1.8 cm and appears normal. The right ovary measures 4.5 x 3.2 x 4.5 cm and contains 2 simple cysts, measuring 2.7 x 2.6 x 2.4 cm, and 3.2 x 2.6 x 2.3 cm. There is blood flow to the ovarian parenchyma. Small amount of simple free fluid in the pelvis.  IMPRESSION: 1. No intrauterine pregnancy. Mildly heterogeneous and thickened endometrium, this may reflect very early pregnancy. Recommend follow-up quantitative B-HCG levels and follow-up US to confirm and assess viability. 2. Two simple cysts in the right ovary, measuring 3.2 and 2.7 cm respectively. Normal blood flow to the ovarian parenchyma. These could be further assessed on follow-up ultrasound.   Electronically Signed   By: Rubye OaksMelanie  Ehinger M.D.   On: 01/23/2015 19:17   [place] Ob Transvaginal  01/23/2015   CLINICAL DATA:  Pregnant patient with cramping on 01/21/2015. Beta HCG 160, beta HCG on 01/21/15 of 78.  EXAM: OBSTETRIC <14 WK US AND TRANSVAGINAL OB US  TECHNIQUE: Both transabdominal and transvaginal ultrasound examinations were performed for complete evaluation of the gestation as well as the maternal uterus, adnexal regions, and pelvic cul-de-sac. Transvaginal technique was performed to assess early pregnancy.  COMPARISON:  None.  FINDINGS: Intrauterine gestational sac: Not visualized.  Yolk sac:  Not visualized.  Embryo:  Not visualized.  Maternal uterus/adnexae: The endometrium is heterogeneous and mildly thickened measuring 12 mm. There is no fluid in the endometrial canal. The left ovary measures 3.0 x 1.6 x 1.8 cm and appears normal. The right ovary measures 4.5  x 3.2 x 4.5 cm and contains 2 simple cysts, measuring 2.7 x 2.6 x 2.4 cm, and 3.2 x 2.6 x 2.3 cm. There is blood flow to the ovarian parenchyma. Small amount of simple free fluid in the pelvis.  IMPRESSION: 1. No intrauterine pregnancy. Mildly heterogeneous and thickened endometrium, this may reflect very early pregnancy. Recommend follow-up quantitative B-HCG levels  and follow-up US to confirm and assess viability. 2. Two simple cysts in the right ovary, measuring 3.2 and 2.7 cm respectively. Normal blood flow to the ovarian parenchyma. These could be further assessed on follow-up ultrasound.   Electronically Signed   By: Rubye Oaks M.D.   On: 01/23/2015 19:17   Assessment: [redacted]w[redacted]d weeks gestation with normal rise in hCG Simple ovarian cysts Abdominal pain in pregnancy.  Pregnancy of unknown location  Plan: Discharge home in stable condition. Follow-up ultrasound and proximally 10 days to determine pregnancy location, dating, viability SAB and ectopic precautions. Follow-up Information    Follow up with [facility] [place] [facility].   Specialty:  Radiology   Why:  Will call you to schedule a follow-up ultrasound.   Contact information:   [address] [id number] mc [place] [address] [phone]      Follow up with [facility] [place] MATERNITY ADMISSIONS.   Why:  As needed in emergencies   Contact information:   [address] [id number] mc [place] [address] [phone]        Medication List    TAKE these medications        prenatal multivitamin Tabs tablet  Take 1 tablet by mouth daily at 12 noon.     terconazole 0.4 % vaginal cream  Commonly known as:  TERAZOL 7  Place 1 applicator vaginally at bedtime.       Dorathy Kinsman 01/23/2015, 4:32 PM

## 2015-01-23 NOTE — MAU Note (Signed)
Pt presents to MAU for repeat BHCG. Denies any pain or vaginal bleeding 

## 2015-02-02 ENCOUNTER — Ambulatory Visit (HOSPITAL_COMMUNITY)
Admission: RE | Admit: 2015-02-02 | Discharge: 2015-02-02 | Disposition: A | Discharge status: Home / Self Care | Payer: Medicaid Other | Source: Ambulatory Visit | Attending: Advanced Practice Midwife | Admitting: Advanced Practice Midwife

## 2015-02-02 ENCOUNTER — Inpatient Hospital Stay (HOSPITAL_COMMUNITY)
Admission: AD | Admit: 2015-02-02 | Discharge: 2015-02-02 | Disposition: A | Discharge status: Home / Self Care | Payer: Medicaid Other | Source: Ambulatory Visit | Attending: Obstetrics | Admitting: Obstetrics

## 2015-02-02 DIAGNOSIS — O26899 Other specified pregnancy related conditions, unspecified trimester: Secondary | ICD-10-CM

## 2015-02-02 DIAGNOSIS — R109 Unspecified abdominal pain: Secondary | ICD-10-CM | POA: Diagnosis not present

## 2015-02-02 DIAGNOSIS — O9989 Other specified diseases and conditions complicating pregnancy, childbirth and the puerperium: Secondary | ICD-10-CM | POA: Diagnosis present

## 2015-02-02 DIAGNOSIS — O3680X0 Pregnancy with inconclusive fetal viability, not applicable or unspecified: Secondary | ICD-10-CM

## 2015-02-02 NOTE — MAU Provider Note (Signed)
  History     CSN: [id number]  Arrival date and time: 02/02/15 1332   None     No chief complaint on file.  HPI Beverly Marquez 24 y.o. [id number] @[redacted]w[redacted]d  presents to MAU for u/s results. She denies abdominal pain/vaginal bleeding/dysuria/fever.   OB History    Gravida Para Term Preterm AB TAB SAB Ectopic Multiple Living   3 2 2       2       Past Medical History  Diagnosis Date  . Trichomonas   . No pertinent past medical history   . Bipolar 1 disorder     Past Surgical History  Procedure Laterality Date  . No past surgeries      Family History  Problem Relation Age of Onset  . Other Neg Hx     History  Substance Use Topics  . Smoking status: Former Smoker    Types: Cigarettes  . Smokeless tobacco: Never Used  . Alcohol Use: No    Allergies: No Known Allergies  Prescriptions prior to admission  Medication Sig Dispense Refill Last Dose  . Prenatal Vit-Fe Fumarate-FA (PRENATAL MULTIVITAMIN) TABS tablet Take 1 tablet by mouth daily at 12 noon.   01/21/2015 at Unknown time  . terconazole (TERAZOL 7) 0.4 % vaginal cream Place 1 applicator vaginally at bedtime. 45 g 0 01/20/2015 at Unknown time    ROS other than those in HPI, all other ROS are negative Physical Exam   Last menstrual period 12/26/2014.  Physical Exam  Constitutional: She is oriented to person, place, and time. She appears well-developed and well-nourished. No distress.  HENT:  Head: Normocephalic and atraumatic.  Eyes: EOM are normal.  Neck: Normal range of motion.  Respiratory: Effort normal. No respiratory distress.  Musculoskeletal: Normal range of motion.  Neurological: She is alert and oriented to person, place, and time.  Psychiatric: She has a normal mood and affect. Her behavior is normal.    MAU Course  Procedures  MDM Discussed with Dr. Erin FullingHarraway-Smith.  NO need to follow HCG at this time.  Does need u/s at 6-7 weeks for viability.  Assessment and Plan  A: pregnancy of  undetermined location  P: Discharge to home Needs u/s at 6-7 weeks.  When informed of this, she states [facility] Gaynell FaceMarshall tomorrow.  Presumably, he will confirm location of pregnancy.  If this does not occur, pt advised she should return to MAU for us to order this.   Patient may return to MAU as needed or if her condition were to change or worsen   Beverly Marquez, Karen E 02/02/2015, 2:15 PM

## 2015-02-05 ENCOUNTER — Inpatient Hospital Stay (HOSPITAL_COMMUNITY)
Admission: AD | Admit: 2015-02-05 | Discharge: 2015-02-05 | Disposition: A | Discharge status: Home / Self Care | Payer: Medicaid Other | Source: Ambulatory Visit | Attending: Obstetrics | Admitting: Obstetrics

## 2015-02-05 ENCOUNTER — Inpatient Hospital Stay (HOSPITAL_COMMUNITY): Payer: Medicaid Other

## 2015-02-05 ENCOUNTER — Encounter (HOSPITAL_COMMUNITY): Payer: Self-pay | Admitting: *Deleted

## 2015-02-05 DIAGNOSIS — Z87891 Personal history of nicotine dependence: Secondary | ICD-10-CM | POA: Diagnosis not present

## 2015-02-05 DIAGNOSIS — O200 Threatened abortion: Secondary | ICD-10-CM

## 2015-02-05 DIAGNOSIS — Z3A01 Less than 8 weeks gestation of pregnancy: Secondary | ICD-10-CM | POA: Insufficient documentation

## 2015-02-05 DIAGNOSIS — O3680X0 Pregnancy with inconclusive fetal viability, not applicable or unspecified: Secondary | ICD-10-CM

## 2015-02-05 DIAGNOSIS — O209 Hemorrhage in early pregnancy, unspecified: Secondary | ICD-10-CM

## 2015-02-05 LAB — URINALYSIS, ROUTINE W REFLEX MICROSCOPIC
BILIRUBIN URINE: NEGATIVE
Glucose, UA: NEGATIVE mg/dL
Ketones, ur: NEGATIVE mg/dL
Leukocytes, UA: NEGATIVE
NITRITE: NEGATIVE
PH: 7 (ref 5.0–8.0)
Protein, ur: NEGATIVE mg/dL
SPECIFIC GRAVITY, URINE: 1.01 (ref 1.005–1.030)
Urobilinogen, UA: 0.2 mg/dL (ref 0.0–1.0)

## 2015-02-05 LAB — URINE MICROSCOPIC-ADD ON: RBC / HPF: NONE SEEN RBC/hpf (ref <3)

## 2015-02-05 LAB — HCG, QUANTITATIVE, PREGNANCY: hCG, Beta Chain, Quant, S: 1282 m[IU]/mL — ABNORMAL HIGH (ref <5)

## 2015-02-05 NOTE — MAU Note (Signed)
Pt noticed some brown spotting when she got up to the bathroom this morning.  Denies cramping.  Last intercourse 02/02/2015.

## 2015-02-05 NOTE — MAU Provider Note (Signed)
History     CSN: [id number]  Arrival date and time: 02/05/15 [id number]   First Provider Initiated Contact with Patient 02/05/15 1003      Chief Complaint  Patient presents with  . Vaginal Bleeding   HPI   [name] is a [id number] at 5847w2d who presents to MAU with vaginal bleeding; the bleeding started this morning, no intercourse recently. She has been here several times in the last few days and had her hcg levels followed. This morning she got up to use the bathroom and noted brown vaginal discharge on the toilet paper. She has not had any other bleeding in this pregnancy.  The bleeding is a very small amount, she had to wear a panty liner only.  She is scheduled to see Dr. Gaynell FaceMarshall on May 5th.   She denies pain.   OB History    Gravida Para Term Preterm AB TAB SAB Ectopic Multiple Living   3 2 2       2       Past Medical History  Diagnosis Date  . Trichomonas   . No pertinent past medical history   . Bipolar 1 disorder     Past Surgical History  Procedure Laterality Date  . No past surgeries      Family History  Problem Relation Age of Onset  . Other Neg Hx     History  Substance Use Topics  . Smoking status: Former Smoker    Types: Cigarettes  . Smokeless tobacco: Never Used  . Alcohol Use: No    Allergies: No Known Allergies  Prescriptions prior to admission  Medication Sig Dispense Refill Last Dose  . Prenatal Vit-Fe Fumarate-FA (PRENATAL MULTIVITAMIN) TABS tablet Take 1 tablet by mouth daily at 12 noon.   02/04/2015 at Unknown time  . terconazole (TERAZOL 7) 0.4 % vaginal cream Place 1 applicator vaginally at bedtime. (Patient not taking: Reported on 02/05/2015) 45 g 0 01/20/2015 at Unknown time   Results for orders placed or performed during the hospital encounter of 02/05/15 (from the past 48 hour(s))  Urinalysis, Routine w reflex microscopic     Status: Abnormal   Collection Time: 02/05/15  9:47 AM  Result Value Ref Range   Color, Urine YELLOW  YELLOW   APPearance CLEAR CLEAR   Specific Gravity, Urine 1.010 1.005 - 1.030   pH 7.0 5.0 - 8.0   Glucose, UA NEGATIVE NEGATIVE mg/dL   Hgb urine dipstick TRACE (A) NEGATIVE   Bilirubin Urine NEGATIVE NEGATIVE   Ketones, ur NEGATIVE NEGATIVE mg/dL   Protein, ur NEGATIVE NEGATIVE mg/dL   Urobilinogen, UA 0.2 0.0 - 1.0 mg/dL   Nitrite NEGATIVE NEGATIVE   Leukocytes, UA NEGATIVE NEGATIVE  Urine microscopic-add on     Status: Abnormal   Collection Time: 02/05/15  9:47 AM  Result Value Ref Range   Squamous Epithelial / LPF FEW (A) RARE   RBC / HPF  <3 RBC/hpf    NO FORMED ELEMENTS SEEN ON URINE MICROSCOPIC EXAMINATION   Urine-Other MUCOUS PRESENT   hCG, quantitative, pregnancy     Status: Abnormal   Collection Time: 02/05/15  9:55 AM  Result Value Ref Range   hCG, Beta Chain, Quant, S 1282 (H) <5 mIU/mL    Comment:          GEST. AGE      CONC.  (mIU/mL)   <=1 WEEK        5 - 50     2 WEEKS  50 - 500     3 WEEKS       100 - 10,000     4 WEEKS     1,000 - 30,000     5 WEEKS     3,500 - 115,000   6-8 WEEKS     12,000 - 270,000    12 WEEKS     15,000 - 220,000        FEMALE AND NON-PREGNANT FEMALE:     LESS THAN 5 mIU/mL     US Ob Transvaginal  02/05/2015   CLINICAL DATA:  Vaginal bleeding, followup intrauterine pregnancy  EXAM: TRANSVAGINAL OB ULTRASOUND  TECHNIQUE: Transvaginal ultrasound was performed for complete evaluation of the gestation as well as the maternal uterus, adnexal regions, and pelvic cul-de-sac.  COMPARISON:  02/02/2015, 01/23/2015  FINDINGS: Intrauterine gestational sac: Visualized/normal in shape.  Yolk sac:  Not visualized  Embryo:  Not visualized  Cardiac Activity: Not visualized  MSD: 5  mm   5 w   0  d  Maternal uterus/adnexae: Ovaries are normal in appearance. Trace free fluid.  IMPRESSION: Minimal interval growth of previously seen intrauterine gestational sac but no development of yolk sac, fetal pole, or cardiac activity in the short time interval  since the most recent prior exam dated 02/02/2015. Followup ultrasound is recommended in 10-14 days to document appropriate pregnancy progression and for the purposes of accurate dating.   Electronically Signed   By: Christiana Pellant M.D.   On: 02/05/2015 12:48    Review of Systems  Constitutional: Negative for fever and chills.  Gastrointestinal: Negative for nausea, vomiting and abdominal pain.   Physical Exam   Blood pressure 113/56, pulse 68, temperature 98 F (36.7 C), temperature source Oral, resp. rate 18, height  (1.626 m), weight 67.767 kg (149 lb 6.4 oz), last menstrual period 12/26/2014.  Physical Exam  Constitutional: She appears well-developed and well-nourished. No distress.  HENT:  Head: Normocephalic.  Eyes: Pupils are equal, round, and reactive to light.  Neck: Neck supple.  Genitourinary:  Speculum exam: Vagina - Small amount of creamy, dark red blood pooling in the vagina.  Cervix - + contact bleeding  Bimanual exam: Cervix closed, posterior  Uterus non tender, normal size Adnexa non tender, no masses bilaterally Chaperone present for exam.  Skin: She is not diaphoretic.  Psychiatric: She is agitated. She exhibits a depressed mood.    MAU Course  Procedures  None  MDM O positive blood type  Quants have not risen appropriately since 4/10; discussed labs and [place] with the patient; poor prognoses discussed.    Assessment and Plan   A:  1. Threatened miscarriage in early pregnancy   2. Vaginal bleeding in pregnancy, first trimester   3. Pregnancy of unknown anatomic location     P;  Discharge home in stable condtition  Return to MAU for quant Bleeding precautions Pelvic rest  Return to MAU if symptoms worsen Support given    Duane Lope, NP 02/05/2015 1:24 PM

## 2015-02-07 ENCOUNTER — Inpatient Hospital Stay (HOSPITAL_COMMUNITY)
Admission: AD | Admit: 2015-02-07 | Discharge: 2015-02-07 | Disposition: A | Discharge status: Home / Self Care | Payer: Medicaid Other | Source: Ambulatory Visit | Attending: Obstetrics & Gynecology | Admitting: Obstetrics & Gynecology

## 2015-02-07 DIAGNOSIS — Z3A01 Less than 8 weeks gestation of pregnancy: Secondary | ICD-10-CM | POA: Insufficient documentation

## 2015-02-07 DIAGNOSIS — O039 Complete or unspecified spontaneous abortion without complication: Secondary | ICD-10-CM | POA: Diagnosis present

## 2015-02-07 LAB — HCG, QUANTITATIVE, PREGNANCY: HCG, BETA CHAIN, QUANT, S: 898 m[IU]/mL — AB (ref <5)

## 2015-02-07 NOTE — MAU Note (Signed)
Here for follow up, recheck hormone level.  Feels fine, no pain, but continues to bleed- like on period.

## 2015-02-07 NOTE — Discharge Instructions (Signed)
Miscarriage °A miscarriage is the loss of an unborn baby (fetus) before the 20th week of pregnancy. The cause is often unknown.  °HOME CARE °· You may need to stay in bed (bed rest), or you may be able to do light activity. Go about activity as told by your doctor. °· Have help at home. °· Write down how many pads you use each day. Write down how soaked they are. °· Do not use tampons. Do not wash out your vagina (douche) or have sex (intercourse) until your doctor approves. °· Only take medicine as told by your doctor. °· Do not take aspirin. °· Keep all doctor visits as told. °· If you or your partner have problems with grieving, talk to your doctor. You can also try counseling. Give yourself time to grieve before trying to get pregnant again. °GET HELP RIGHT AWAY IF: °· You have bad cramps or pain in your back or belly (abdomen). °· You have a fever. °· You pass large clumps of blood (clots) from your vagina that are walnut-sized or larger. Save the clumps for your doctor to see. °· You pass large amounts of tissue from your vagina. Save the tissue for your doctor to see. °· You have more bleeding. °· You have thick, bad-smelling fluid (discharge) coming from the vagina. °· You get lightheaded, weak, or you pass out (faint). °· You have chills. °MAKE SURE YOU: °· Understand these instructions. °· Will watch your condition. °· Will get help right away if you are not doing well or get worse. °Document Released: 12/24/2011 Document Reviewed: 12/24/2011 °[employer]® Patient Information ©2015 [employer]. This information is not intended to replace advice given to you by your health care provider. Make sure you discuss any questions you have with your health care provider. ° °

## 2015-02-07 NOTE — MAU Provider Note (Signed)
Subjective:  [name] is a 24 y.o. female G3P2002 at 4274w4d who presents for a follow up beta hcg level. She was seen here, two days ago for vaginal bleeding. She has been following last week and had one appropriate rise in beta hcg. Her last quant did not rise appropriately. Currently she continues to have bleeding like a period, and denies pain.   Last [place] showed no growth in gestational sac from initial US. No yolk sac was identified.     Objective:  GENERAL: Well-developed, well-nourished female in no acute distress.  LUNGS: Effort normal SKIN: Warm, dry and without erythema PSYCH: Normal mood and affect  Filed Vitals:   02/07/15 0802  BP: 122/72  Pulse: 68  Temp: 98.2 F (36.8 C)  TempSrc: Oral  Resp: 16   MDM O positive blood type   Beta hcg level 4/8: 78 Beta hcg level 4/10: 160 Beta hcg level 4/23: 1282 Beta hcg level 4/25: 898   Assessment:  1. SAB (spontaneous abortion)     Plan:  Discharge home in stable condition  Follow up in [facility] in 1 week for beta hcg level; the clinic will call you Support given Bleeding precautions, return to MAU with pain or heavy vaginal bleeding    Duane LopeJennifer I Rasch, NP 9:06 AM 02/07/2015

## 2015-02-14 ENCOUNTER — Other Ambulatory Visit: Payer: Medicaid Other

## 2015-02-14 DIAGNOSIS — O039 Complete or unspecified spontaneous abortion without complication: Secondary | ICD-10-CM

## 2015-02-15 LAB — HCG, QUANTITATIVE, PREGNANCY: HCG, BETA CHAIN, QUANT, S: 1463 m[IU]/mL

## 2015-02-21 ENCOUNTER — Telehealth: Payer: Self-pay

## 2015-02-21 ENCOUNTER — Other Ambulatory Visit: Payer: Medicaid Other

## 2015-02-21 NOTE — Telephone Encounter (Signed)
Called patient and informed her of need for repeat HCG. Patient verbalized understanding and states she can come in today 02/21/15 at 0900.

## 2015-02-21 NOTE — Telephone Encounter (Signed)
-----   Message from Adam PhenixJames G Arnold, MD sent at 02/18/2015  5:02 PM EDT ----- Needs repeat HCG 02/21/15

## 2015-02-22 ENCOUNTER — Other Ambulatory Visit: Payer: Medicaid Other

## 2015-02-23 ENCOUNTER — Telehealth: Payer: Self-pay | Admitting: *Deleted

## 2015-02-23 LAB — HCG, QUANTITATIVE, PREGNANCY: HCG, BETA CHAIN, QUANT, S: 34.4 m[IU]/mL

## 2015-02-23 NOTE — Telephone Encounter (Signed)
Called Beverly Marquez per Dr. Denyce RobertStinson's message and notified her that her bhcg level has dropped suffieiently per Dr. Adrian BlackwaterStinson - actual numbers given per patient request ( 34) and notified her that he does not recommend any further testing unless no period within 8 weeks.  She states she took a pregnancy test and it was still positive yesterday. We discussed it takes sometime for pregnancy test to be negative . I advised her if no period within 8 weeks  To call clinic , or ifsevere pain or heavy bleeding before then to come to MAU. She still voices concern- I advised her to take pregnancy test in one week, if positive - can call clinic to see if we can draw bhcg. She states she will want an ultrasound if upt positive, but will call.

## 2015-02-23 NOTE — Telephone Encounter (Signed)
-----   Message from Levie HeritageJacob J Stinson, DO sent at 02/23/2015  6:29 AM EDT ----- HCG has dropped sufficiently.  No need for further testing unless does not have menses within 8 weeks.

## 2015-04-19 ENCOUNTER — Other Ambulatory Visit: Payer: Self-pay | Admitting: Certified Nurse Midwife

## 2015-06-01 IMAGING — US US OB COMP LESS 14 WK
1 series · 13 of 28 positions shown · non-contrast
Comparison: None.

CLINICAL DATA: Pregnant patient with cramping on 01/21/2015. Beta
HCG 160, beta HCG on 01/21/15 of 78.

EXAM:
OBSTETRIC <14 WK US AND TRANSVAGINAL OB US
TECHNIQUE: Both transabdominal and transvaginal ultrasound examinations were
performed for complete evaluation of the gestation as well as the
maternal uterus, adnexal regions, and pelvic cul-de-sac.
Transvaginal technique was performed to assess early pregnancy.

[Series 1: us ob comp less 14 wk · 13 of 46 slices shown]
[im 2/46]
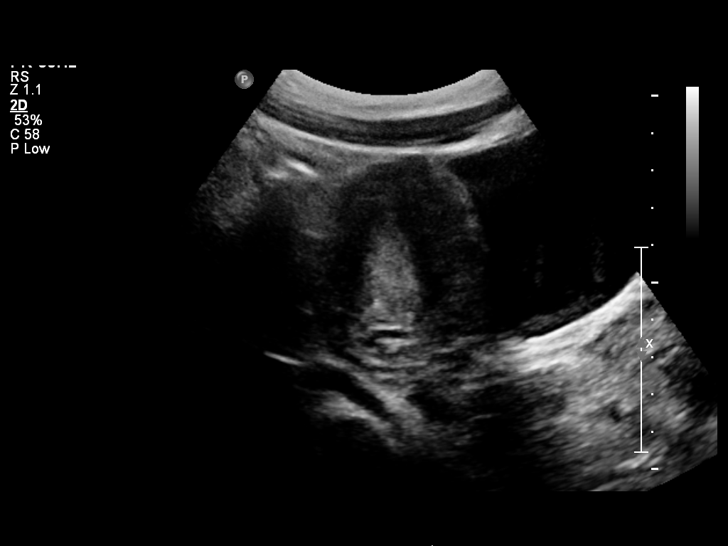
[im 6/46]
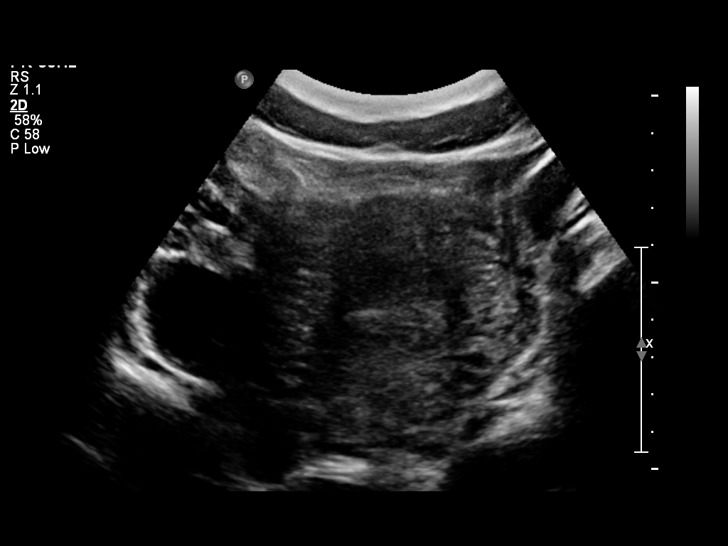
[im 9/46]
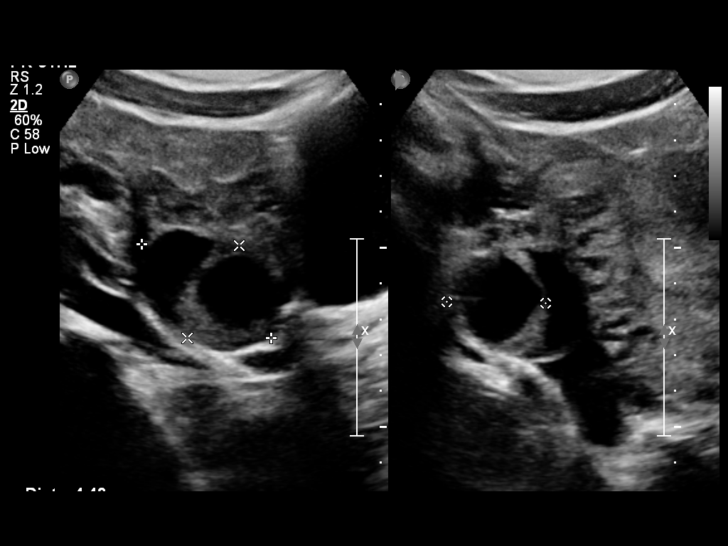
[im 12/46]
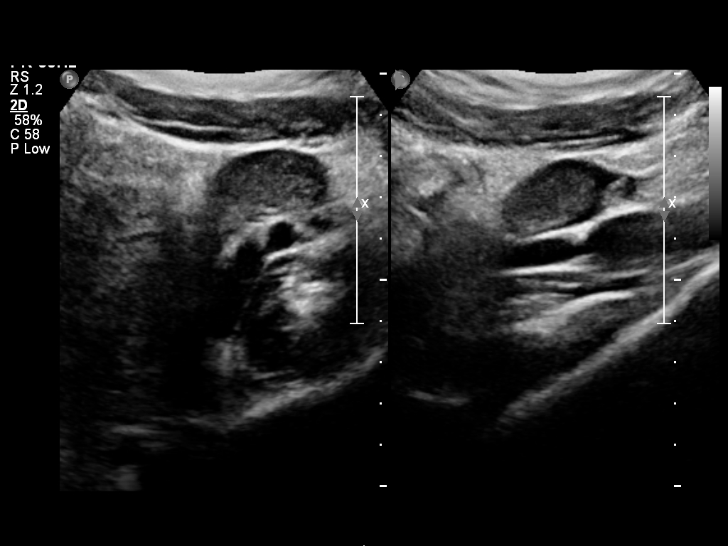
[im 16/46]
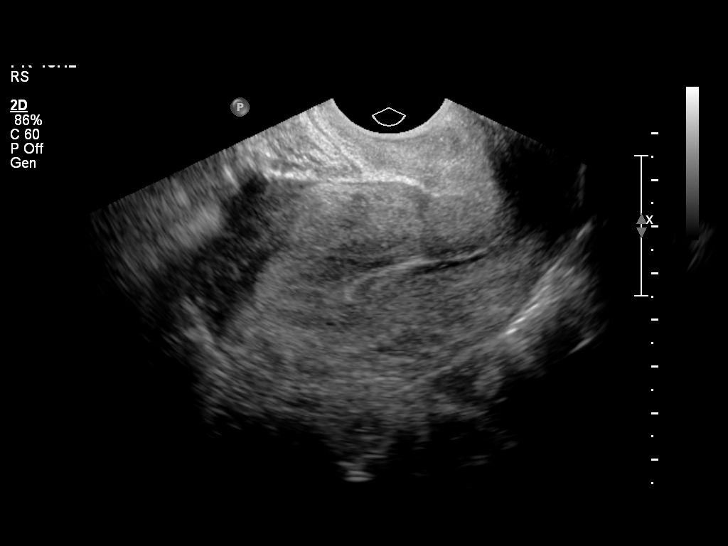
[im 19/46]
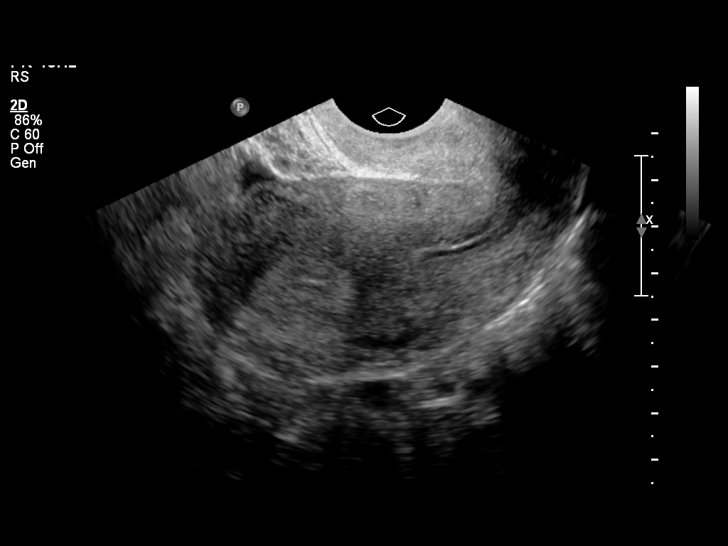
[im 24/46]
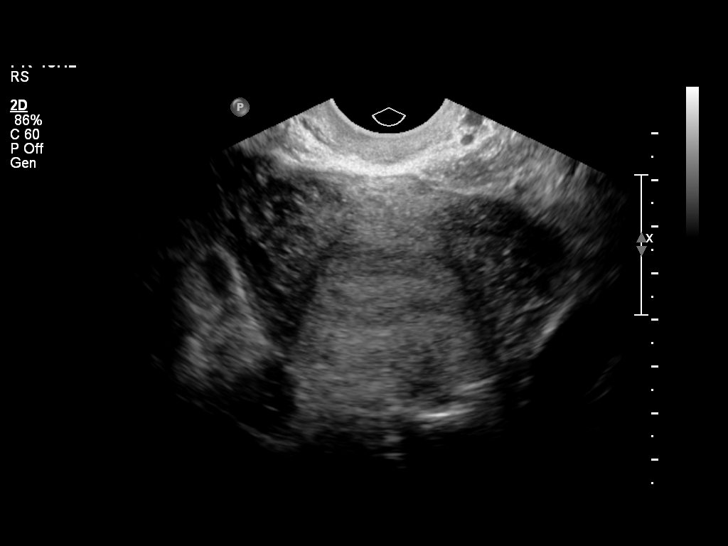
[im 27/46]
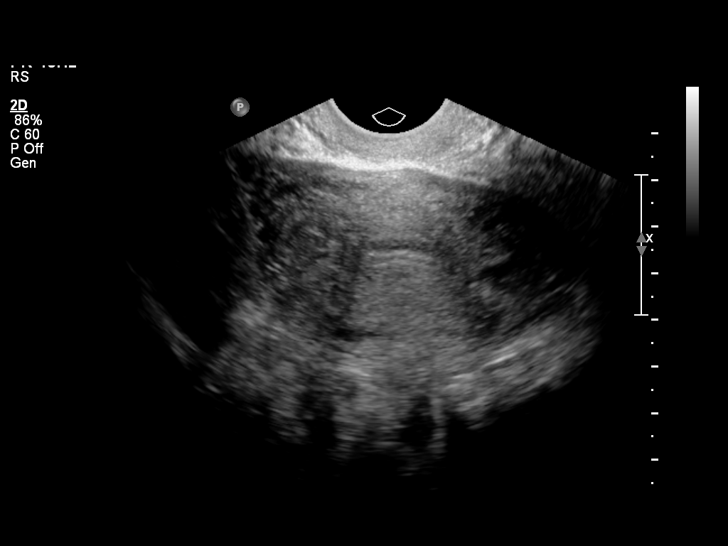
[im 31/46]
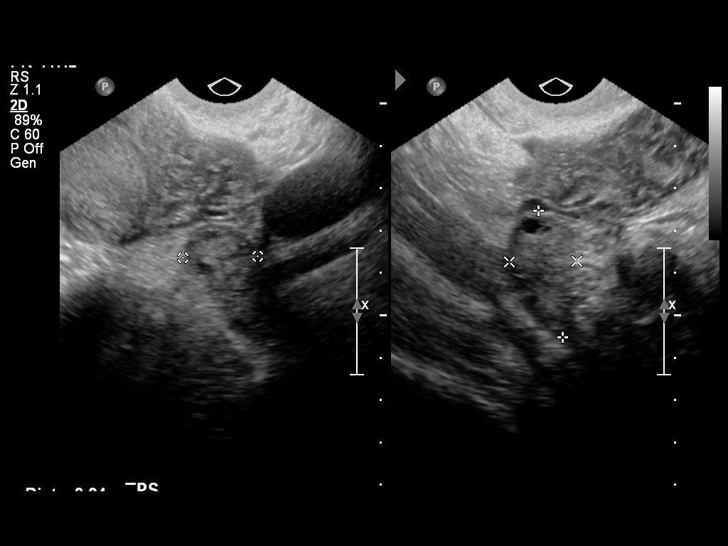
[im 34/46]
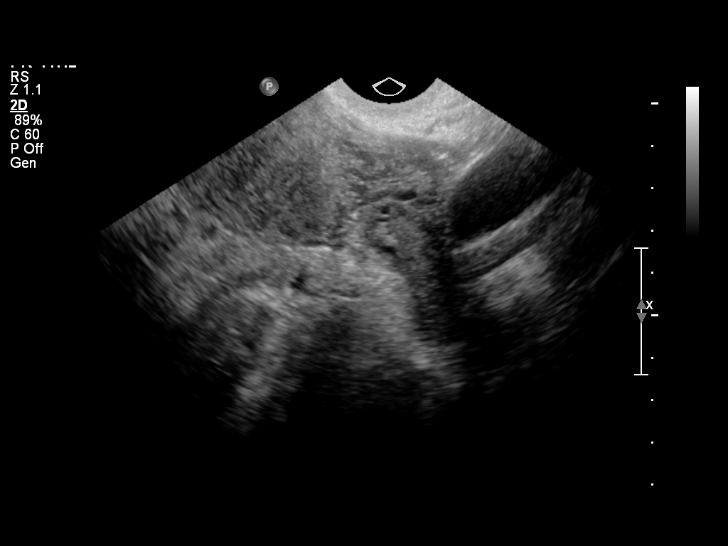
[im 37/46]
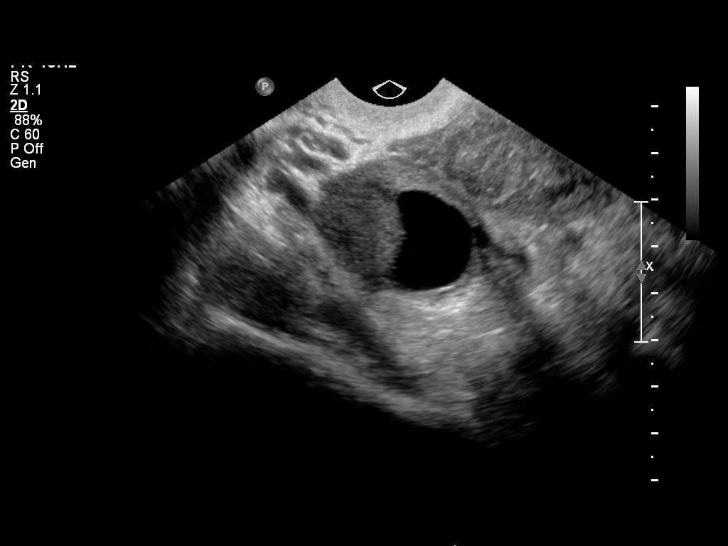
[im 41/46]
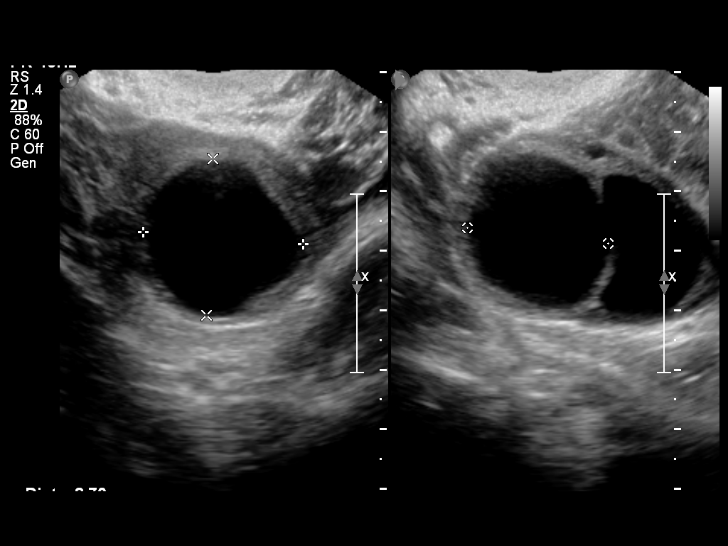
[im 44/46]
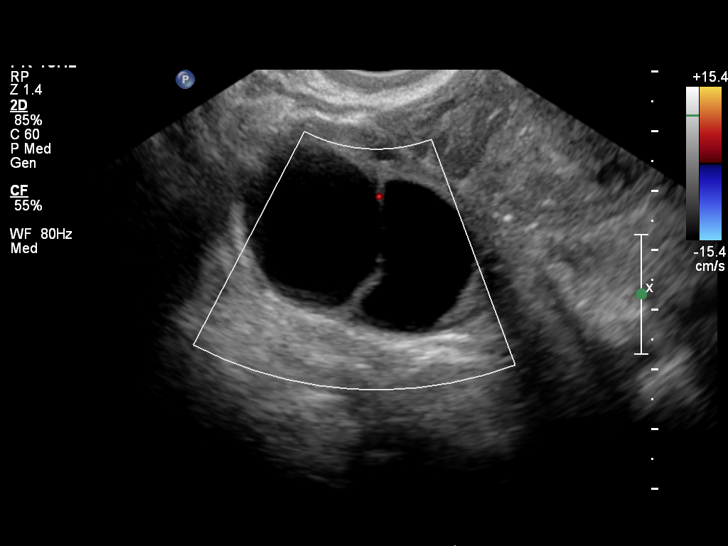

[13 of 28 positions shown; findings below may reference images not displayed]

FINDINGS: Intrauterine gestational sac: Not visualized.

Yolk sac:  Not visualized.

Embryo:  Not visualized.

Maternal uterus/adnexae: The endometrium is heterogeneous and mildly
thickened measuring 12 mm. There is no fluid in the endometrial
canal. The left ovary measures 3.0 x 1.6 x 1.8 cm and appears
normal. The right ovary measures 4.5 x 3.2 x 4.5 cm and contains 2
simple cysts, measuring 2.7 x 2.6 x 2.4 cm, and 3.2 x 2.6 x 2.3 cm.
There is blood flow to the ovarian parenchyma. Small amount of
simple free fluid in the pelvis.
IMPRESSION: 1. No intrauterine pregnancy. Mildly heterogeneous and thickened
endometrium, this may reflect very early pregnancy. Recommend
follow-up quantitative B-HCG levels and follow-up US to confirm and
assess viability.
2. Two simple cysts in the right ovary, measuring 3.2 and 2.7 cm
respectively. Normal blood flow to the ovarian parenchyma. These
could be further assessed on follow-up ultrasound.

## 2015-06-11 IMAGING — US US OB TRANSVAGINAL
1 series · 14 of 28 positions shown · non-contrast
Comparison: 01/23/2015.

CLINICAL DATA: Positive pregnancy test.  Subsequent encounter.

EXAM:
TRANSVAGINAL OB ULTRASOUND
TECHNIQUE: Transvaginal ultrasound was performed for complete evaluation of the
gestation as well as the maternal uterus, adnexal regions, and
pelvic cul-de-sac.

[Series 1: us ob transvaginal · 14 of 28 slices shown]
[im 2/28]
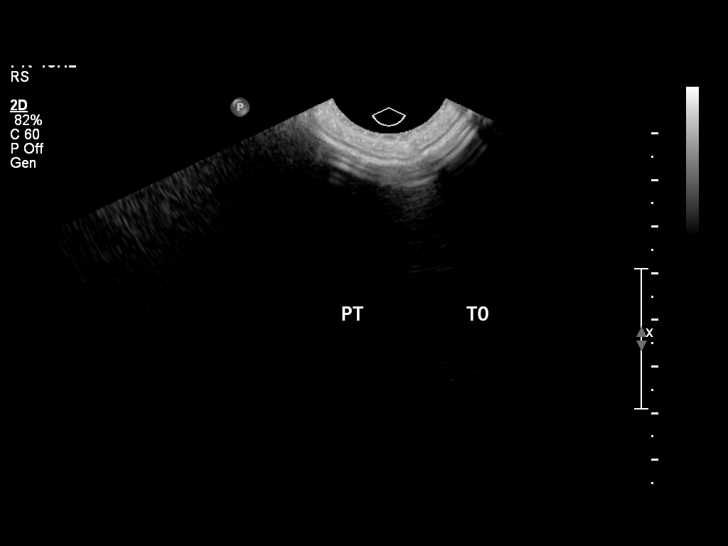
[im 4/28]
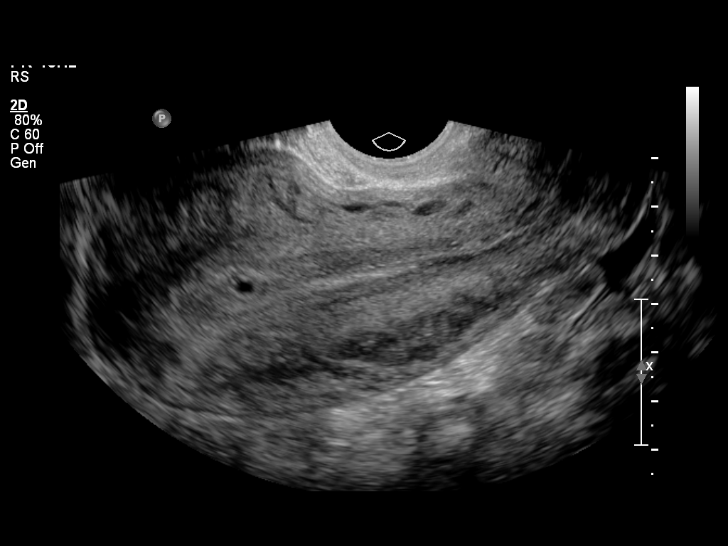
[im 6/28]
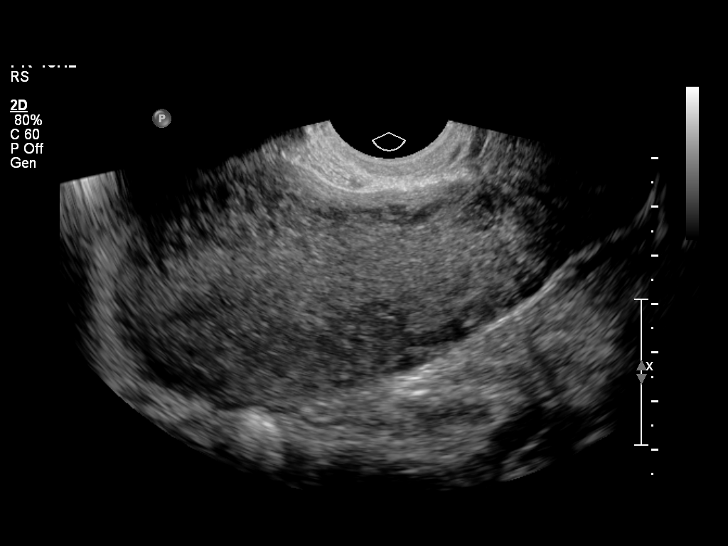
[im 8/28]
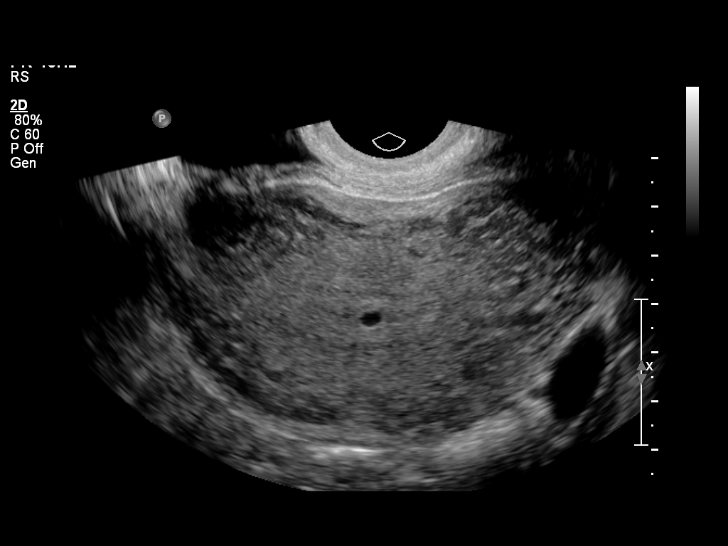
[im 10/28]
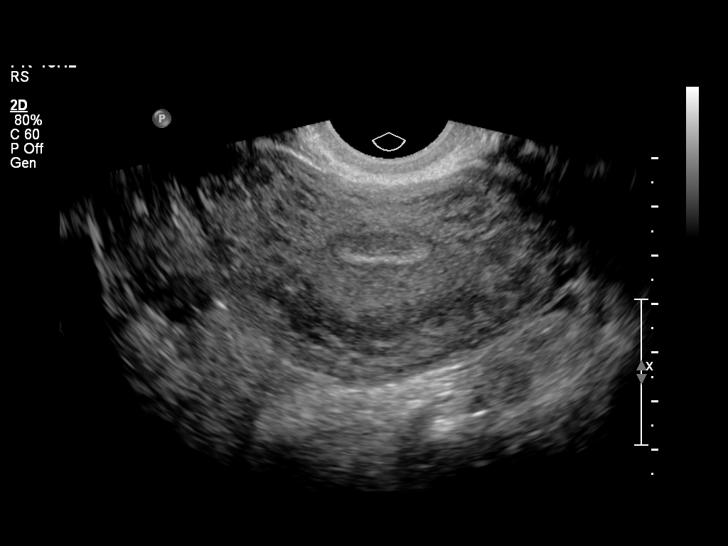
[im 12/28]
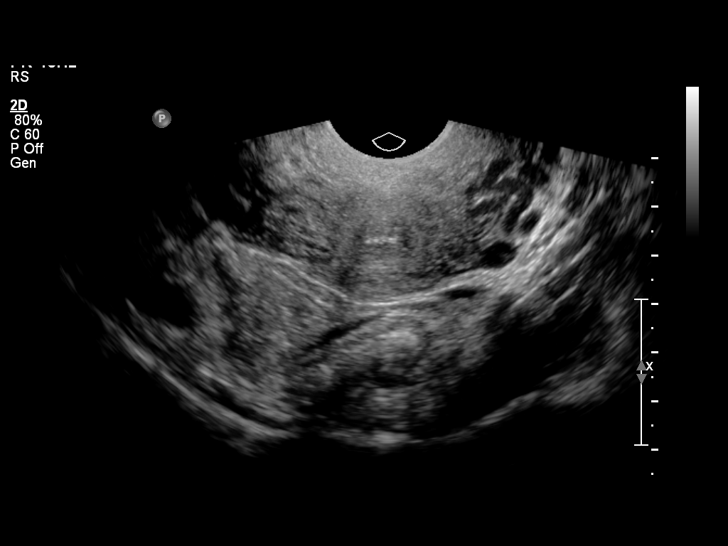
[im 14/28]
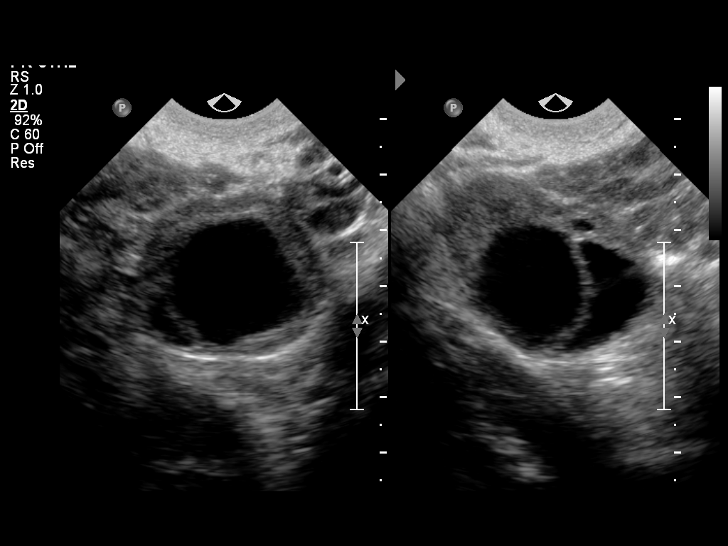
[im 16/28]
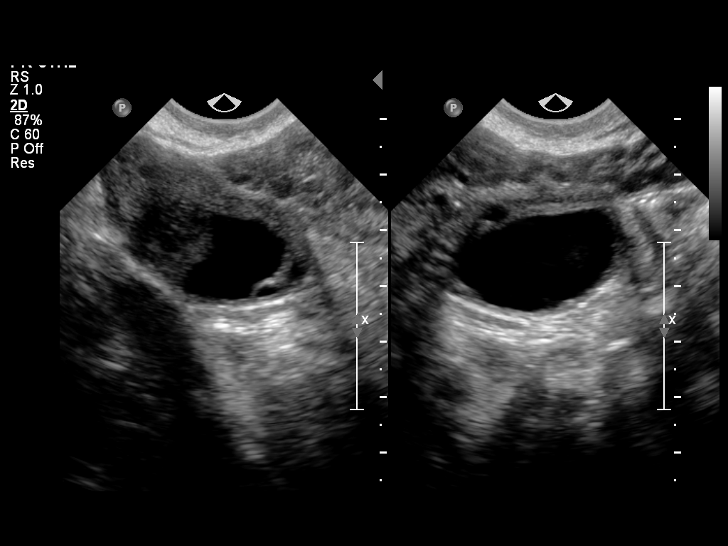
[im 18/28]
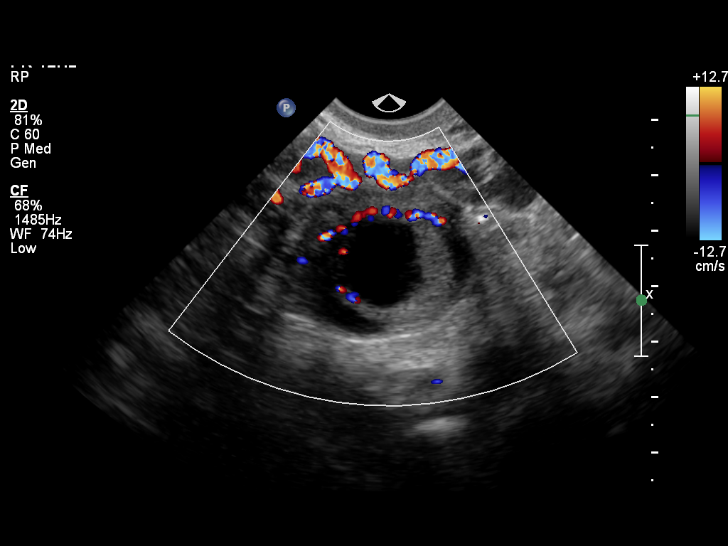
[im 20/28]
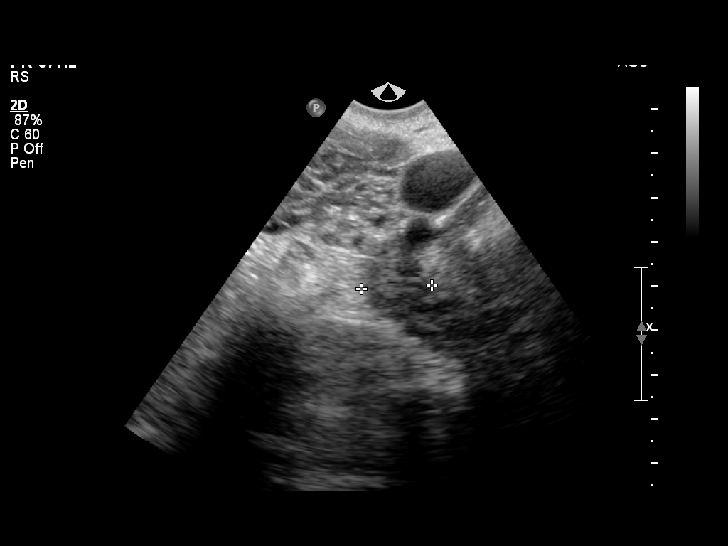
[im 22/28]
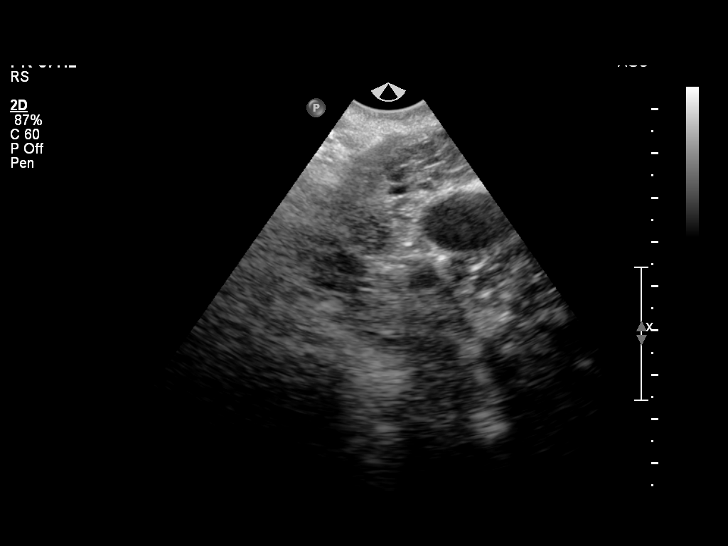
[im 24/28]
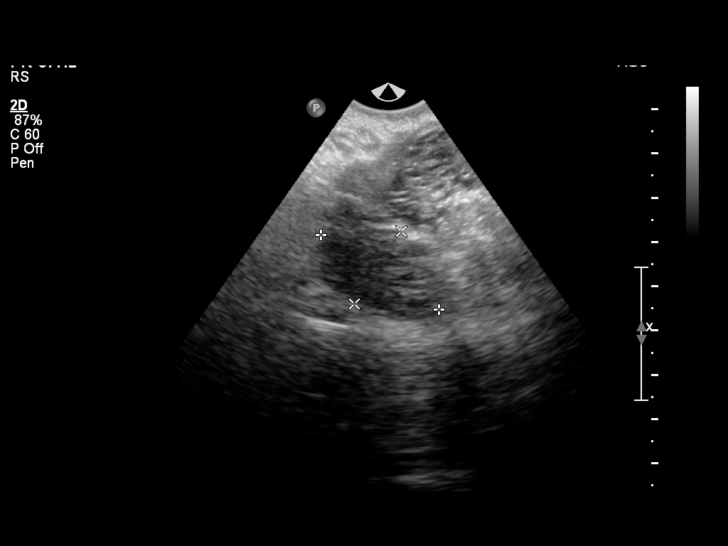
[im 26/28]
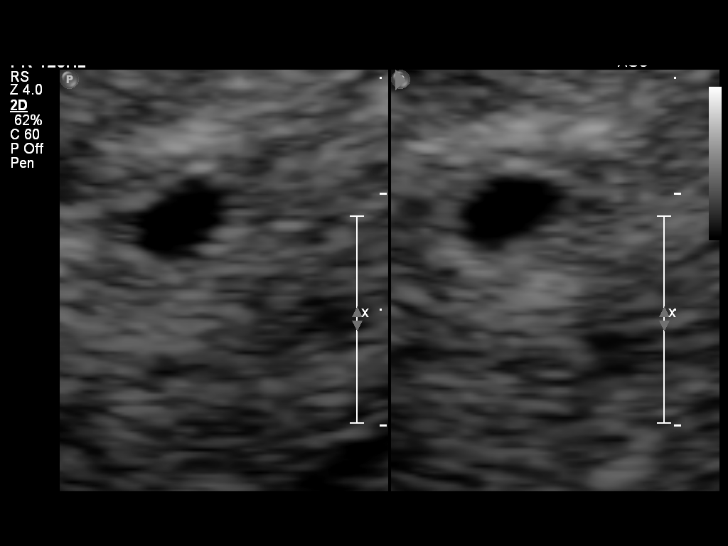
[im 28/28]
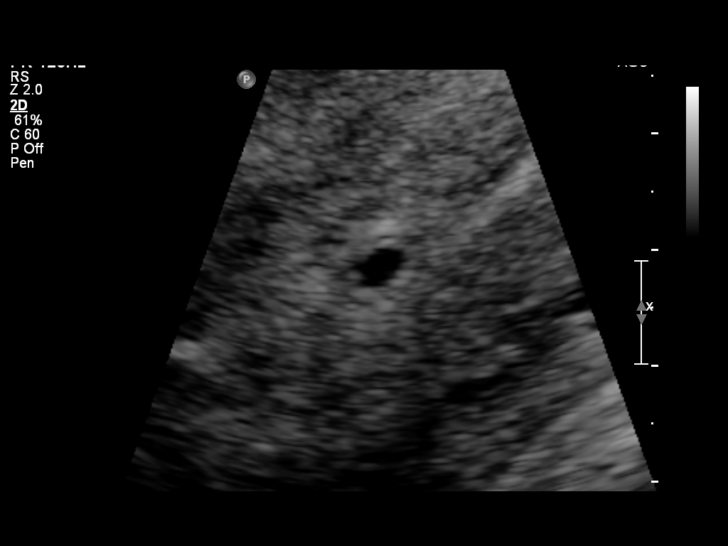

[14 of 28 positions shown; findings below may reference images not displayed]

FINDINGS: Intrauterine gestational sac: A tiny cystic structure is identified
within the decidualized endometrium of the uterine fundus. No yolk
sac or embryo is evident within the apparent gestational sac.

Yolk sac:  Not visualized

Embryo:  Not visualized

MSD: 4  mm   4 w   6  d

US EDC: 10/06/2015.

Maternal uterus/adnexae: No evidence for subchorionic hemorrhage.
3.1 cm cyst in the right ovary may represent the corpus luteum. Left
ovary is unremarkable. No substantial free fluid in the cul-de-sac.
IMPRESSION: Probable intrauterine gestational sac identified although no yolk
sac or embryo is yet evident. Close continued follow-up is
recommended to ensure appropriate pregnancy progression with serial
beta HCG and follow-up ultrasound exam as warranted.

## 2015-08-01 ENCOUNTER — Encounter (HOSPITAL_COMMUNITY): Payer: Self-pay | Admitting: *Deleted

## 2015-08-01 ENCOUNTER — Emergency Department (HOSPITAL_COMMUNITY)
Admission: EM | Admit: 2015-08-01 | Discharge: 2015-08-01 | Disposition: A | Discharge status: Home / Self Care | Payer: Medicaid Other | Attending: Emergency Medicine | Admitting: Emergency Medicine

## 2015-08-01 DIAGNOSIS — Z8659 Personal history of other mental and behavioral disorders: Secondary | ICD-10-CM | POA: Diagnosis not present

## 2015-08-01 DIAGNOSIS — B354 Tinea corporis: Secondary | ICD-10-CM | POA: Diagnosis not present

## 2015-08-01 DIAGNOSIS — Z87891 Personal history of nicotine dependence: Secondary | ICD-10-CM | POA: Diagnosis not present

## 2015-08-01 DIAGNOSIS — Z79899 Other long term (current) drug therapy: Secondary | ICD-10-CM | POA: Diagnosis not present

## 2015-08-01 DIAGNOSIS — R21 Rash and other nonspecific skin eruption: Secondary | ICD-10-CM | POA: Diagnosis present

## 2015-08-01 MED ORDER — KETOCONAZOLE 2 % EX CREA: 1.0000 "application " | TOPICAL_CREAM | Freq: Every day | CUTANEOUS | Status: DC

## 2015-08-01 NOTE — ED Provider Notes (Signed)
CSN: [id number]     Arrival date & time 08/01/15  1236 History  By signing my name below, I, Gwenyth Oberatherine Macek, attest that this documentation has been prepared under the direction and in the presence of Marlon Peliffany Kuulei Kleier, PA-C.  Electronically Signed: Gwenyth Oberatherine Macek, ED Scribe. 08/01/2015. 12:59 PM.   Chief Complaint  Patient presents with  . Rash   The history is provided by the patient. No language interpreter was used.    HPI Comments: Beverly Marquez is a 24 y.o. female who presents to the Emergency Department complaining of a constant, gradually improving, itching rash on her anterior left thigh that appeared 2 weeks ago. Pt has tried topical fungal cream ( Ketoconazole) a few times with some relief from previous fungal infection that was similar. She ran out of treatment 1 week ago. Pt denies fever.   PCP: Geraldo PitterBLAND,VEITA J, MD  ROS: The patient denies diaphoresis, fever, headache, weakness (general or focal), confusion, change of vision,  dysphagia, aphagia, shortness of breath,  abdominal pains, nausea, vomiting, diarrhea, lower extremity swelling,neck pain, chest pain  Blood pressure 113/79, pulse 96, temperature 98.1 F (36.7 C), temperature source Oral, resp. rate 18, last menstrual period 07/19/2015, SpO2 96 %, unknown if currently breastfeeding.  Past Medical History  Diagnosis Date  . Trichomonas   . No pertinent past medical history   . Bipolar 1 disorder [facility](HCC)    Past Surgical History  Procedure Laterality Date  . No past surgeries     Family History  Problem Relation Age of Onset  . Other Neg Hx    Social History  Substance Use Topics  . Smoking status: Former Smoker    Types: Cigarettes  . Smokeless tobacco: Never Used  . Alcohol Use: No   OB History    Gravida Para Term Preterm AB TAB SAB Ectopic Multiple Living   3 2 2       2      Review of Systems  Constitutional: Negative for fever.  Skin: Positive for rash.   Allergies  Review of patient's  allergies indicates no known allergies.  Home Medications   Prior to Admission medications   Medication Sig Start Date End Date Taking? Authorizing Provider  ketoconazole (NIZORAL) 2 % cream Apply 1 application topically daily. 08/01/15   Marlon Peliffany Shaneika Rossa, PA-C  Prenatal Vit-Fe Fumarate-FA (PRENATAL MULTIVITAMIN) TABS tablet Take 1 tablet by mouth daily at 12 noon.    Historical Provider, MD  terconazole (TERAZOL 7) 0.4 % vaginal cream Place 1 applicator vaginally at bedtime. Patient not taking: Reported on 02/05/2015 01/19/15   Duane LopeJennifer I Rasch, NP   BP 113/79 mmHg  Pulse 96  Temp(Src) 98.1 F (36.7 C) (Oral)  Resp 18  SpO2 96%  LMP 07/19/2015  Breastfeeding? Unknown Physical Exam  Constitutional: She appears well-developed and well-nourished. No distress.  HENT:  Head: Normocephalic and atraumatic.  Eyes: Conjunctivae and EOM are normal.  Neck: Neck supple. No tracheal deviation present.  Cardiovascular: Normal rate.   Pulmonary/Chest: Effort normal. No respiratory distress.  Skin: Skin is warm and dry.     3 small satellite lesions with central clearing, slightly raised ridges that appear excoriated and flaky  Psychiatric: She has a normal mood and affect. Her behavior is normal.  Nursing note and vitals reviewed.   ED Course  Procedures   DIAGNOSTIC STUDIES: Oxygen Saturation is 96% on RA, adequate by my interpretation.    COORDINATION OF CARE: 12:58 PM Discussed treatment plan with pt which includes a  prescription refill. Pt agreed to plan.   Labs Review Labs Reviewed - No data to display  Imaging Review No results found.   EKG Interpretation None      MDM   Final diagnoses:  Ringworm of body    ketoconazole (NIZORAL) 2 % cream Apply 1 application topically daily. 15 g Marlon Pel, PA-C   Medications - No data to display  24 y.o.Beverly Marquez's medical screening exam was performed and I feel the patient has had an appropriate workup for their  chief complaint at this time and likelihood of emergent condition existing is low. They have been counseled on decision, discharge, follow up and which symptoms necessitate immediate return to the emergency department. They or their family verbally stated understanding and agreement with plan and discharged in stable condition.   Vital signs are stable at discharge. Filed Vitals:   08/01/15 1244  BP: 113/79  Pulse: 96  Temp: 98.1 F (36.7 C)  Resp: 18     I personally performed the services described in this documentation, which was scribed in my presence. The recorded information has been reviewed and is accurate.    Marlon Pel, PA-C 08/01/15 1302  Laurence Spates, MD 08/02/15 [phone]

## 2015-08-01 NOTE — Discharge Instructions (Signed)

## 2015-08-01 NOTE — ED Notes (Signed)
Pt complains of a red, dry itching dime sized spot on her left anterior thigh for the past 2 weeks. Pt states she tried using ring worm cream for a couple of days, which she states helped.

## 2015-10-13 ENCOUNTER — Emergency Department (HOSPITAL_COMMUNITY)
Admission: EM | Admit: 2015-10-13 | Discharge: 2015-10-13 | Discharge status: Against Medical Advice | Payer: Medicaid Other | Attending: Emergency Medicine | Admitting: Emergency Medicine

## 2015-10-13 ENCOUNTER — Encounter (HOSPITAL_COMMUNITY): Payer: Self-pay | Admitting: *Deleted

## 2015-10-13 DIAGNOSIS — R51 Headache: Secondary | ICD-10-CM | POA: Diagnosis not present

## 2015-10-13 NOTE — ED Notes (Signed)
No answer when called from waiting room

## 2015-10-13 NOTE — ED Notes (Signed)
No answer from waiting room.

## 2015-10-13 NOTE — ED Notes (Signed)
Pt called for vital recheck, no answer. 

## 2015-10-13 NOTE — ED Notes (Signed)
Pt complains of intermittent headaches since Christmas. Pt states the pain is in the back of her head and behind her eyes. Pt states she is sensitive to light when the headaches happen. Pt states she took a BC powder on Christmas, which she states helped. Pt denies nausea/vomting.

## 2015-10-16 NOTE — L&D Delivery Note (Signed)
Delivery Note  Patient presented with PROM at home @ 01:00 on 10/18. SOL shortly thereafter. Per maternal request, labor was augmented with pitocin.   At 3:33 PM a viable female was delivered via  (Presentation: OA  ).  APGAR: pending ; weight pending .   Placenta status: intact.  Cord: 3 vessel. with the following complications: none.  Cord pH: not obtained   Anesthesia:  epidural Episiotomy:  none Lacerations:  none Suture Repair: n/a Est. Blood Loss (mL):  100  Mom to postpartum.  Baby to Couplet care / Skin to Skin.  Beverly Marquez D Beverly Marquez 08/01/2016, 3:47 PM

## 2015-11-27 ENCOUNTER — Inpatient Hospital Stay (HOSPITAL_COMMUNITY)
Admission: AD | Admit: 2015-11-27 | Discharge: 2015-11-27 | Disposition: A | Discharge status: Home / Self Care | Payer: Medicaid Other | Source: Ambulatory Visit | Attending: Obstetrics | Admitting: Obstetrics

## 2015-11-27 DIAGNOSIS — Z3200 Encounter for pregnancy test, result unknown: Secondary | ICD-10-CM | POA: Insufficient documentation

## 2015-11-27 NOTE — MAU Note (Signed)
Pt had pos HPT today, wants pregnancy confirmed.  Had a miscarriage with last pregnancy & is nervous.  Pt denies pain or bleeding.

## 2015-12-15 LAB — OB RESULTS CONSOLE HEPATITIS B SURFACE ANTIGEN: HEP B S AG: NEGATIVE

## 2015-12-15 LAB — OB RESULTS CONSOLE ANTIBODY SCREEN: Antibody Screen: NEGATIVE

## 2015-12-15 LAB — OB RESULTS CONSOLE ABO/RH: RH Type: POSITIVE

## 2015-12-15 LAB — OB RESULTS CONSOLE RUBELLA ANTIBODY, IGM: RUBELLA: IMMUNE

## 2015-12-15 LAB — OB RESULTS CONSOLE PLATELET COUNT: Platelets: 333 10*3/uL

## 2015-12-15 LAB — OB RESULTS CONSOLE HIV ANTIBODY (ROUTINE TESTING)
HIV: NONREACTIVE
HIV: NONREACTIVE

## 2015-12-15 LAB — OB RESULTS CONSOLE GC/CHLAMYDIA
CHLAMYDIA, DNA PROBE: NEGATIVE
Chlamydia: NEGATIVE
Gonorrhea: NEGATIVE
Gonorrhea: NEGATIVE

## 2015-12-15 LAB — OB RESULTS CONSOLE RPR
RPR: NONREACTIVE
RPR: NONREACTIVE

## 2015-12-15 LAB — SICKLE CELL SCREEN: Sickle Cell Screen: NEGATIVE

## 2015-12-15 LAB — OB RESULTS CONSOLE HGB/HCT, BLOOD
HCT: 36 %
HEMOGLOBIN: 12 g/dL

## 2016-04-05 ENCOUNTER — Encounter (HOSPITAL_COMMUNITY): Payer: Self-pay | Admitting: *Deleted

## 2016-04-05 ENCOUNTER — Inpatient Hospital Stay (HOSPITAL_COMMUNITY)
Admission: AD | Admit: 2016-04-05 | Discharge: 2016-04-05 | Disposition: A | Discharge status: Home / Self Care | Payer: Medicaid Other | Source: Ambulatory Visit | Attending: Obstetrics | Admitting: Obstetrics

## 2016-04-05 DIAGNOSIS — B373 Candidiasis of vulva and vagina: Secondary | ICD-10-CM | POA: Diagnosis not present

## 2016-04-05 DIAGNOSIS — F319 Bipolar disorder, unspecified: Secondary | ICD-10-CM | POA: Diagnosis not present

## 2016-04-05 DIAGNOSIS — Z87891 Personal history of nicotine dependence: Secondary | ICD-10-CM | POA: Diagnosis not present

## 2016-04-05 DIAGNOSIS — B3731 Acute candidiasis of vulva and vagina: Secondary | ICD-10-CM

## 2016-04-05 DIAGNOSIS — Z3A22 22 weeks gestation of pregnancy: Secondary | ICD-10-CM

## 2016-04-05 DIAGNOSIS — O99342 Other mental disorders complicating pregnancy, second trimester: Secondary | ICD-10-CM | POA: Diagnosis not present

## 2016-04-05 DIAGNOSIS — L292 Pruritus vulvae: Secondary | ICD-10-CM | POA: Diagnosis present

## 2016-04-05 DIAGNOSIS — N898 Other specified noninflammatory disorders of vagina: Secondary | ICD-10-CM | POA: Diagnosis present

## 2016-04-05 DIAGNOSIS — O98812 Other maternal infectious and parasitic diseases complicating pregnancy, second trimester: Secondary | ICD-10-CM | POA: Diagnosis not present

## 2016-04-05 LAB — URINALYSIS, ROUTINE W REFLEX MICROSCOPIC
Bilirubin Urine: NEGATIVE
GLUCOSE, UA: NEGATIVE mg/dL
Ketones, ur: NEGATIVE mg/dL
Nitrite: NEGATIVE
PROTEIN: NEGATIVE mg/dL
Specific Gravity, Urine: 1.015 (ref 1.005–1.030)
pH: 7.5 (ref 5.0–8.0)

## 2016-04-05 LAB — WET PREP, GENITAL
Clue Cells Wet Prep HPF POC: NONE SEEN
Sperm: NONE SEEN
Trich, Wet Prep: NONE SEEN

## 2016-04-05 LAB — URINE MICROSCOPIC-ADD ON: RBC / HPF: NONE SEEN RBC/hpf (ref 0–5)

## 2016-04-05 MED ORDER — TERCONAZOLE 0.4 % VA CREA
1.0000 | TOPICAL_CREAM | Freq: Every day | VAGINAL | Status: DC
Start: 2016-04-05 — End: 2016-07-30

## 2016-04-05 NOTE — MAU Note (Signed)
Wants to be checked.  Frequent yeast infections with other preg. Has one sexual partner, but wants to make sure everything is ok. Having some itching

## 2016-04-05 NOTE — MAU Provider Note (Signed)
History     CSN: [id number]  Arrival date and time: 04/05/16 [id number]   First Provider Initiated Contact with Patient 04/05/16 [phone]      Chief Complaint  Patient presents with  . Vaginal Discharge  . Vaginal Itching   HPI   Beverly Marquez is a 25 y.o. female [phone] @ 2039w3d here for STI checkup.  She has been experiencing vaginal itching times 2-3 days, and wants to be sure it is not an STI.   Denies vaginal bleeding.  + fetal movement   OB History    Gravida Para Term Preterm AB TAB SAB Ectopic Multiple Living   4 2 2  1  1   2       Past Medical History  Diagnosis Date  . Trichomonas   . No pertinent past medical history   . Bipolar 1 disorder [facility](HCC)     Past Surgical History  Procedure Laterality Date  . No past surgeries      Family History  Problem Relation Age of Onset  . Other Neg Hx     Social History  Substance Use Topics  . Smoking status: Former Smoker    Types: Cigarettes  . Smokeless tobacco: Never Used  . Alcohol Use: No    Allergies: No Known Allergies  Prescriptions prior to admission  Medication Sig Dispense Refill Last Dose  . ketoconazole (NIZORAL) 2 % cream Apply 1 application topically daily. 15 g 0   . Prenatal Vit-Fe Fumarate-FA (PRENATAL MULTIVITAMIN) TABS tablet Take 1 tablet by mouth daily at 12 noon.   02/04/2015 at Unknown time  . terconazole (TERAZOL 7) 0.4 % vaginal cream Place 1 applicator vaginally at bedtime. (Patient not taking: Reported on 02/05/2015) 45 g 0 01/20/2015 at Unknown time   Results for orders placed or performed during the hospital encounter of 04/05/16 (from the past 48 hour(s))  Urinalysis, Routine w reflex microscopic ([facility])     Status: Abnormal   Collection Time: 04/05/16  9:05 AM  Result Value Ref Range   Color, Urine YELLOW YELLOW   APPearance HAZY (A) CLEAR   Specific Gravity, Urine 1.015 1.005 - 1.030   pH 7.5 5.0 - 8.0   Glucose, UA NEGATIVE NEGATIVE mg/dL   Hgb urine dipstick TRACE (A)  NEGATIVE   Bilirubin Urine NEGATIVE NEGATIVE   Ketones, ur NEGATIVE NEGATIVE mg/dL   Protein, ur NEGATIVE NEGATIVE mg/dL   Nitrite NEGATIVE NEGATIVE   Leukocytes, UA LARGE (A) NEGATIVE  Urine microscopic-add on     Status: Abnormal   Collection Time: 04/05/16  9:05 AM  Result Value Ref Range   Squamous Epithelial / LPF 0-5 (A) NONE SEEN   WBC, UA 6-30 0 - 5 WBC/hpf   RBC / HPF NONE SEEN 0 - 5 RBC/hpf   Bacteria, UA FEW (A) NONE SEEN  Wet prep, genital     Status: Abnormal   Collection Time: 04/05/16  9:37 AM  Result Value Ref Range   Yeast Wet Prep HPF POC PRESENT (A) NONE SEEN   Trich, Wet Prep NONE SEEN NONE SEEN   Clue Cells Wet Prep HPF POC NONE SEEN NONE SEEN   WBC, Wet Prep HPF POC MANY (A) NONE SEEN    Comment: MANY BACTERIA SEEN   Sperm NONE SEEN     Review of Systems  Constitutional: Negative for fever and chills.  Gastrointestinal: Negative for abdominal pain.   Physical Exam   Blood pressure 121/62, pulse 93, temperature 98.5 F (36.9  C), temperature source Oral, resp. rate 18, weight 165 lb 9.6 oz (75.116 kg), last menstrual period 10/31/2015, unknown if currently breastfeeding.  Physical Exam  Constitutional: She is oriented to person, place, and time. She appears well-developed and well-nourished. No distress.  HENT:  Head: Normocephalic.  Eyes: Pupils are equal, round, and reactive to light.  Respiratory: Effort normal.  Genitourinary:  Speculum exam: Vagina - Small amount of creamy, pale yellow discharge, no odor. Thick, particulate discharge noted.  Cervix - No contact bleeding Bimanual exam: Cervix closed, anterior  GC/Chlam, wet prep done Chaperone present for exam.  Musculoskeletal: Normal range of motion.  Neurological: She is alert and oriented to person, place, and time.  Skin: Skin is warm. She is not diaphoretic.  Psychiatric: Her behavior is normal.    MAU Course  Procedures  None  MDM  + fetal heart tones via doppler   Assessment  and Plan   A:  1. Yeast vaginitis     P:  Discharge home in stable condition Rx: Terazol cream  GC pending Return to MAU for emergencies Follow up with Dr. Clearance CootsHarper as directed. Condoms always    Duane LopeJennifer I Rasch, NP 04/05/2016 3:07 PM

## 2016-04-05 NOTE — Discharge Instructions (Signed)
 Vaginitis Vaginitis is an inflammation of the vagina. It is most often caused by a change in the normal balance of the bacteria and yeast that live in the vagina. This change in balance causes an overgrowth of certain bacteria or yeast, which causes the inflammation. There are different types of vaginitis, but the most common types are:  Bacterial vaginosis.  Yeast infection (candidiasis).  Trichomoniasis vaginitis. This is a sexually transmitted infection (STI).  Viral vaginitis.  Atrophic vaginitis.  Allergic vaginitis. CAUSES  The cause depends on the type of vaginitis. Vaginitis can be caused by:  Bacteria (bacterial vaginosis).  Yeast (yeast infection).  A parasite (trichomoniasis vaginitis)  A virus (viral vaginitis).  Low hormone levels (atrophic vaginitis). Low hormone levels can occur during pregnancy, breastfeeding, or after menopause.  Irritants, such as bubble baths, scented tampons, and feminine sprays (allergic vaginitis). Other factors can change the normal balance of the yeast and bacteria that live in the vagina. These include:  Antibiotic medicines.  Poor hygiene.  Diaphragms, vaginal sponges, spermicides, birth control pills, and intrauterine devices (IUD).  Sexual intercourse.  Infection.  Uncontrolled diabetes.  A weakened immune system. SYMPTOMS  Symptoms can vary depending on the cause of the vaginitis. Common symptoms include:  Abnormal vaginal discharge.  The discharge is white, gray, or yellow with bacterial vaginosis.  The discharge is thick, white, and cheesy with a yeast infection.  The discharge is frothy and yellow or greenish with trichomoniasis.  A bad vaginal odor.  The odor is fishy with bacterial vaginosis.  Vaginal itching, pain, or swelling.  Painful intercourse.  Pain or burning when urinating. Sometimes, there are no symptoms. TREATMENT  Treatment will vary depending on the type of infection.   Bacterial  vaginosis and trichomoniasis are often treated with antibiotic creams or pills.  Yeast infections are often treated with antifungal medicines, such as vaginal creams or suppositories.  Viral vaginitis has no cure, but symptoms can be treated with medicines that relieve discomfort. Your sexual partner should be treated as well.  Atrophic vaginitis may be treated with an estrogen cream, pill, suppository, or vaginal ring. If vaginal dryness occurs, lubricants and moisturizing creams may help. You may be told to avoid scented soaps, sprays, or douches.  Allergic vaginitis treatment involves quitting the use of the product that is causing the problem. Vaginal creams can be used to treat the symptoms. HOME CARE INSTRUCTIONS   Take all medicines as directed by your caregiver.  Keep your genital area clean and dry. Avoid soap and only rinse the area with water.  Avoid douching. It can remove the healthy bacteria in the vagina.  Do not use tampons or have sexual intercourse until your vaginitis has been treated. Use sanitary pads while you have vaginitis.  Wipe from front to back. This avoids the spread of bacteria from the rectum to the vagina.  Let air reach your genital area.  Wear cotton underwear to decrease moisture buildup.  Avoid wearing underwear while you sleep until your vaginitis is gone.  Avoid tight pants and underwear or nylons without a cotton panel.  Take off wet clothing (especially bathing suits) as soon as possible.  Use mild, non-scented products. Avoid using irritants, such as:  Scented feminine sprays.  Fabric softeners.  Scented detergents.  Scented tampons.  Scented soaps or bubble baths.  Practice safe sex and use condoms. Condoms may prevent the spread of trichomoniasis and viral vaginitis. SEEK MEDICAL CARE IF:   You have abdominal pain.    You have a fever or persistent symptoms for more than 2-3 days.  You have a fever and your symptoms suddenly  get worse.   This information is not intended to replace advice given to you by your health care provider. Make sure you discuss any questions you have with your health care provider.   Document Released: 07/29/2007 Document Revised: 02/15/2015 Document Reviewed: 03/13/2012 Elsevier Interactive Patient Education 2016 Elsevier Inc. Monilial Vaginitis Vaginitis in a soreness, swelling and redness (inflammation) of the vagina and vulva. Monilial vaginitis is not a sexually transmitted infection. CAUSES  Yeast vaginitis is caused by yeast (candida) that is normally found in your vagina. With a yeast infection, the candida has overgrown in number to a point that upsets the chemical balance. SYMPTOMS   White, thick vaginal discharge.  Swelling, itching, redness and irritation of the vagina and possibly the lips of the vagina (vulva).  Burning or painful urination.  Painful intercourse. DIAGNOSIS  Things that may contribute to monilial vaginitis are:  Postmenopausal and virginal states.  Pregnancy.  Infections.  Being tired, sick or stressed, especially if you had monilial vaginitis in the past.  Diabetes. Good control will help lower the chance.  Birth control pills.  Tight fitting garments.  Using bubble bath, feminine sprays, douches or deodorant tampons.  Taking certain medications that kill germs (antibiotics).  Sporadic recurrence can occur if you become ill. TREATMENT  Your caregiver will give you medication.  There are several kinds of anti monilial vaginal creams and suppositories specific for monilial vaginitis. For recurrent yeast infections, use a suppository or cream in the vagina 2 times a week, or as directed.  Anti-monilial or steroid cream for the itching or irritation of the vulva may also be used. Get your caregiver's permission.  Painting the vagina with methylene blue solution may help if the monilial cream does not work.  Eating yogurt may help  prevent monilial vaginitis. HOME CARE INSTRUCTIONS   Finish all medication as prescribed.  Do not have sex until treatment is completed or after your caregiver tells you it is okay.  Take warm sitz baths.  Do not douche.  Do not use tampons, especially scented ones.  Wear cotton underwear.  Avoid tight pants and panty hose.  Tell your sexual partner that you have a yeast infection. They should go to their caregiver if they have symptoms such as mild rash or itching.  Your sexual partner should be treated as well if your infection is difficult to eliminate.  Practice safer sex. Use condoms.  Some vaginal medications cause latex condoms to fail. Vaginal medications that harm condoms are:  Cleocin cream.  Butoconazole (Femstat).  Terconazole (Terazol) vaginal suppository.  Miconazole (Monistat) (may be purchased over the counter). SEEK MEDICAL CARE IF:   You have a temperature by mouth above 102 F (38.9 C).  The infection is getting worse after 2 days of treatment.  The infection is not getting better after 3 days of treatment.  You develop blisters in or around your vagina.  You develop vaginal bleeding, and it is not your menstrual period.  You have pain when you urinate.  You develop intestinal problems.  You have pain with sexual intercourse.   This information is not intended to replace advice given to you by your health care provider. Make sure you discuss any questions you have with your health care provider.   Document Released: 07/11/2005 Document Revised: 12/24/2011 Document Reviewed: 04/04/2015 Elsevier Interactive Patient Education 2016 Elsevier Inc.  

## 2016-04-06 LAB — GC/CHLAMYDIA PROBE AMP (~~LOC~~) NOT AT ARMC
Chlamydia: NEGATIVE
NEISSERIA GONORRHEA: NEGATIVE

## 2016-05-02 ENCOUNTER — Ambulatory Visit (INDEPENDENT_AMBULATORY_CARE_PROVIDER_SITE_OTHER): Payer: Medicaid Other | Admitting: Obstetrics

## 2016-05-02 ENCOUNTER — Encounter: Payer: Self-pay | Admitting: Obstetrics

## 2016-05-02 VITALS — BP 124/66 | HR 91 | Temp 97.9°F | Wt 168.0 lb

## 2016-05-02 DIAGNOSIS — Z3493 Encounter for supervision of normal pregnancy, unspecified, third trimester: Secondary | ICD-10-CM

## 2016-05-02 NOTE — Progress Notes (Signed)
Subjective:    Beverly Marquez is a 25 y.o. female being seen today for her obstetrical visit. She is at 3586w2d gestation. Patient reports: no complaints . Fetal movement: normal.  Problem List Items Addressed This Visit    None     Patient Active Problem List   Diagnosis Date Noted  . Normal delivery 11/15/2011   Objective:    BP 124/66 mmHg  Pulse 91  Temp(Src) 97.9 F (36.6 C)  Wt 168 lb (76.204 kg)  LMP 10/31/2015 FHT: 150 BPM  Uterine Size: size equals dates     Assessment:    Pregnancy @ 8086w2d    Plan:    OBGCT: ordered for next visit. Signs and symptoms of preterm labor: discussed.  Labs, problem list reviewed and updated 2 hr GTT planned Follow up in 2 weeks.

## 2016-05-16 ENCOUNTER — Other Ambulatory Visit: Payer: Medicaid Other

## 2016-05-16 ENCOUNTER — Ambulatory Visit (INDEPENDENT_AMBULATORY_CARE_PROVIDER_SITE_OTHER): Payer: Medicaid Other | Admitting: Obstetrics

## 2016-05-16 ENCOUNTER — Encounter: Payer: Self-pay | Admitting: Obstetrics

## 2016-05-16 VITALS — BP 111/66 | HR 77 | Wt 172.0 lb

## 2016-05-16 DIAGNOSIS — Z3493 Encounter for supervision of normal pregnancy, unspecified, third trimester: Secondary | ICD-10-CM

## 2016-05-16 LAB — POCT URINALYSIS DIPSTICK
BILIRUBIN UA: NEGATIVE
Blood, UA: 250
Glucose, UA: NEGATIVE
Ketones, UA: NEGATIVE
NITRITE UA: NEGATIVE
Spec Grav, UA: <=1.005
Urobilinogen, UA: 0.2
pH, UA: 7

## 2016-05-16 NOTE — Progress Notes (Signed)
Patient ID: Beverly Marquez, female   DOB: [birth year], 25 y.o.   MRN: [id number] Subjective:    Beverly Marquez is a 25 y.o. female being seen today for her obstetrical visit. She is at [redacted]w[redacted]d gestation. Patient reports no complaints. Fetal movement: normal.  Problem List Items Addressed This Visit    None    Visit Diagnoses    Prenatal care in third trimester    -  Primary     Patient Active Problem List   Diagnosis Date Noted  . Normal delivery 11/15/2011   Objective:    BP 111/66   Pulse 77   Wt 172 lb (78 kg)   LMP 10/31/2015   BMI 29.52 kg/m  FHT:  150 BPM  Uterine Size: size equals dates  Presentation: unsure     Assessment:    Pregnancy @ [redacted]w[redacted]d weeks   Doing well.  Plan:     labs reviewed, problem list updated Consent signed. GBS sent TDAP offered  Rhogam given for RH negative Pediatrician: discussed. Infant feeding: plans to breastfeed. Maternity leave: discussed.  No orders of the defined types were placed in this encounter.  No orders of the defined types were placed in this encounter.  Follow up in 2 Weeks.

## 2016-05-16 NOTE — Progress Notes (Signed)
Doing well 

## 2016-05-16 NOTE — Addendum Note (Signed)
Addended by: Lear Ng on: 05/16/2016 12:16 PM   Modules accepted: Orders

## 2016-05-17 LAB — CBC
HEMATOCRIT: 28.8 % — AB (ref 34.0–46.6)
HEMOGLOBIN: 9.4 g/dL — AB (ref 11.1–15.9)
MCH: 33 pg (ref 26.6–33.0)
MCHC: 32.6 g/dL (ref 31.5–35.7)
MCV: 101 fL — ABNORMAL HIGH (ref 79–97)
Platelets: 305 10*3/uL (ref 150–379)
RBC: 2.85 x10E6/uL — ABNORMAL LOW (ref 3.77–5.28)
RDW: 13.5 % (ref 12.3–15.4)
WBC: 9 10*3/uL (ref 3.4–10.8)

## 2016-05-17 LAB — GLUCOSE TOLERANCE, 2 HOURS W/ 1HR
Glucose, 1 hour: 96 mg/dL (ref 65–179)
Glucose, 2 hour: 83 mg/dL (ref 65–152)
Glucose, Fasting: 72 mg/dL (ref 65–91)

## 2016-05-17 LAB — RPR: RPR Ser Ql: NONREACTIVE

## 2016-05-17 LAB — HIV ANTIBODY (ROUTINE TESTING W REFLEX): HIV Screen 4th Generation wRfx: NONREACTIVE

## 2016-05-21 ENCOUNTER — Other Ambulatory Visit: Payer: Self-pay | Admitting: Certified Nurse Midwife

## 2016-05-21 DIAGNOSIS — O99013 Anemia complicating pregnancy, third trimester: Secondary | ICD-10-CM

## 2016-05-21 MED ORDER — IRON POLYSACCH CMPLX-B12-FA 150-0.025-1 MG PO CAPS: 1.0000 | ORAL_CAPSULE | Freq: Every day | ORAL | 4 refills | Status: DC

## 2016-05-22 ENCOUNTER — Telehealth: Payer: Self-pay

## 2016-05-22 NOTE — Telephone Encounter (Signed)
Left message for patient to return call to office.  Renu Asby RNBSN 

## 2016-05-22 NOTE — Telephone Encounter (Signed)
-----   Message from Roe Coombsachelle A Denney, CNM sent at 05/21/2016 10:03 PM EDT ----- Please let her know that she is anemic.  Iron/Niferex has been sent to the pharmacy for her to use and take on a daily basis along with her PNV.  Thank you.  R.Denney CNM

## 2016-05-23 ENCOUNTER — Telehealth: Payer: Self-pay | Admitting: *Deleted

## 2016-05-23 NOTE — Telephone Encounter (Signed)
Patient made aware of GTT results.

## 2016-05-28 NOTE — Telephone Encounter (Signed)
Patient notified

## 2016-05-30 ENCOUNTER — Ambulatory Visit (INDEPENDENT_AMBULATORY_CARE_PROVIDER_SITE_OTHER): Payer: Medicaid Other | Admitting: Obstetrics

## 2016-05-30 ENCOUNTER — Encounter: Payer: Self-pay | Admitting: Obstetrics

## 2016-05-30 VITALS — BP 118/65 | HR 92 | Wt 173.0 lb

## 2016-05-30 DIAGNOSIS — Z3493 Encounter for supervision of normal pregnancy, unspecified, third trimester: Secondary | ICD-10-CM

## 2016-05-30 LAB — POCT URINALYSIS DIPSTICK
BILIRUBIN UA: NEGATIVE
GLUCOSE UA: NEGATIVE
KETONES UA: NEGATIVE
Leukocytes, UA: NEGATIVE
NITRITE UA: NEGATIVE
Protein, UA: NEGATIVE
RBC UA: NEGATIVE
Spec Grav, UA: 1.01
Urobilinogen, UA: NEGATIVE
pH, UA: 7

## 2016-05-30 NOTE — Progress Notes (Signed)
Patient ID: Crissie FiguresCornelia S Loren, female   DOB: 02/20/1991, 25 y.o.   MRN: [id number] Subjective:    Gerda Mort SawyersS Szeliga is a 25 y.o. female being seen today for her obstetrical visit. She is at 4474w2d gestation. Patient reports no complaints. Fetal movement: normal.  Problem List Items Addressed This Visit    None    Visit Diagnoses   None.    Patient Active Problem List   Diagnosis Date Noted  . Normal delivery 11/15/2011   Objective:    BP 118/65   Pulse 92   Wt 173 lb (78.5 kg)   LMP 10/31/2015   BMI 29.70 kg/m  FHT:  150 BPM  Uterine Size: size equals dates  Presentation: cephalic     Assessment:    Pregnancy @ 8374w2d weeks   Plan:     labs reviewed, problem list updated Consent signed. GBS sent TDAP offered  Rhogam given for RH negative Pediatrician: discussed. Infant feeding: plans to breastfeed. Maternity leave: discussed. Cigarette smoking: former smoker No orders of the defined types were placed in this encounter.  No orders of the defined types were placed in this encounter.  Follow up in 2 Weeks.

## 2016-05-30 NOTE — Addendum Note (Signed)
Addended by: Marya LandryFOSTER, Marabeth Melland D on: 05/30/2016 02:40 PM   Modules accepted: Orders

## 2016-06-06 ENCOUNTER — Telehealth: Payer: Self-pay | Admitting: *Deleted

## 2016-06-06 NOTE — Telephone Encounter (Signed)
Returned call patient wants to come out of work she is 31 weeks 2days.

## 2016-06-13 ENCOUNTER — Ambulatory Visit (INDEPENDENT_AMBULATORY_CARE_PROVIDER_SITE_OTHER): Payer: Medicaid Other | Admitting: Obstetrics

## 2016-06-13 ENCOUNTER — Encounter: Payer: Self-pay | Admitting: Obstetrics

## 2016-06-13 VITALS — BP 127/71 | HR 101 | Wt 170.0 lb

## 2016-06-13 DIAGNOSIS — Z3493 Encounter for supervision of normal pregnancy, unspecified, third trimester: Secondary | ICD-10-CM

## 2016-06-13 DIAGNOSIS — K5900 Constipation, unspecified: Secondary | ICD-10-CM

## 2016-06-13 LAB — POCT URINALYSIS DIPSTICK
Bilirubin, UA: NEGATIVE
Blood, UA: NEGATIVE
Glucose, UA: NEGATIVE
Ketones, UA: NEGATIVE
Leukocytes, UA: NEGATIVE
Nitrite, UA: NEGATIVE
PH UA: 7.5
PROTEIN UA: NEGATIVE
UROBILINOGEN UA: NEGATIVE

## 2016-06-13 MED ORDER — DOCUSATE SODIUM 100 MG PO CAPS: 100.0000 mg | ORAL_CAPSULE | Freq: Two times a day (BID) | ORAL | 5 refills | Status: DC

## 2016-06-13 NOTE — Addendum Note (Signed)
Addended by: Coral CeoHARPER, Sianne Tejada A on: 06/13/2016 02:14 PM   Modules accepted: Orders

## 2016-06-13 NOTE — Addendum Note (Signed)
Addended by: Marya LandryFOSTER, SUZANNE D on: 06/13/2016 04:47 PM   Modules accepted: Orders

## 2016-06-13 NOTE — Progress Notes (Signed)
Patient ID: Beverly Marquez, female   DOB: 03/19/1991, 25 y.o.   MRN: [id number] Subjective:    Beverly Marquez is a 25 y.o. female being seen today for her obstetrical visit. She is at 5644w2d gestation. Patient reports constipation. Fetal movement: normal.  Problem List Items Addressed This Visit    None    Visit Diagnoses   None.    Patient Active Problem List   Diagnosis Date Noted  . Normal delivery 11/15/2011   Objective:    BP 127/71   Pulse (!) 101   Wt 170 lb (77.1 kg)   LMP 10/31/2015   BMI 29.18 kg/m  FHT:  150 BPM  Uterine Size: size equals dates  Presentation: unsure     Assessment:    Pregnancy @ 5744w2d weeks   Plan:     labs reviewed, problem list updated Consent signed. GBS sent TDAP offered  Rhogam given for RH negative Pediatrician: discussed. Infant feeding: plans to breastfeed. Maternity leave: discussed. Cigarette smoking: former smoker. No orders of the defined types were placed in this encounter.  No orders of the defined types were placed in this encounter.  Follow up in 2 Weeks.

## 2016-06-27 ENCOUNTER — Ambulatory Visit (INDEPENDENT_AMBULATORY_CARE_PROVIDER_SITE_OTHER): Payer: Medicaid Other | Admitting: Obstetrics

## 2016-06-27 VITALS — BP 106/65 | HR 80 | Wt 173.0 lb

## 2016-06-27 DIAGNOSIS — Z3493 Encounter for supervision of normal pregnancy, unspecified, third trimester: Secondary | ICD-10-CM

## 2016-06-28 ENCOUNTER — Encounter: Payer: Self-pay | Admitting: *Deleted

## 2016-06-28 ENCOUNTER — Encounter: Payer: Self-pay | Admitting: Obstetrics

## 2016-06-28 NOTE — Progress Notes (Signed)
Subjective:    Todd S Romeo AppleHarrison is a 25 y.o. female being seen today for her obstetrical visit. She is at 767w3d gestation. Patient reports no complaints. Fetal movement: normal.  Problem List Items Addressed This Visit    None    Visit Diagnoses   None.    Patient Active Problem List   Diagnosis Date Noted  . Normal delivery 11/15/2011   Objective:    BP 106/65   Pulse 80   Wt 173 lb (78.5 kg)   LMP 10/31/2015   BMI 29.70 kg/m  FHT:  150 BPM  Uterine Size: size equals dates  Presentation: unsure     Assessment:    Pregnancy @ 647w3d weeks   Plan:     labs reviewed, problem list updated Consent signed. GBS sent TDAP offered  Rhogam given for RH negative Pediatrician: discussed. Infant feeding: plans to breastfeed. Maternity leave: discussed. Cigarette smoking: former smoker. No orders of the defined types were placed in this encounter.  No orders of the defined types were placed in this encounter.  Follow up in 1 Week.   Patient ID: Crissie FiguresCornelia S Bartoletti, female   DOB: 06/05/1991, 25 y.o.   MRN: [id number]

## 2016-07-11 ENCOUNTER — Ambulatory Visit (INDEPENDENT_AMBULATORY_CARE_PROVIDER_SITE_OTHER): Payer: Medicaid Other | Admitting: Obstetrics

## 2016-07-11 ENCOUNTER — Encounter: Payer: Self-pay | Admitting: Obstetrics

## 2016-07-11 VITALS — BP 116/76 | HR 73

## 2016-07-11 DIAGNOSIS — Z3483 Encounter for supervision of other normal pregnancy, third trimester: Secondary | ICD-10-CM

## 2016-07-11 DIAGNOSIS — Z3480 Encounter for supervision of other normal pregnancy, unspecified trimester: Secondary | ICD-10-CM

## 2016-07-11 DIAGNOSIS — Z3493 Encounter for supervision of normal pregnancy, unspecified, third trimester: Secondary | ICD-10-CM

## 2016-07-11 DIAGNOSIS — Z3490 Encounter for supervision of normal pregnancy, unspecified, unspecified trimester: Secondary | ICD-10-CM | POA: Insufficient documentation

## 2016-07-11 NOTE — Progress Notes (Signed)
Subjective:    Danett S Romeo AppleHarrison is a 25 y.o. female being seen today for her obstetrical visit. She is at 2356w2d gestation. Patient reports no complaints. Fetal movement: normal.  Problem List Items Addressed This Visit    Supervision of normal pregnancy, antepartum    Other Visit Diagnoses    Encounter for supervision of other normal pregnancy in third trimester    -  Primary   Relevant Orders   Strep Gp B NAA     Patient Active Problem List   Diagnosis Date Noted  . Supervision of normal pregnancy, antepartum 07/11/2016  . Normal delivery 11/15/2011   Objective:    BP 116/76   Pulse 73   LMP 10/31/2015  FHT:  150 BPM  Uterine Size: size equals dates  Presentation: cephalic     Assessment:    Pregnancy @ 9156w2d weeks   Plan:     labs reviewed, problem list updated Consent signed. GBS sent TDAP offered  Rhogam given for RH negative Pediatrician: discussed. Infant feeding: plans to breastfeed. Maternity leave: discussed. Cigarette smoking: former smoker. Orders Placed This Encounter  Procedures  . Strep Gp B NAA   No orders of the defined types were placed in this encounter.  Follow up in 1 Week.   Patient ID: Crissie FiguresCornelia S Bob, female   DOB: 08/18/1991, 25 y.o.   MRN: [id number]

## 2016-07-13 LAB — STREP GP B NAA: Strep Gp B NAA: NEGATIVE

## 2016-07-19 ENCOUNTER — Encounter: Payer: Medicaid Other | Admitting: Obstetrics

## 2016-07-23 ENCOUNTER — Ambulatory Visit (INDEPENDENT_AMBULATORY_CARE_PROVIDER_SITE_OTHER): Payer: Medicaid Other | Admitting: Obstetrics

## 2016-07-23 ENCOUNTER — Encounter: Payer: Self-pay | Admitting: Obstetrics

## 2016-07-23 VITALS — BP 118/73 | HR 90 | Temp 97.6°F | Wt 181.0 lb

## 2016-07-23 DIAGNOSIS — Z3483 Encounter for supervision of other normal pregnancy, third trimester: Secondary | ICD-10-CM

## 2016-07-23 NOTE — Progress Notes (Signed)
Subjective:    Beverly Marquez is a 25 y.o. female being seen today for her obstetrical visit. She is at 2852w0d gestation. Patient reports backache and occasional contractions. Fetal movement: normal.  Problem List Items Addressed This Visit    None    Visit Diagnoses    Encounter for supervision of other normal pregnancy in third trimester    -  Primary     Patient Active Problem List   Diagnosis Date Noted  . Supervision of normal pregnancy, antepartum 07/11/2016  . Normal delivery 11/15/2011    Objective:    BP 118/73   Pulse 90   Temp 97.6 F (36.4 C)   Wt 181 lb (82.1 kg)   LMP 10/31/2015   BMI 31.07 kg/m  FHT: 150 BPM  Uterine Size: size equals dates  Presentations: cephalic    Assessment:    Pregnancy @ 7152w0d weeks   Plan:   Plans for delivery: Vaginal anticipated; labs reviewed; problem list updated Counseling: Consent signed. Infant feeding: plans to breastfeed. Cigarette smoking: former smoker. L&D discussion: symptoms of labor, discussed when to call, discussed what number to call, anesthetic/analgesic options reviewed and delivering clinician:  plans no preference. Postpartum supports and preparation: circumcision discussed and contraception plans discussed.  Follow up in 1 Week.

## 2016-07-30 ENCOUNTER — Encounter: Payer: Self-pay | Admitting: Family Medicine

## 2016-07-30 ENCOUNTER — Ambulatory Visit (INDEPENDENT_AMBULATORY_CARE_PROVIDER_SITE_OTHER): Payer: Medicaid Other | Admitting: Family Medicine

## 2016-07-30 ENCOUNTER — Encounter: Payer: Self-pay | Admitting: *Deleted

## 2016-07-30 VITALS — BP 122/68 | HR 98 | Temp 98.7°F | Wt 180.7 lb

## 2016-07-30 DIAGNOSIS — Z3483 Encounter for supervision of other normal pregnancy, third trimester: Secondary | ICD-10-CM | POA: Diagnosis not present

## 2016-07-30 DIAGNOSIS — Z23 Encounter for immunization: Secondary | ICD-10-CM | POA: Diagnosis not present

## 2016-07-30 DIAGNOSIS — Z3480 Encounter for supervision of other normal pregnancy, unspecified trimester: Secondary | ICD-10-CM

## 2016-07-30 MED ORDER — TETANUS-DIPHTH-ACELL PERTUSSIS 5-2.5-18.5 LF-MCG/0.5 IM SUSP
0.5000 mL | Freq: Once | INTRAMUSCULAR | Status: AC
  Administered 2016-07-30: 0.5 mL via INTRAMUSCULAR

## 2016-07-30 NOTE — Addendum Note (Signed)
Addended by: Francene FindersJAMES, Travor Royce C on: 07/30/2016 01:45 PM   Modules accepted: Orders

## 2016-07-30 NOTE — Progress Notes (Signed)
   PRENATAL VISIT NOTE  Subjective:  Marshawn Mort SawyersS Khatib is a 25 y.o. [phone] [id number] at 2580w0d being seen today for ongoing prenatal care.  She is currently monitored for the following issues for this low-risk pregnancy and has Supervision of normal pregnancy, antepartum on her problem list.  Patient reports no complaints.  Contractions: Irregular. Vag. Bleeding: None.  Movement: Present. Denies leaking of fluid.   The following portions of the patient's history were reviewed and updated as appropriate: allergies, current medications, past family history, past medical history, past social history, past surgical history and problem list. Problem list updated.  Objective:   Vitals:   07/30/16 0859  BP: 122/68  Pulse: 98  Temp: 98.7 F (37.1 C)  Weight: 180 lb 11.2 oz (82 kg)    Fetal Status: Fetal Heart Rate (bpm): 150 Fundal Height: 38 cm Movement: Present  Presentation: Vertex  General:  Alert, oriented and cooperative. Patient is in no acute distress.  Skin: Skin is warm and dry. No rash noted.   Cardiovascular: Normal heart rate noted  Respiratory: Normal respiratory effort, no problems with respiration noted  Abdomen: Soft, gravid, appropriate for gestational age. Pain/Pressure: Present     Pelvic:  Cervical exam performed Dilation: 1.5 Effacement (%): 70 Station: -3  Extremities: Normal range of motion.  Edema: Trace  Mental Status: Normal mood and affect. Normal behavior. Normal judgment and thought content.   Assessment and Plan:  Pregnancy: [id number] at 2380w0d  1. Supervision of other normal pregnancy, antepartum Continue routine prenatal care.  - Flu Vaccine QUAD 36+ mos IM - Tdap vaccine greater than or equal to 7yo IM  Term labor symptoms and general obstetric precautions including but not limited to vaginal bleeding, contractions, leaking of fluid and fetal movement were reviewed in detail with the patient. Please refer to After Visit Summary for other counseling  recommendations.  Return in 1 week (on 08/06/2016).  Reva Boresanya S Vitaly Wanat, MD

## 2016-07-30 NOTE — Patient Instructions (Signed)
Third Trimester of Pregnancy The third trimester is from week 29 through week 42, months 7 through 9. The third trimester is a time when the fetus is growing rapidly. At the end of the ninth month, the fetus is about 20 inches in length and weighs 6-10 pounds.  BODY CHANGES Your body goes through many changes during pregnancy. The changes vary from woman to woman.   Your weight will continue to increase. You can expect to gain 25-35 pounds (11-16 kg) by the end of the pregnancy.  You may begin to get stretch marks on your hips, abdomen, and breasts.  You may urinate more often because the fetus is moving lower into your pelvis and pressing on your bladder.  You may develop or continue to have heartburn as a result of your pregnancy.  You may develop constipation because certain hormones are causing the muscles that push waste through your intestines to slow down.  You may develop hemorrhoids or swollen, bulging veins (varicose veins).  You may have pelvic pain because of the weight gain and pregnancy hormones relaxing your joints between the bones in your pelvis. Backaches may result from overexertion of the muscles supporting your posture.  You may have changes in your hair. These can include thickening of your hair, rapid growth, and changes in texture. Some women also have hair loss during or after pregnancy, or hair that feels dry or thin. Your hair will most likely return to normal after your baby is born.  Your breasts will continue to grow and be tender. A yellow discharge may leak from your breasts called colostrum.  Your belly button may stick out.  You may feel short of breath because of your expanding uterus.  You may notice the fetus "dropping," or moving lower in your abdomen.  You may have a bloody mucus discharge. This usually occurs a few days to a week before labor begins.  Your cervix becomes thin and soft (effaced) near your due date. WHAT TO EXPECT AT YOUR  PRENATAL EXAMS  You will have prenatal exams every 2 weeks until week 36. Then, you will have weekly prenatal exams. During a routine prenatal visit:  You will be weighed to make sure you and the fetus are growing normally.  Your blood pressure is taken.  Your abdomen will be measured to track your baby's growth.  The fetal heartbeat will be listened to.  Any test results from the previous visit will be discussed.  You may have a cervical check near your due date to see if you have effaced. At around 36 weeks, your caregiver will check your cervix. At the same time, your caregiver will also perform a test on the secretions of the vaginal tissue. This test is to determine if a type of bacteria, Group B streptococcus, is present. Your caregiver will explain this further. Your caregiver may ask you:  What your birth plan is.  How you are feeling.  If you are feeling the baby move.  If you have had any abnormal symptoms, such as leaking fluid, bleeding, severe headaches, or abdominal cramping.  If you are using any tobacco products, including cigarettes, chewing tobacco, and electronic cigarettes.  If you have any questions. Other tests or screenings that may be performed during your third trimester include:  Blood tests that check for low iron levels (anemia).  Fetal testing to check the health, activity level, and growth of the fetus. Testing is done if you have certain medical conditions or if   there are problems during the pregnancy.  HIV (human immunodeficiency virus) testing. If you are at high risk, you may be screened for HIV during your third trimester of pregnancy. FALSE LABOR You may feel small, irregular contractions that eventually go away. These are called Braxton Hicks contractions, or false labor. Contractions may last for hours, days, or even weeks before true labor sets in. If contractions come at regular intervals, intensify, or become painful, it is best to be seen  by your caregiver.  SIGNS OF LABOR   Menstrual-like cramps.  Contractions that are 5 minutes apart or less.  Contractions that start on the top of the uterus and spread down to the lower abdomen and back.  A sense of increased pelvic pressure or back pain.  A watery or bloody mucus discharge that comes from the vagina. If you have any of these signs before the 37th week of pregnancy, call your caregiver right away. You need to go to the hospital to get checked immediately. HOME CARE INSTRUCTIONS   Avoid all smoking, herbs, alcohol, and unprescribed drugs. These chemicals affect the formation and growth of the baby.  Do not use any tobacco products, including cigarettes, chewing tobacco, and electronic cigarettes. If you need help quitting, ask your health care provider. You may receive counseling support and other resources to help you quit.  Follow your caregiver's instructions regarding medicine use. There are medicines that are either safe or unsafe to take during pregnancy.  Exercise only as directed by your caregiver. Experiencing uterine cramps is a good sign to stop exercising.  Continue to eat regular, healthy meals.  Wear a good support bra for breast tenderness.  Do not use hot tubs, steam rooms, or saunas.  Wear your seat belt at all times when driving.  Avoid raw meat, uncooked cheese, cat litter boxes, and soil used by cats. These carry germs that can cause birth defects in the baby.  Take your prenatal vitamins.  Take 1500-2000 mg of calcium daily starting at the 20th week of pregnancy until you deliver your baby.  Try taking a stool softener (if your caregiver approves) if you develop constipation. Eat more high-fiber foods, such as fresh vegetables or fruit and whole grains. Drink plenty of fluids to keep your urine clear or pale yellow.  Take warm sitz baths to soothe any pain or discomfort caused by hemorrhoids. Use hemorrhoid cream if your caregiver  approves.  If you develop varicose veins, wear support hose. Elevate your feet for 15 minutes, 3-4 times a day. Limit salt in your diet.  Avoid heavy lifting, wear low heal shoes, and practice good posture.  Rest a lot with your legs elevated if you have leg cramps or low back pain.  Visit your dentist if you have not gone during your pregnancy. Use a soft toothbrush to brush your teeth and be gentle when you floss.  A sexual relationship may be continued unless your caregiver directs you otherwise.  Do not travel far distances unless it is absolutely necessary and only with the approval of your caregiver.  Take prenatal classes to understand, practice, and ask questions about the labor and delivery.  Make a trial run to the hospital.  Pack your hospital bag.  Prepare the baby's nursery.  Continue to go to all your prenatal visits as directed by your caregiver. SEEK MEDICAL CARE IF:  You are unsure if you are in labor or if your water has broken.  You have dizziness.  You have   mild pelvic cramps, pelvic pressure, or nagging pain in your abdominal area.  You have persistent nausea, vomiting, or diarrhea.  You have a bad smelling vaginal discharge.  You have pain with urination. SEEK IMMEDIATE MEDICAL CARE IF:   You have a fever.  You are leaking fluid from your vagina.  You have spotting or bleeding from your vagina.  You have severe abdominal cramping or pain.  You have rapid weight loss or gain.  You have shortness of breath with chest pain.  You notice sudden or extreme swelling of your face, hands, ankles, feet, or legs.  You have not felt your baby move in over an hour.  You have severe headaches that do not go away with medicine.  You have vision changes.   This information is not intended to replace advice given to you by your health care provider. Make sure you discuss any questions you have with your health care provider.   Document Released:  09/25/2001 Document Revised: 10/22/2014 Document Reviewed: 12/02/2012 Elsevier Interactive Patient Education 2016 [employer]  Breastfeeding Deciding to breastfeed is one of the best choices you can make for you and your baby. A change in hormones during pregnancy causes your breast tissue to grow and increases the number and size of your milk ducts. These hormones also allow proteins, sugars, and fats from your blood supply to make breast milk in your milk-producing glands. Hormones prevent breast milk from being released before your baby is born as well as prompt milk flow after birth. Once breastfeeding has begun, thoughts of your baby, as well as his or her sucking or crying, can stimulate the release of milk from your milk-producing glands.  BENEFITS OF BREASTFEEDING For Your Baby  Your first milk (colostrum) helps your baby's digestive system function better.  There are antibodies in your milk that help your baby fight off infections.  Your baby has a lower incidence of asthma, allergies, and sudden infant death syndrome.  The nutrients in breast milk are better for your baby than infant formulas and are designed uniquely for your baby's needs.  Breast milk improves your baby's brain development.  Your baby is less likely to develop other conditions, such as childhood obesity, asthma, or type 2 diabetes mellitus. For You  Breastfeeding helps to create a very special bond between you and your baby.  Breastfeeding is convenient. Breast milk is always available at the correct temperature and costs nothing.  Breastfeeding helps to burn calories and helps you lose the weight gained during pregnancy.  Breastfeeding makes your uterus contract to its prepregnancy size faster and slows bleeding (lochia) after you give birth.   Breastfeeding helps to lower your risk of developing type 2 diabetes mellitus, osteoporosis, and breast or ovarian cancer later in life. SIGNS THAT YOUR BABY IS  HUNGRY Early Signs of Hunger  Increased alertness or activity.  Stretching.  Movement of the head from side to side.  Movement of the head and opening of the mouth when the corner of the mouth or cheek is stroked (rooting).  Increased sucking sounds, smacking lips, cooing, sighing, or squeaking.  Hand-to-mouth movements.  Increased sucking of fingers or hands. Late Signs of Hunger  Fussing.  Intermittent crying. Extreme Signs of Hunger Signs of extreme hunger will require calming and consoling before your baby will be able to breastfeed successfully. Do not wait for the following signs of extreme hunger to occur before you initiate breastfeeding:  Restlessness.  A loud, strong cry.  Screaming.   BREASTFEEDING BASICS Breastfeeding Initiation  Find a comfortable place to sit or lie down, with your neck and back well supported.  Place a pillow or rolled up blanket under your baby to bring him or her to the level of your breast (if you are seated). Nursing pillows are specially designed to help support your arms and your baby while you breastfeed.  Make sure that your baby's abdomen is facing your abdomen.  Gently massage your breast. With your fingertips, massage from your chest wall toward your nipple in a circular motion. This encourages milk flow. You may need to continue this action during the feeding if your milk flows slowly.  Support your breast with 4 fingers underneath and your thumb above your nipple. Make sure your fingers are well away from your nipple and your baby's mouth.  Stroke your baby's lips gently with your finger or nipple.  When your baby's mouth is open wide enough, quickly bring your baby to your breast, placing your entire nipple and as much of the colored area around your nipple (areola) as possible into your baby's mouth.  More areola should be visible above your baby's upper lip than below the lower lip.  Your baby's tongue should be between his  or her lower gum and your breast.  Ensure that your baby's mouth is correctly positioned around your nipple (latched). Your baby's lips should create a seal on your breast and be turned out (everted).  It is common for your baby to suck about 2-3 minutes in order to start the flow of breast milk. Latching Teaching your baby how to latch on to your breast properly is very important. An improper latch can cause nipple pain and decreased milk supply for you and poor weight gain in your baby. Also, if your baby is not latched onto your nipple properly, he or she may swallow some air during feeding. This can make your baby fussy. Burping your baby when you switch breasts during the feeding can help to get rid of the air. However, teaching your baby to latch on properly is still the best way to prevent fussiness from swallowing air while breastfeeding. Signs that your baby has successfully latched on to your nipple:  Silent tugging or silent sucking, without causing you pain.  Swallowing heard between every 3-4 sucks.  Muscle movement above and in front of his or her ears while sucking. Signs that your baby has not successfully latched on to nipple:  Sucking sounds or smacking sounds from your baby while breastfeeding.  Nipple pain. If you think your baby has not latched on correctly, slip your finger into the corner of your baby's mouth to break the suction and place it between your baby's gums. Attempt breastfeeding initiation again. Signs of Successful Breastfeeding Signs from your baby:  A gradual decrease in the number of sucks or complete cessation of sucking.  Falling asleep.  Relaxation of his or her body.  Retention of a small amount of milk in his or her mouth.  Letting go of your breast by himself or herself. Signs from you:  Breasts that have increased in firmness, weight, and size 1-3 hours after feeding.  Breasts that are softer immediately after  breastfeeding.  Increased milk volume, as well as a change in milk consistency and color by the fifth day of breastfeeding.  Nipples that are not sore, cracked, or bleeding. Signs That Your Baby is Getting Enough Milk  Wetting at least 3 diapers in a 24-hour period.   The urine should be clear and pale yellow by age 5 days.  At least 3 stools in a 24-hour period by age 5 days. The stool should be soft and yellow.  At least 3 stools in a 24-hour period by age 7 days. The stool should be seedy and yellow.  No loss of weight greater than 10% of birth weight during the first 3 days of age.  Average weight gain of 4-7 ounces (113-198 g) per week after age 4 days.  Consistent daily weight gain by age 5 days, without weight loss after the age of 2 weeks. After a feeding, your baby may spit up a small amount. This is common. BREASTFEEDING FREQUENCY AND DURATION Frequent feeding will help you make more milk and can prevent sore nipples and breast engorgement. Breastfeed when you feel the need to reduce the fullness of your breasts or when your baby shows signs of hunger. This is called "breastfeeding on demand." Avoid introducing a pacifier to your baby while you are working to establish breastfeeding (the first 4-6 weeks after your baby is born). After this time you may choose to use a pacifier. Research has shown that pacifier use during the first year of a baby's life decreases the risk of sudden infant death syndrome (SIDS). Allow your baby to feed on each breast as long as he or she wants. Breastfeed until your baby is finished feeding. When your baby unlatches or falls asleep while feeding from the first breast, offer the second breast. Because newborns are often sleepy in the first few weeks of life, you may need to awaken your baby to get him or her to feed. Breastfeeding times will vary from baby to baby. However, the following rules can serve as a guide to help you ensure that your baby is  properly fed:  Newborns (babies 4 weeks of age or younger) may breastfeed every 1-3 hours.  Newborns should not go longer than 3 hours during the day or 5 hours during the night without breastfeeding.  You should breastfeed your baby a minimum of 8 times in a 24-hour period until you begin to introduce solid foods to your baby at around 6 months of age. BREAST MILK PUMPING Pumping and storing breast milk allows you to ensure that your baby is exclusively fed your breast milk, even at times when you are unable to breastfeed. This is especially important if you are going back to work while you are still breastfeeding or when you are not able to be present during feedings. Your lactation consultant can give you guidelines on how long it is safe to store breast milk. A breast pump is a machine that allows you to pump milk from your breast into a sterile bottle. The pumped breast milk can then be stored in a refrigerator or freezer. Some breast pumps are operated by hand, while others use electricity. Ask your lactation consultant which type will work best for you. Breast pumps can be purchased, but some hospitals and breastfeeding support groups lease breast pumps on a monthly basis. A lactation consultant can teach you how to hand express breast milk, if you prefer not to use a pump. CARING FOR YOUR BREASTS WHILE YOU BREASTFEED Nipples can become dry, cracked, and sore while breastfeeding. The following recommendations can help keep your breasts moisturized and healthy:  Avoid using soap on your nipples.  Wear a supportive bra. Although not required, special nursing bras and tank tops are designed to allow access to your   breasts for breastfeeding without taking off your entire bra or top. Avoid wearing underwire-style bras or extremely tight bras.  Air dry your nipples for 3-4minutes after each feeding.  Use only cotton bra pads to absorb leaked breast milk. Leaking of breast milk between feedings  is normal.  Use lanolin on your nipples after breastfeeding. Lanolin helps to maintain your skin's normal moisture barrier. If you use pure lanolin, you do not need to wash it off before feeding your baby again. Pure lanolin is not toxic to your baby. You may also hand express a few drops of breast milk and gently massage that milk into your nipples and allow the milk to air dry. In the first few weeks after giving birth, some women experience extremely full breasts (engorgement). Engorgement can make your breasts feel heavy, warm, and tender to the touch. Engorgement peaks within 3-5 days after you give birth. The following recommendations can help ease engorgement:  Completely empty your breasts while breastfeeding or pumping. You may want to start by applying warm, moist heat (in the shower or with warm water-soaked hand towels) just before feeding or pumping. This increases circulation and helps the milk flow. If your baby does not completely empty your breasts while breastfeeding, pump any extra milk after he or she is finished.  Wear a snug bra (nursing or regular) or tank top for 1-2 days to signal your body to slightly decrease milk production.  Apply ice packs to your breasts, unless this is too uncomfortable for you.  Make sure that your baby is latched on and positioned properly while breastfeeding. If engorgement persists after 48 hours of following these recommendations, contact your health care provider or a lactation consultant. OVERALL HEALTH CARE RECOMMENDATIONS WHILE BREASTFEEDING  Eat healthy foods. Alternate between meals and snacks, eating 3 of each per day. Because what you eat affects your breast milk, some of the foods may make your baby more irritable than usual. Avoid eating these foods if you are sure that they are negatively affecting your baby.  Drink milk, fruit juice, and water to satisfy your thirst (about 10 glasses a day).  Rest often, relax, and continue to take  your prenatal vitamins to prevent fatigue, stress, and anemia.  Continue breast self-awareness checks.  Avoid chewing and smoking tobacco. Chemicals from cigarettes that pass into breast milk and exposure to secondhand smoke may harm your baby.  Avoid alcohol and drug use, including marijuana. Some medicines that may be harmful to your baby can pass through breast milk. It is important to ask your health care provider before taking any medicine, including all over-the-counter and prescription medicine as well as vitamin and herbal supplements. It is possible to become pregnant while breastfeeding. If birth control is desired, ask your health care provider about options that will be safe for your baby. SEEK MEDICAL CARE IF:  You feel like you want to stop breastfeeding or have become frustrated with breastfeeding.  You have painful breasts or nipples.  Your nipples are cracked or bleeding.  Your breasts are red, tender, or warm.  You have a swollen area on either breast.  You have a fever or chills.  You have nausea or vomiting.  You have drainage other than breast milk from your nipples.  Your breasts do not become full before feedings by the fifth day after you give birth.  You feel sad and depressed.  Your baby is too sleepy to eat well.  Your baby is having trouble sleeping.     Your baby is wetting less than 3 diapers in a 24-hour period.  Your baby has less than 3 stools in a 24-hour period.  Your baby's skin or the white part of his or her eyes becomes yellow.   Your baby is not gaining weight by 5 days of age. SEEK IMMEDIATE MEDICAL CARE IF:  Your baby is overly tired (lethargic) and does not want to wake up and feed.  Your baby develops an unexplained fever.   This information is not intended to replace advice given to you by your health care provider. Make sure you discuss any questions you have with your health care provider.   Document Released: 10/01/2005  Document Revised: 06/22/2015 Document Reviewed: 03/25/2013 Elsevier Interactive Patient Education 2016 [employer]  

## 2016-08-01 ENCOUNTER — Inpatient Hospital Stay (HOSPITAL_COMMUNITY): Payer: Medicaid Other | Admitting: Anesthesiology

## 2016-08-01 ENCOUNTER — Encounter (HOSPITAL_COMMUNITY): Payer: Self-pay | Admitting: *Deleted

## 2016-08-01 ENCOUNTER — Inpatient Hospital Stay (HOSPITAL_COMMUNITY)
Admission: AD | Admit: 2016-08-01 | Discharge: 2016-08-02 | DRG: 775 | Disposition: A | Discharge status: Home / Self Care | Payer: Medicaid Other | Source: Ambulatory Visit | Attending: Family Medicine | Admitting: Family Medicine

## 2016-08-01 DIAGNOSIS — Z87891 Personal history of nicotine dependence: Secondary | ICD-10-CM | POA: Diagnosis not present

## 2016-08-01 DIAGNOSIS — O4202 Full-term premature rupture of membranes, onset of labor within 24 hours of rupture: Secondary | ICD-10-CM | POA: Diagnosis present

## 2016-08-01 DIAGNOSIS — O9081 Anemia of the puerperium: Secondary | ICD-10-CM | POA: Diagnosis not present

## 2016-08-01 DIAGNOSIS — Z3480 Encounter for supervision of other normal pregnancy, unspecified trimester: Secondary | ICD-10-CM

## 2016-08-01 DIAGNOSIS — Z3A39 39 weeks gestation of pregnancy: Secondary | ICD-10-CM

## 2016-08-01 DIAGNOSIS — D649 Anemia, unspecified: Secondary | ICD-10-CM | POA: Diagnosis not present

## 2016-08-01 DIAGNOSIS — O4290 Premature rupture of membranes, unspecified as to length of time between rupture and onset of labor, unspecified weeks of gestation: Secondary | ICD-10-CM | POA: Diagnosis present

## 2016-08-01 LAB — CBC
HCT: 31.2 % — ABNORMAL LOW (ref 36.0–46.0)
Hemoglobin: 10.5 g/dL — ABNORMAL LOW (ref 12.0–15.0)
MCH: 32.9 pg (ref 26.0–34.0)
MCHC: 33.7 g/dL (ref 30.0–36.0)
MCV: 97.8 fL (ref 78.0–100.0)
PLATELETS: 350 10*3/uL (ref 150–400)
RBC: 3.19 MIL/uL — AB (ref 3.87–5.11)
RDW: 13.9 % (ref 11.5–15.5)
WBC: 9.5 10*3/uL (ref 4.0–10.5)

## 2016-08-01 LAB — POCT FERN TEST: POCT FERN TEST: NEGATIVE

## 2016-08-01 LAB — TYPE AND SCREEN
ABO/RH(D): O POS
Antibody Screen: NEGATIVE

## 2016-08-01 LAB — AMNISURE RUPTURE OF MEMBRANE (ROM) NOT AT ARMC: Amnisure ROM: POSITIVE

## 2016-08-01 MED ORDER — EPHEDRINE 5 MG/ML INJ
10.0000 mg | INTRAVENOUS | Status: DC | PRN
  Filled 2016-08-01: qty 4

## 2016-08-01 MED ORDER — DIPHENHYDRAMINE HCL 50 MG/ML IJ SOLN: 12.5000 mg | INTRAMUSCULAR | Status: DC | PRN

## 2016-08-01 MED ORDER — SIMETHICONE 80 MG PO CHEW: 80.0000 mg | CHEWABLE_TABLET | ORAL | Status: DC | PRN

## 2016-08-01 MED ORDER — BENZOCAINE-MENTHOL 20-0.5 % EX AERO: 1.0000 "application " | INHALATION_SPRAY | CUTANEOUS | Status: DC | PRN

## 2016-08-01 MED ORDER — TETANUS-DIPHTH-ACELL PERTUSSIS 5-2.5-18.5 LF-MCG/0.5 IM SUSP: 0.5000 mL | Freq: Once | INTRAMUSCULAR | Status: DC

## 2016-08-01 MED ORDER — COCONUT OIL OIL: 1.0000 "application " | TOPICAL_OIL | Status: DC | PRN

## 2016-08-01 MED ORDER — LACTATED RINGERS IV SOLN
INTRAVENOUS | Status: DC
  Administered 2016-08-01: 13:00:00 via INTRAVENOUS

## 2016-08-01 MED ORDER — LACTATED RINGERS IV SOLN
500.0000 mL | Freq: Once | INTRAVENOUS | Status: AC
  Administered 2016-08-01: 500 mL via INTRAVENOUS

## 2016-08-01 MED ORDER — ACETAMINOPHEN 325 MG PO TABS
650.0000 mg | ORAL_TABLET | ORAL | Status: DC | PRN
Start: 2016-08-01 — End: 2016-08-02

## 2016-08-01 MED ORDER — LIDOCAINE HCL (PF) 1 % IJ SOLN
INTRAMUSCULAR | Status: DC | PRN
  Administered 2016-08-01: 7 mL via EPIDURAL
  Administered 2016-08-01: 7 mL

## 2016-08-01 MED ORDER — TERBUTALINE SULFATE 1 MG/ML IJ SOLN
0.2500 mg | Freq: Once | INTRAMUSCULAR | Status: DC | PRN
  Filled 2016-08-01: qty 1

## 2016-08-01 MED ORDER — FENTANYL CITRATE (PF) 100 MCG/2ML IJ SOLN: 100.0000 ug | INTRAMUSCULAR | Status: DC | PRN

## 2016-08-01 MED ORDER — OXYTOCIN 40 UNITS IN LACTATED RINGERS INFUSION - SIMPLE MED
1.0000 m[IU]/min | INTRAVENOUS | Status: DC
  Administered 2016-08-01: 2 m[IU]/min via INTRAVENOUS
  Filled 2016-08-01: qty 1000

## 2016-08-01 MED ORDER — OXYTOCIN BOLUS FROM INFUSION: 500.0000 mL | Freq: Once | INTRAVENOUS | Status: DC

## 2016-08-01 MED ORDER — DIBUCAINE 1 % RE OINT: 1.0000 "application " | TOPICAL_OINTMENT | RECTAL | Status: DC | PRN

## 2016-08-01 MED ORDER — METHYLERGONOVINE MALEATE 0.2 MG PO TABS: 0.2000 mg | ORAL_TABLET | Freq: Three times a day (TID) | ORAL | Status: DC

## 2016-08-01 MED ORDER — ONDANSETRON HCL 4 MG/2ML IJ SOLN: 4.0000 mg | Freq: Four times a day (QID) | INTRAMUSCULAR | Status: DC | PRN

## 2016-08-01 MED ORDER — FENTANYL 2.5 MCG/ML BUPIVACAINE 1/10 % EPIDURAL INFUSION (WH - ANES)
14.0000 mL/h | INTRAMUSCULAR | Status: DC | PRN
  Administered 2016-08-01: 14 mL/h via EPIDURAL
  Filled 2016-08-01: qty 125

## 2016-08-01 MED ORDER — IBUPROFEN 600 MG PO TABS
600.0000 mg | ORAL_TABLET | Freq: Four times a day (QID) | ORAL | Status: DC
  Administered 2016-08-01 – 2016-08-02 (×4): 600 mg via ORAL
  Filled 2016-08-01 (×4): qty 1

## 2016-08-01 MED ORDER — OXYTOCIN 40 UNITS IN LACTATED RINGERS INFUSION - SIMPLE MED: 2.5000 [IU]/h | INTRAVENOUS | Status: DC

## 2016-08-01 MED ORDER — SOD CITRATE-CITRIC ACID 500-334 MG/5ML PO SOLN: 30.0000 mL | ORAL | Status: DC | PRN

## 2016-08-01 MED ORDER — PHENYLEPHRINE 40 MCG/ML (10ML) SYRINGE FOR IV PUSH (FOR BLOOD PRESSURE SUPPORT)
80.0000 ug | PREFILLED_SYRINGE | INTRAVENOUS | Status: DC | PRN
  Filled 2016-08-01: qty 10
  Filled 2016-08-01: qty 5

## 2016-08-01 MED ORDER — LIDOCAINE HCL (PF) 1 % IJ SOLN
30.0000 mL | INTRAMUSCULAR | Status: DC | PRN
  Filled 2016-08-01: qty 30

## 2016-08-01 MED ORDER — ZOLPIDEM TARTRATE 5 MG PO TABS: 5.0000 mg | ORAL_TABLET | Freq: Every evening | ORAL | Status: DC | PRN

## 2016-08-01 MED ORDER — LACTATED RINGERS IV SOLN
500.0000 mL | INTRAVENOUS | Status: DC | PRN
  Administered 2016-08-01: 500 mL via INTRAVENOUS

## 2016-08-01 MED ORDER — WITCH HAZEL-GLYCERIN EX PADS: 1.0000 "application " | MEDICATED_PAD | CUTANEOUS | Status: DC | PRN

## 2016-08-01 MED ORDER — METHYLERGONOVINE MALEATE 0.2 MG PO TABS: 0.2000 mg | ORAL_TABLET | Freq: Once | ORAL | Status: DC

## 2016-08-01 MED ORDER — ACETAMINOPHEN 325 MG PO TABS: 650.0000 mg | ORAL_TABLET | ORAL | Status: DC | PRN

## 2016-08-01 MED ORDER — PRENATAL MULTIVITAMIN CH
1.0000 | ORAL_TABLET | Freq: Every day | ORAL | Status: DC
  Administered 2016-08-02: 1 via ORAL
  Filled 2016-08-01: qty 1

## 2016-08-01 MED ORDER — PHENYLEPHRINE 40 MCG/ML (10ML) SYRINGE FOR IV PUSH (FOR BLOOD PRESSURE SUPPORT)
80.0000 ug | PREFILLED_SYRINGE | INTRAVENOUS | Status: DC | PRN
  Filled 2016-08-01: qty 5

## 2016-08-01 MED ORDER — METHYLERGONOVINE MALEATE 0.2 MG PO TABS
0.2000 mg | ORAL_TABLET | Freq: Once | ORAL | Status: AC
  Administered 2016-08-01: 0.2 mg via ORAL
  Filled 2016-08-01: qty 1

## 2016-08-01 MED ORDER — ONDANSETRON HCL 4 MG PO TABS
4.0000 mg | ORAL_TABLET | ORAL | Status: DC | PRN
Start: 2016-08-01 — End: 2016-08-02

## 2016-08-01 MED ORDER — DIPHENHYDRAMINE HCL 25 MG PO CAPS: 25.0000 mg | ORAL_CAPSULE | Freq: Four times a day (QID) | ORAL | Status: DC | PRN

## 2016-08-01 MED ORDER — ONDANSETRON HCL 4 MG/2ML IJ SOLN: 4.0000 mg | INTRAMUSCULAR | Status: DC | PRN

## 2016-08-01 MED ORDER — SENNOSIDES-DOCUSATE SODIUM 8.6-50 MG PO TABS
2.0000 | ORAL_TABLET | ORAL | Status: DC
  Administered 2016-08-01: 2 via ORAL
  Filled 2016-08-01: qty 2

## 2016-08-01 NOTE — Anesthesia Preprocedure Evaluation (Signed)
Anesthesia Evaluation  Patient identified by MRN, date of birth, ID band Patient awake    Reviewed: Allergy & Precautions, H&P , NPO status , Patient's Chart, lab work & pertinent test results  Airway Mallampati: I  TM Distance: >3 FB Neck ROM: full    Dental no notable dental hx.    Pulmonary neg pulmonary ROS, former smoker,    Pulmonary exam normal        Cardiovascular negative cardio ROS Normal cardiovascular exam     Neuro/Psych negative neurological ROS  negative psych ROS   GI/Hepatic negative GI ROS, Neg liver ROS,   Endo/Other  negative endocrine ROS  Renal/GU negative Renal ROS     Musculoskeletal   Abdominal Normal abdominal exam  (+)   Peds  Hematology negative hematology ROS (+)   Anesthesia Other Findings   Reproductive/Obstetrics (+) Pregnancy                             Anesthesia Physical Anesthesia Plan  ASA: II  Anesthesia Plan: Epidural   Post-op Pain Management:    Induction:   Airway Management Planned:   Additional Equipment:   Intra-op Plan:   Post-operative Plan:   Informed Consent: I have reviewed the patients History and Physical, chart, labs and discussed the procedure including the risks, benefits and alternatives for the proposed anesthesia with the patient or authorized representative who has indicated his/her understanding and acceptance.     Plan Discussed with:   Anesthesia Plan Comments:         Anesthesia Quick Evaluation  

## 2016-08-01 NOTE — Anesthesia Postprocedure Evaluation (Signed)
Anesthesia Post Note  Patient: Siana S Issa  Procedure(s) Performed: * No procedures listed *  Patient location during evaluation: Mother Baby Anesthesia Type: Epidural Level of consciousness: awake and alert Pain management: satisfactory to patient Vital Signs Assessment: post-procedure vital signs reviewed and stable Respiratory status: respiratory function stable Cardiovascular status: stable Postop Assessment: no headache, no backache, epidural receding, patient able to bend at knees, no signs of nausea or vomiting and adequate PO intake Anesthetic complications: no     Last Vitals:  Vitals:   08/01/16 1715 08/01/16 1805  BP: 128/69 120/80  Pulse: 78 77  Resp: 20 18  Temp: 36.8 C 36.6 C    Last Pain:  Vitals:   08/01/16 1805  TempSrc: Oral  PainSc: 0-No pain   Pain Goal:                 Tyreke Kaeser

## 2016-08-01 NOTE — Progress Notes (Signed)
Patient was seen and evaluated at the bed side. Category 1 tracing. Afebrile HD stable, Mild contractions. Plan for cervical exam @1230 .

## 2016-08-01 NOTE — H&P (Signed)
LABOR AND DELIVERY ADMISSION HISTORY AND PHYSICAL NOTE  Beverly Marquez is a 25 y.o. female [id number] with IUP at [redacted]w[redacted]d by L/19 presenting for leakage of fluid. Began at 1 am this morning. Few hours ago started having moderately painful contractions every few minutes. No fever, no bleeding. Positive fetal movement.    Prenatal History/Complications:  Past Medical History: Past Medical History:  Diagnosis Date  . Asthma    childhood  . No pertinent past medical history   . Trichomonas     Past Surgical History: Past Surgical History:  Procedure Laterality Date  . NO PAST SURGERIES      Obstetrical History: OB History    Gravida Para Term Preterm AB Living   4 2 2   1 2    SAB TAB Ectopic Multiple Live Births   1       2      Social History: Social History   Social History  . Marital status: Single    Spouse name: N/A  . Number of children: 2  . Years of education: N/A   Social History Main Topics  . Smoking status: Former Smoker    Types: Cigarettes  . Smokeless tobacco: Never Used  . Alcohol use No  . Drug use: No  . Sexual activity: Yes    Partners: Male    Birth control/ protection: None   Other Topics Concern  . None   Social History Narrative  . None    Family History: Family History  Problem Relation Age of Onset  . Other Neg Hx     Allergies: No Known Allergies  Prescriptions Prior to Admission  Medication Sig Dispense Refill Last Dose  . Iron Polysacch Cmplx-B12-FA 150-0.025-1 MG CAPS Take 1 tablet by mouth daily. 30 each 4 07/31/2016 at Unknown time  . Prenatal Vit-Fe Fumarate-FA (PRENATAL MULTIVITAMIN) TABS tablet Take 1 tablet by mouth daily at 12 noon.   07/31/2016 at Unknown time     Review of Systems   All systems reviewed and negative except as stated in HPI  Blood pressure 122/72, pulse 77, temperature 97.4 F (36.3 C), temperature source Oral, resp. rate 16, height 5\' 5"  (1.651 m), weight 175 lb 8 oz (79.6 kg), last  menstrual period 10/31/2015, unknown if currently breastfeeding. General appearance: alert, cooperative, appears stated age and mild distress Lungs: clear to auscultation bilaterally Heart: regular rate and rhythm Abdomen: soft, non-tender; bowel sounds normal Extremities: No calf swelling or tenderness Presentation: cephalic Fetal monitoring: 140/mod/+a/-d Uterine activity: q 5 min Dilation: 3.5 Effacement (%): 50 Station: -3 Exam by:: dr Ashok Pall   Prenatal labs: ABO, Rh: O/Positive/-- (03/02 0000) Antibody: Negative (03/02 0000) Rubella: !Error!imm RPR: Non Reactive (08/02 1040)  HBsAg: Negative (03/02 0000)  HIV: Non Reactive (08/02 1040)  GBS: Negative (09/27 1323)  2 hr Glucola: 72/96/82 Genetic screening:  Declined or not performed Anatomy US: wnl  Prenatal Transfer Tool  Maternal Diabetes: No Genetic Screening: Declined Maternal Ultrasounds/Referrals: Normal Fetal Ultrasounds or other Referrals:  None Maternal Substance Abuse:  No Significant Maternal Medications:  None Significant Maternal Lab Results: Lab values include: Group B Strep negative  Results for orders placed or performed during the hospital encounter of 08/01/16 (from the past 24 hour(s))  Fern Test   Collection Time: 08/01/16  7:50 AM  Result Value Ref Range   POCT Fern Test Negative = intact amniotic membranes   Amnisure rupture of membrane (rom)not at [facility]   Collection Time: 08/01/16  8:05 AM  Result Value Ref Range   Amnisure ROM POSITIVE     Patient Active Problem List   Diagnosis Date Noted  . Supervision of normal pregnancy, antepartum 07/11/2016    Assessment: Beverly Marquez is a 25 y.o. [id number] at 2365w2d here for prom at 01:00 10/18, now with onset of labor. amnisure positive.  #Labor: expectant. No signs triple i #Pain: Iv narcotics, eventual epidural #FWB: Cat 1 #ID:  gbs neg #MOF: br/bottle #MOC: ocps @ 6 wks #Circ:  n/a  Beverly Marquez 08/01/2016, 9:17 AM

## 2016-08-01 NOTE — Progress Notes (Signed)
Was called to the patient's room when initial evaluation revealed passing of small to medium sized clots. Ordered 0.2 mg oral methergine. Fundal massage performed. Approximately 200cc of clot was passed while emptying bladder. Continued fundal massage revealed no additional blood/clot passing. Patient is in NAD at this time with apparent resolution of clot passing. Will reevaluate patient as needed.  

## 2016-08-01 NOTE — Progress Notes (Signed)
Pt up to bathroom. Voided x2. MD at bs for assessment.   1800: FF/U scant rubra lochia.

## 2016-08-01 NOTE — Anesthesia Pain Management Evaluation Note (Signed)
  CRNA Pain Management Visit Note  Patient: Beverly Marquez, 25 y.o., female  "Hello I am a member of the anesthesia team at [facility]. We have an anesthesia team available at all times to provide care throughout the hospital, including epidural management and anesthesia for C-section. I don't know your plan for the delivery whether it a natural birth, water birth, IV sedation, nitrous supplementation, doula or epidural, but we want to meet your pain goals."   1.Was your pain managed to your expectations on prior hospitalizations?   Yes   2.What is your expectation for pain management during this hospitalization?     Epidural  3.How can we help you reach that goal? Epidural intact and working well  Record the patient's initial score and the patient's pain goal.   Pain: 0  Pain Goal: 6 [facility] wants you to be able to say your pain was always managed very well.  Edison PaceWILKERSON,Zoraya Fiorenza 08/01/2016

## 2016-08-01 NOTE — Anesthesia Procedure Notes (Signed)
Epidural Patient location during procedure: OB Start time: 08/01/2016 11:44 AM End time: 08/01/2016 11:48 AM  Staffing Anesthesiologist: Leilani AbleHATCHETT, Kalel Harty Performed: anesthesiologist   Preanesthetic Checklist Completed: patient identified, surgical consent, pre-op evaluation, timeout performed, IV checked, risks and benefits discussed and monitors and equipment checked  Epidural Patient position: sitting Prep: site prepped and draped and DuraPrep Patient monitoring: continuous pulse ox and blood pressure Approach: midline Injection technique: LOR air  Needle:  Needle type: Tuohy  Needle gauge: 17 G Needle length: 9 cm and 9 Needle insertion depth: 5 cm cm Catheter type: closed end flexible Catheter size: 19 Gauge Catheter at skin depth: 10 cm Test dose: negative and Other  Assessment Sensory level: T9 Events: blood not aspirated, injection not painful, no injection resistance, negative IV test and no paresthesia  Additional Notes Reason for block:procedure for pain

## 2016-08-01 NOTE — Lactation Note (Signed)
This note was copied from a baby's chart. Lactation Consultation Note  Patient Name: Beverly Marquez Today's Date: 08/01/2016 Reason for consult: Initial assessment   Initial assessment with Exp [place] [facility] [place] [facility] Infant was being assessed by NNP. She had previously BF.   Mom reports she BF her other children for 9 and 4 months and denied issues.   Mom with large compressible breast and areola. Mom reports she is aware of how to hand express and was doing so with latch. Enc her to hand express prior to feeding and to hand express post feed to put EBM on nipples.   Enc mom to feed infant 8-12 x in 24 hours at first feeding cues. Mom latched infant to breast independently. Enc mom to lean back with feeding and to pull infant onto breast and to massage/compress breast with feeding. Enc mom to pull infant in close so that nose is touching breast with feeding.   BF Resources Handout and LC Brochure given, informed of IP/OP Services, BF Support Groups and LC phone #. Enc mom to call out to desk for feeding assistance as needed. Follow up tomorrow and prn.    Maternal Data Formula Feeding for Exclusion: No Has patient been taught Hand Expression?: Yes Does the patient have breastfeeding experience prior to this delivery?: Yes  Feeding Feeding Type: Breast Fed Length of feed: 10 min  LATCH Score/Interventions Latch: Grasps breast easily, tongue down, lips flanged, rhythmical sucking.  Audible Swallowing: Spontaneous and intermittent  Type of Nipple: Everted at rest and after stimulation  Comfort (Breast/Nipple): Soft / non-tender     Hold (Positioning): No assistance needed to correctly position infant at breast.  LATCH Score: 10  Lactation Tools Discussed/Used WIC Program: Yes   Consult Status Consult Status: Follow-up Date: 08/02/16 Follow-up type: In-patient    Silas FloodSharon S Julyanna Scholle 08/01/2016, 4:42 PM

## 2016-08-01 NOTE — Progress Notes (Signed)
Was called to the patient's room when initial evaluation revealed passing of small to medium sized clots. Ordered 0.2 mg oral methergine. Fundal massage performed. Approximately 200cc of clot was passed while emptying bladder. Continued fundal massage revealed no additional blood/clot passing. Patient is in NAD at this time with apparent resolution of clot passing. Will reevaluate patient as needed.

## 2016-08-01 NOTE — MAU Note (Signed)
During the night felt like she peed on herself, since then has continued to have on going leakage.  Small amt of blood tinged mucous this morning.not really contractions.  Was 1 when last checked. No problems with preg.

## 2016-08-02 LAB — RPR: RPR: NONREACTIVE

## 2016-08-02 MED ORDER — COCONUT OIL OIL: 1.0000 "application " | TOPICAL_OIL | 99 refills | Status: DC | PRN

## 2016-08-02 MED ORDER — SENNOSIDES-DOCUSATE SODIUM 8.6-50 MG PO TABS: 2.0000 | ORAL_TABLET | Freq: Two times a day (BID) | ORAL | 2 refills | Status: DC

## 2016-08-02 MED ORDER — POLYSACCHARIDE IRON COMPLEX 150 MG PO CAPS
150.0000 mg | ORAL_CAPSULE | Freq: Every day | ORAL | Status: DC
  Administered 2016-08-02: 150 mg via ORAL
  Filled 2016-08-02: qty 1

## 2016-08-02 MED ORDER — IBUPROFEN 600 MG PO TABS: 600.0000 mg | ORAL_TABLET | Freq: Four times a day (QID) | ORAL | 2 refills | Status: DC

## 2016-08-02 MED ORDER — OXYCODONE-ACETAMINOPHEN 5-325 MG PO TABS: 1.0000 | ORAL_TABLET | ORAL | Status: DC | PRN

## 2016-08-02 MED ORDER — OXYCODONE-ACETAMINOPHEN 5-325 MG PO TABS: 1.0000 | ORAL_TABLET | ORAL | 0 refills | Status: DC | PRN

## 2016-08-02 NOTE — Progress Notes (Addendum)
Post Partum Day #1 Subjective: up ad lib, voiding, tolerating PO and reports cramping with breast feeding.  Breast feeding is going well, baby latched when entered the room.  Objective: Blood pressure 111/64, pulse 79, temperature 97.3 F (36.3 C), temperature source Oral, resp. rate 18, height 5\' 5"  (1.651 m), weight 175 lb 8 oz (79.6 kg), last menstrual period 10/31/2015, SpO2 100 %, unknown if currently breastfeeding.  Physical Exam:  General: alert, cooperative and no distress Lochia: appropriate Uterine Fundus: firm Incision: none DVT Evaluation: No evidence of DVT seen on physical exam. No cords or calf tenderness. No significant calf/ankle edema.   Recent Labs  08/01/16 0940  HGB 10.5*  HCT 31.2*    Assessment/Plan: Plan for discharge tomorrow, Breastfeeding, Lactation consult and Contraception POP  Anemia: iron started, not symptomatic.    LOS: 1 day   Roe CoombsRachelle A Jerald Hennington, CNM 08/02/2016, 8:32 AM

## 2016-08-02 NOTE — Progress Notes (Signed)
Patient aware of pain meds she can have and plan of care for the day. I went to do an assesment and patient demanded she shower and persistantly asked about her IV coming out. Patient did not have any more clots and bleeding was normal. Patient seems to like to do her own thing.

## 2016-08-02 NOTE — Lactation Note (Signed)
This note was copied from a baby's chart. Lactation Consultation Note  Patient Name: Beverly Wrenley Romeo AppleHarrison ONGEX'BToday's Date: 08/02/2016 Reason for consult: Follow-up assessment  Mother declines LC assist. She has no questions or concerns at this time.  Lurline HareRichey, Oleg Oleson [facility] 08/02/2016, 6:37 PM

## 2016-08-02 NOTE — Discharge Instructions (Signed)
Breast Pumping Tips °If you are breastfeeding, there may be times when you cannot feed your baby directly. Returning to work or going on a trip are common examples. Pumping allows you to store breast milk and feed it to your baby later.  °You may not get much milk when you first start to pump. Your breasts should start to make more after a few days. If you pump at the times you usually feed your baby, you may be able to keep making enough milk to feed your baby without also using formula. The more often you pump, the more milk you will produce.  °WHEN SHOULD I PUMP?  °· You can begin to pump soon after delivery. However, some experts recommend waiting about 4 weeks before giving your infant a bottle to make sure breastfeeding is going well.  °· If you plan to return to work, begin pumping a few weeks before. This will help you develop techniques that work best for you. It also lets you build up a supply of breast milk.   °· When you are with your infant, feed on demand and pump after each feeding.   °· When you are away from your infant for several hours, pump for about 15 minutes every 2-3 hours. Pump both breasts at the same time if you can.   °· If your infant has a formula feeding, make sure to pump around the same time.     °· If you drink any alcohol, wait 2 hours before pumping.   °HOW DO I PREPARE TO PUMP? °Your let-down reflex is the natural reaction to stimulation that makes your breast milk flow. It is easier to stimulate this reflex when you are relaxed. Find relaxation techniques that work for you. If you have difficulty with your let-down reflex, try these methods:  °· Smell one of your infant's blankets or an item of clothing.   °· Look at a picture or video of your infant.   °· Sit in a quiet, private space.   °· Massage the breast you plan to pump.   °· Place soothing warmth on the breast.   °· Play relaxing music.   °WHAT ARE SOME GENERAL BREAST PUMPING TIPS? °· Wash your hands before you pump. You  do not need to wash your nipples or breasts. °· There are three ways to pump. °¨ You can use your hand to massage and compress your breast. °¨ You can use a handheld manual pump. °¨ You can use an electric pump.   °· Make sure the suction cup (flange) on the breast pump is the right size. Place the flange directly over the nipple. If it is the wrong size or placed the wrong way, it may be painful and cause nipple damage.   °· If pumping is uncomfortable, apply a small amount of purified or modified lanolin to your nipple and areola. °· If you are using an electric pump, adjust the speed and suction power to be more comfortable. °· If pumping is painful or if you are not getting very much milk, you may need a different type of pump. A lactation consultant can help you determine what type of pump to use.   °· Keep a full water bottle near you at all times. Drinking lots of fluid helps you make more milk.  °· You can store your milk to use later. Pumped breast milk can be stored in a sealable, sterile container or plastic bag. Label all stored breast milk with the date you pumped it. °¨ Milk can stay out at room temperature for up to 8 hours. °¨   You can store your milk in the refrigerator for up to 8 days. °¨ You can store your milk in the freezer for 3 months. Thaw frozen milk using warm water. Do not put it in the microwave. °· Do not smoke. Smoking can lower your milk supply and harm your infant. If you need help quitting, ask your health care provider to recommend a program.   °WHEN SHOULD I CALL MY HEALTH CARE PROVIDER OR A LACTATION CONSULTANT? °· You are having trouble pumping. °· You are concerned that you are not making enough milk. °· You have nipple pain, soreness, or redness. °· You want to use birth control. Birth control pills may lower your milk supply. Talk to your health care provider about your options. °  °This information is not intended to replace advice given to you by your health care provider.  Make sure you discuss any questions you have with your health care provider. °  °Document Released: 03/21/2010 Document Revised: 10/06/2013 Document Reviewed: 07/24/2013 °Elsevier Interactive Patient Education ©2016 Elsevier Inc. ° °

## 2016-08-02 NOTE — Progress Notes (Signed)
UR chart review completed.  

## 2016-08-02 NOTE — Discharge Summary (Signed)
Obstetric Discharge Summary Reason for Admission: rupture of membranes Prenatal Procedures: ultrasound Intrapartum Procedures: spontaneous vaginal delivery Postpartum Procedures: none Complications-Operative and Postpartum: none Hemoglobin  Date Value Ref Range Status  08/01/2016 10.5 (L) 12.0 - 15.0 g/dL Final  16/10/960403/11/2015 54.012.0 g/dL Final   HCT  Date Value Ref Range Status  08/01/2016 31.2 (L) 36.0 - 46.0 % Final  12/15/2015 36 % Final   Hematocrit  Date Value Ref Range Status  05/16/2016 28.8 (L) 34.0 - 46.6 % Final    Physical Exam:  General: alert, cooperative and no distress Lochia: appropriate Uterine Fundus: firm Incision: none DVT Evaluation: No evidence of DVT seen on physical exam. No cords or calf tenderness. No significant calf/ankle edema.  Discharge Diagnoses: Term Pregnancy-delivered and Anemia  Discharge Information: Date: 08/02/2016 Activity: pelvic rest Diet: routine Medications: PNV, Ibuprofen, Colace, Iron and Percocet Condition: stable Instructions: refer to practice specific booklet Discharge to: home Follow-up Information    Neena Beecham A Bradly Sangiovanni, CNM Follow up in 2 week(s).   Specialty:  Certified Nurse Midwife Why:  postpartum exam Contact information: [address] [place] [id number] [phone]           Newborn Data: Live born female  Birth Weight: 7 lb 8.6 oz (3420 g) APGAR: 9, 9  Home with mother.  Roe Coombsachelle A Shawnie Nicole, CNM 08/02/2016, 11:50 AM

## 2016-08-07 ENCOUNTER — Encounter: Payer: Medicaid Other | Admitting: Obstetrics & Gynecology

## 2016-09-12 ENCOUNTER — Encounter: Payer: Self-pay | Admitting: Obstetrics and Gynecology

## 2016-09-12 ENCOUNTER — Ambulatory Visit (INDEPENDENT_AMBULATORY_CARE_PROVIDER_SITE_OTHER): Payer: Medicaid Other | Admitting: Obstetrics and Gynecology

## 2016-09-12 DIAGNOSIS — IMO0001 Reserved for inherently not codable concepts without codable children: Secondary | ICD-10-CM | POA: Insufficient documentation

## 2016-09-12 DIAGNOSIS — Z30013 Encounter for initial prescription of injectable contraceptive: Secondary | ICD-10-CM

## 2016-09-12 MED ORDER — MEDROXYPROGESTERONE ACETATE 150 MG/ML IM SUSP: 150.0000 mg | INTRAMUSCULAR | 0 refills | Status: DC

## 2016-09-12 NOTE — Patient Instructions (Signed)
Hormonal Contraception Information Introduction Estrogen and progesterone (progestin) are hormones used in many forms of birth control (contraception). These two hormones make up most hormonal contraceptives. Hormonal contraceptives use either:  A combination of estrogen hormone and progesterone hormone in one of these forms:  Pill. Pills come in various combinations of active hormone pills and nonhormonal pills. Different combinations of pills may give you a period once a month, once every 3 months, or no period at all. It is important to take the pills the same time each day.  Patch. The patch is placed on the lower abdomen every week for 3 weeks. On the fourth week, the patch is not placed.  Vaginal ring. The ring is placed in the vagina and left there for 3 weeks. It is then removed for 1 week.  Progesterone alone in one of these forms:  Pill. Hormone pills are taken every day of the cycle.  Intrauterine device (IUD). The IUD is inserted during a menstrual period and removed or replaced every 5 years or sooner.  Implant. Plastic rods are placed under the skin of the upper arm. They are removed or replaced every 3 years or sooner.  Injection. The injection is given once every 90 days. Pregnancy can still occur with any of these hormonal contraceptive methods. If you have any suspicion that you might be pregnant, take a pregnancy test and talk to your health care provider. Estrogen and progesterone contraceptives Estrogen and progesterone contraceptives can prevent pregnancy by:  Stopping the release of an egg (ovulation).  Thickening the mucus of the cervix, making it difficult for sperm to enter the uterus.  Changing the lining of the uterus. This change makes it more difficult for an egg to implant. Progesterone contraceptives Progesterone-only contraceptives can prevent pregnancy by:  Blocking ovulation. This occurs in many women, but some women will continue to  ovulate.  Preventing the entry of sperm into the uterus by keeping the cervical mucus thick and sticky.  Changing the lining of the uterus. This change makes it more difficult for an egg to implant. Side effects Talk to your health care provider about what side effects may affect you. If you develop persistent side effects or if the effects are severe, talk to your health care provider.  Estrogen. Side effects from estrogen occur more often in the first 2-3 months. They include:  Progesterone. Side effects of progesterone can vary. They include: Questions to ask This information is not intended to replace advice given to you by your health care provider. Make sure you discuss any questions you have with your health care provider. Document Released: 10/21/2007 Document Revised: 07/04/2016 Document Reviewed: 03/15/2013  2017 Elsevier  

## 2016-09-12 NOTE — Progress Notes (Signed)
..  Post Partum Exam  Beverly Marquez is a 25 y.o. [phone] female who presents for a postpartum visit. She is 6 weeks postpartum following a spontaneous vaginal delivery. I have fully reviewed the prenatal and intrapartum course. The delivery was at 5547w2d gestational weeks.  Anesthesia: epidural. Postpartum course has been unremarkable. Baby's course has been unremarkable. Baby is feeding by breast. Bleeding staining only. Bowel function is normal. Bladder function is normal. Patient is not sexually active. Contraception method is abstinence. Postpartum depression screening:neg  The following portions of the patient's history were reviewed and updated as appropriate: allergies, current medications, past family history, past medical history, past social history and past surgical history.  Review of Systems Pertinent items are noted in HPI.    Objective:    BP 116/78 mmHg  Pulse 78  Resp 16  Ht 5\' 5"  (1.651 m)  Wt 211 lb (95.709 kg)  BMI 35.11 kg/m2  Breastfeeding? Yes  General:  alert   Breasts:  not evaluated  Lungs: clear to auscultation bilaterally  Heart:  regular rate and rhythm, S1, S2 normal, no murmur, click, rub or gallop  Abdomen: soft, non-tender; bowel sounds normal; no masses,  no organomegaly   Vulva:  not evaluated  Vagina: not evaluated  Cervix:  not evaluated  Corpus: not examined  Adnexa:  not evaluated  Rectal Exam: Not performed.        Assessment:    Normal postpartum exam.  Plan:   1. Contraception: Depo-Provera injections Pt to return tomorrow for injection 2. Follow up in: 1 year for yearly exam and q 12 weeks for Depo Provera injections or as needed.

## 2016-09-13 ENCOUNTER — Ambulatory Visit (INDEPENDENT_AMBULATORY_CARE_PROVIDER_SITE_OTHER): Payer: Medicaid Other | Admitting: *Deleted

## 2016-09-13 VITALS — BP 130/86 | HR 63 | Wt 164.4 lb

## 2016-09-13 DIAGNOSIS — Z30013 Encounter for initial prescription of injectable contraceptive: Secondary | ICD-10-CM

## 2016-09-13 DIAGNOSIS — Z3042 Encounter for surveillance of injectable contraceptive: Secondary | ICD-10-CM | POA: Diagnosis not present

## 2016-09-13 MED ORDER — MEDROXYPROGESTERONE ACETATE 150 MG/ML IM SUSP
150.0000 mg | Freq: Once | INTRAMUSCULAR | Status: AC
  Administered 2016-09-13: 150 mg via INTRAMUSCULAR

## 2016-12-05 ENCOUNTER — Ambulatory Visit: Payer: Medicaid Other

## 2016-12-05 ENCOUNTER — Telehealth: Payer: Self-pay

## 2016-12-05 DIAGNOSIS — Z30013 Encounter for initial prescription of injectable contraceptive: Secondary | ICD-10-CM

## 2016-12-05 MED ORDER — MEDROXYPROGESTERONE ACETATE 150 MG/ML IM SUSP: 150.0000 mg | INTRAMUSCULAR | 0 refills | Status: DC

## 2016-12-05 NOTE — Telephone Encounter (Signed)
Returned call and patient stated that she needs refill for depo, advised patient that she had an appt today at 10, patient states that she thought her appt was for tomorrow. Advised that we could send 1 refill for depo with provider approval but, patient needs to schedule annual exam also, patient agreed, transferred to scheduler.

## 2016-12-12 ENCOUNTER — Ambulatory Visit (INDEPENDENT_AMBULATORY_CARE_PROVIDER_SITE_OTHER): Payer: Medicaid Other

## 2016-12-12 VITALS — BP 115/72 | HR 87 | Wt 174.4 lb

## 2016-12-12 DIAGNOSIS — Z3202 Encounter for pregnancy test, result negative: Secondary | ICD-10-CM | POA: Diagnosis not present

## 2016-12-12 DIAGNOSIS — Z3042 Encounter for surveillance of injectable contraceptive: Secondary | ICD-10-CM

## 2016-12-12 LAB — POCT URINE PREGNANCY: Preg Test, Ur: NEGATIVE

## 2016-12-12 MED ORDER — MEDROXYPROGESTERONE ACETATE 150 MG/ML IM SUSP
150.0000 mg | Freq: Once | INTRAMUSCULAR | Status: AC
  Administered 2016-12-12: 150 mg via INTRAMUSCULAR

## 2016-12-12 NOTE — Progress Notes (Signed)
Patient presents for DEPO Injection.  Shot given in Right Deltoid. UPT- NEG.   Administrations This Visit    medroxyPROGESTERone (DEPO-PROVERA) injection 150 mg    Admin Date 12/12/2016 Action Given Dose 150 mg Route Intramuscular Administered By Maretta Beesarol J Lark Runk, RMA

## 2017-03-04 ENCOUNTER — Other Ambulatory Visit: Payer: Self-pay | Admitting: Obstetrics

## 2017-03-04 ENCOUNTER — Ambulatory Visit (INDEPENDENT_AMBULATORY_CARE_PROVIDER_SITE_OTHER): Payer: Medicaid Other

## 2017-03-04 DIAGNOSIS — Z3042 Encounter for surveillance of injectable contraceptive: Secondary | ICD-10-CM | POA: Diagnosis not present

## 2017-03-04 DIAGNOSIS — Z30013 Encounter for initial prescription of injectable contraceptive: Secondary | ICD-10-CM

## 2017-03-04 MED ORDER — MEDROXYPROGESTERONE ACETATE 150 MG/ML IM SUSP
150.0000 mg | Freq: Once | INTRAMUSCULAR | Status: AC
  Administered 2017-03-04: 150 mg via INTRAMUSCULAR

## 2017-03-04 NOTE — Progress Notes (Signed)
Pt on time for inj. Depo given L deltoid w/o complaints. Pt aware next Depo due 8/11. AEX due. Pt will schedule at check out.

## 2017-05-23 ENCOUNTER — Other Ambulatory Visit: Payer: Self-pay | Admitting: Obstetrics

## 2017-05-23 ENCOUNTER — Ambulatory Visit (INDEPENDENT_AMBULATORY_CARE_PROVIDER_SITE_OTHER): Payer: Medicaid Other

## 2017-05-23 DIAGNOSIS — Z30013 Encounter for initial prescription of injectable contraceptive: Secondary | ICD-10-CM

## 2017-05-23 DIAGNOSIS — Z3042 Encounter for surveillance of injectable contraceptive: Secondary | ICD-10-CM | POA: Diagnosis not present

## 2017-05-23 MED ORDER — MEDROXYPROGESTERONE ACETATE 150 MG/ML IM SUSP
150.0000 mg | Freq: Once | INTRAMUSCULAR | Status: AC
  Administered 2017-05-23: 150 mg via INTRAMUSCULAR

## 2017-05-23 NOTE — Progress Notes (Signed)
Nurse visit for pt supply Depo given IM L del w/o difficulty. Next Depo due 10/25-11/8 pt agrees.

## 2017-06-09 ENCOUNTER — Emergency Department (HOSPITAL_COMMUNITY)
Admission: EM | Admit: 2017-06-09 | Discharge: 2017-06-09 | Discharge status: Against Medical Advice | Payer: Medicaid Other | Attending: Emergency Medicine | Admitting: Emergency Medicine

## 2017-06-09 ENCOUNTER — Encounter (HOSPITAL_COMMUNITY): Payer: Self-pay | Admitting: Emergency Medicine

## 2017-06-09 DIAGNOSIS — Z23 Encounter for immunization: Secondary | ICD-10-CM | POA: Insufficient documentation

## 2017-06-09 DIAGNOSIS — Y939 Activity, unspecified: Secondary | ICD-10-CM | POA: Diagnosis not present

## 2017-06-09 DIAGNOSIS — Z87891 Personal history of nicotine dependence: Secondary | ICD-10-CM | POA: Diagnosis not present

## 2017-06-09 DIAGNOSIS — S01312A Laceration without foreign body of left ear, initial encounter: Secondary | ICD-10-CM | POA: Diagnosis not present

## 2017-06-09 DIAGNOSIS — Y929 Unspecified place or not applicable: Secondary | ICD-10-CM | POA: Diagnosis not present

## 2017-06-09 DIAGNOSIS — Y999 Unspecified external cause status: Secondary | ICD-10-CM | POA: Diagnosis not present

## 2017-06-09 DIAGNOSIS — Z5320 Procedure and treatment not carried out because of patient's decision for unspecified reasons: Secondary | ICD-10-CM | POA: Diagnosis not present

## 2017-06-09 DIAGNOSIS — X58XXXA Exposure to other specified factors, initial encounter: Secondary | ICD-10-CM | POA: Diagnosis not present

## 2017-06-09 DIAGNOSIS — S0991XA Unspecified injury of ear, initial encounter: Secondary | ICD-10-CM | POA: Diagnosis present

## 2017-06-09 MED ORDER — TETANUS-DIPHTH-ACELL PERTUSSIS 5-2.5-18.5 LF-MCG/0.5 IM SUSP
0.5000 mL | Freq: Once | INTRAMUSCULAR | Status: AC
  Administered 2017-06-09: 0.5 mL via INTRAMUSCULAR
  Filled 2017-06-09: qty 0.5

## 2017-06-09 NOTE — ED Provider Notes (Signed)
[facility]-EMERGENCY DEPT Provider Note   CSN: [id number] Arrival date & time: 06/09/17  1816     History   Chief Complaint Chief Complaint  Patient presents with  . Ear Laceration    HPI Beverly Marquez is a 26 y.o. Female with no significant PMHx, who presents to the ED with complaints of left ear pain 5 hours. Patient refuses to disclose any information regarding what occurred that resulted in her ear hurting and having a laceration to the posterior ear, however she will at least state that her ear "was ripped" and she wants to see if she needs stitches. She states this occurred about 5 hours ago, but won't diebolt any other information regarding the incident. She denies having her head hit or impacted on anything, and denies LOC. She describes her pain as 6/10 constant throbbing nonradiating left posterior ear pain with no known aggravating or alleviating factors, no treatments tried prior to arrival. She states that the bleeding has stopped. She is unsure of her last tetanus. She denies any bruising, swelling, diminished hearing, ear drainage, CP, SOB, abdominal pain, n/v, numbness, tingling, focal weakness, or any other complaints at this time. Denies any other injury sustained. She is not on any blood thinners. She denies any medical problems or allergies to medications.   The history is provided by the patient and medical records. No language interpreter was used.  Laceration   The incident occurred 3 to 5 hours ago. The laceration is located on the scalp. The laceration is 3 cm in size. Injury mechanism: pt refuses to disclose this information. The pain is at a severity of 6/10. The pain is mild. The pain has been constant since onset. She reports no foreign bodies present. Her tetanus status is unknown.    Past Medical History:  Diagnosis Date  . No pertinent past medical history   . Trichomonas     Patient Active Problem List   Diagnosis Date Noted  . Contraception  09/12/2016    Past Surgical History:  Procedure Laterality Date  . NO PAST SURGERIES      OB History    Gravida Para Term Preterm AB Living   4 2 2   1 2    SAB TAB Ectopic Multiple Live Births   1       2       Home Medications    Prior to Admission medications   Medication Sig Start Date End Date Taking? Authorizing Provider  medroxyPROGESTERone (DEPO-PROVERA) 150 MG/ML injection inject 1 milliliter intramuscularly every 3 months 05/23/17   Brock Bad, MD  Prenatal Vit-Fe Fumarate-FA (PRENATAL MULTIVITAMIN) TABS tablet Take 1 tablet by mouth daily at 12 noon.    [provider]    Family History Family History  Problem Relation Age of Onset  . Other Neg Hx     Social History Social History  Substance Use Topics  . Smoking status: Former Smoker    Types: Cigarettes  . Smokeless tobacco: Never Used  . Alcohol use No     Allergies   Patient has no known allergies.   Review of Systems Review of Systems  HENT: Positive for ear pain. Negative for ear discharge, facial swelling (no head inj) and hearing loss.   Respiratory: Negative for shortness of breath.   Cardiovascular: Negative for chest pain.  Gastrointestinal: Negative for abdominal pain, nausea and vomiting.  Musculoskeletal: Negative for arthralgias and myalgias.  Skin: Positive for wound. Negative for color change.  Allergic/Immunologic:  Negative for immunocompromised state.  Neurological: Negative for syncope, weakness and numbness.  Hematological: Does not bruise/bleed easily.  Psychiatric/Behavioral: Negative for confusion.     Physical Exam Updated Vital Signs BP 131/76 (BP Location: Right Arm)   Pulse (!) 102   Temp 98.5 F (36.9 C) (Oral)   Resp 18   SpO2 99%   Physical Exam  Constitutional: She is oriented to person, place, and time. Vital signs are normal. She appears well-developed and well-nourished.  Non-toxic appearance. No distress.  Afebrile, nontoxic, NAD  HENT:   Head: Normocephalic. Head is with laceration (behind L ear). Head is without raccoon's eyes, without Battle's sign and without contusion.  Right Ear: Hearing, tympanic membrane, external ear and ear canal normal.  Left Ear: Hearing, tympanic membrane and ear canal normal. Left ear exhibits lacerations. No drainage, swelling or tenderness. Tympanic membrane is not injected and not perforated. No hemotympanum. No decreased hearing is noted.  Nose: Nose normal.  Mouth/Throat: Mucous membranes are normal.  R ear clear. L ear canal clear, TM clear, no perforation of TM, no hemotympanum; small fairly superficial ~3cm linear laceration to posterior auricle, at the junction of the scalp, which is well approximated and without ongoing bleeding. No retained FBs, when ear is pulled forward, wound bed does not extend deeper than superficial dermis/epidermis layers, no exposed cartilage or bone. No significant swelling or bruising.  No scalp crepitus, deformity, bony instability, or tenderness. No racoon eyes or battle's sign, no s/sx of basilar skull fx.  SEE PICTURE BELOW  Eyes: Conjunctivae and EOM are normal. Right eye exhibits no discharge. Left eye exhibits no discharge.  Neck: Normal range of motion. Neck supple.  Cardiovascular: Normal rate and intact distal pulses.   Pulmonary/Chest: Effort normal. No respiratory distress.  Abdominal: Normal appearance. She exhibits no distension.  Musculoskeletal: Normal range of motion.  Neurological: She is alert and oriented to person, place, and time. She has normal strength. No sensory deficit.  Skin: Skin is warm and dry. Laceration noted. No rash noted.  L ear laceration as mentioned above and pictured below  Psychiatric: She has a normal mood and affect. Her behavior is normal.  Nursing note and vitals reviewed.       ED Treatments / Results  Labs (all labs ordered are listed, but only abnormal results are displayed) Labs Reviewed - No data to  display  EKG  EKG Interpretation None       Radiology No results found.  Procedures Procedures (including critical care time)  Medications Ordered in ED Medications  Tdap (BOOSTRIX) injection 0.5 mL (0.5 mLs Intramuscular Given 06/09/17 1856)     Initial Impression / Assessment and Plan / ED Course  I have reviewed the triage vital signs and the nursing notes.  Pertinent labs & imaging results that were available during my care of the patient were reviewed by me and considered in my medical decision making (see chart for details).     26 y.o. female here with L posterior ear pain due to laceration, pt refuses to tell me what happened to cause the laceration just states that it was "ripped". On exam, ~3cm linear tear to posterior auricle, at the junction of the scalp to the ear, fairly superficial, no ongoing bleeding, edges approximate as long as the ear is not pulled forward, and does not extend further than the superficial layers of the dermis/epidermis. Ear canals clear, no hemotympanum or ruptured TMs. Will update Tdap, doubt need for imaging, will dermabond  it in order to help provide protection as well as help with closure, although it would likely heal well without any intervention. Will reassess after dermabond done  7:36 PM Pt eloped prior to dermabonding, did not want to wait any longer and left the ED prior to my return. She likely did not need wound repair for adequate skin closure, since the wound edges were well approximated, and the wound itself was not deep enough to require repair. Doubt she needs to be called back to return for further care. Pt eloped in stable condition.    Final Clinical Impressions(s) / ED Diagnoses   Final diagnoses:  Laceration of left ear, initial encounter    New Prescriptions New Prescriptions   No medications on [address] [place], [place] 06/09/17 1937    Tegeler, Canary Brim, MD 06/10/17 [phone]

## 2017-06-09 NOTE — ED Triage Notes (Signed)
Patient here from home with complaints of left ear laceration. When asked how she received the laceration she replied "I dont want to talk about it". Laceration behind left ear, scabbed over.

## 2017-06-09 NOTE — ED Notes (Signed)
Pt left AMA prior to being discharged. Pt did not notify staff when she was leaving, so dc vitals could not be obtained. Staff asked her to come back, but pt did not answer nor comply

## 2017-08-12 ENCOUNTER — Ambulatory Visit: Payer: Medicaid Other

## 2017-08-15 ENCOUNTER — Other Ambulatory Visit: Payer: Self-pay | Admitting: Obstetrics

## 2017-08-15 DIAGNOSIS — Z30013 Encounter for initial prescription of injectable contraceptive: Secondary | ICD-10-CM

## 2017-08-16 ENCOUNTER — Ambulatory Visit (INDEPENDENT_AMBULATORY_CARE_PROVIDER_SITE_OTHER): Payer: Medicaid Other

## 2017-08-16 DIAGNOSIS — Z3042 Encounter for surveillance of injectable contraceptive: Secondary | ICD-10-CM

## 2017-08-16 MED ORDER — MEDROXYPROGESTERONE ACETATE 150 MG/ML IM SUSP
150.0000 mg | Freq: Once | INTRAMUSCULAR | Status: AC
  Administered 2017-08-16: 150 mg via INTRAMUSCULAR

## 2017-08-16 NOTE — Progress Notes (Signed)
Agree with nursing staff's documentation of this patient's clinic encounter.  Lemon Whitacre, MD    

## 2017-08-16 NOTE — Progress Notes (Signed)
Nurse visit for pt supplied Depo. Pt is on time for inj, R Del w/o complaints.  Next Depo due Jan 18-Feb 1st pt agrees.

## 2017-11-05 ENCOUNTER — Ambulatory Visit: Payer: Medicaid Other

## 2017-11-06 ENCOUNTER — Telehealth: Payer: Self-pay | Admitting: *Deleted

## 2017-11-06 NOTE — Telephone Encounter (Signed)
Left vmail for patient to call and reschedule appt for DEPO or let us know her plans, Patient must get an annual before refills for DEPO are sent.Marland Kitchen..Marland Kitchen

## 2017-12-18 ENCOUNTER — Ambulatory Visit: Payer: Medicaid Other | Admitting: Obstetrics

## 2018-01-01 ENCOUNTER — Ambulatory Visit: Payer: Medicaid Other | Admitting: Obstetrics

## 2018-01-07 ENCOUNTER — Encounter: Payer: Self-pay | Admitting: Obstetrics

## 2018-01-07 ENCOUNTER — Ambulatory Visit (INDEPENDENT_AMBULATORY_CARE_PROVIDER_SITE_OTHER): Payer: Medicaid Other | Admitting: Obstetrics

## 2018-01-07 ENCOUNTER — Other Ambulatory Visit (HOSPITAL_COMMUNITY)
Admission: RE | Admit: 2018-01-07 | Discharge: 2018-01-07 | Disposition: A | Discharge status: Home / Self Care | Payer: Medicaid Other | Source: Ambulatory Visit | Attending: Obstetrics | Admitting: Obstetrics

## 2018-01-07 VITALS — BP 107/66 | HR 81 | Ht 65.0 in | Wt 168.2 lb

## 2018-01-07 DIAGNOSIS — Z01411 Encounter for gynecological examination (general) (routine) with abnormal findings: Secondary | ICD-10-CM | POA: Insufficient documentation

## 2018-01-07 DIAGNOSIS — N898 Other specified noninflammatory disorders of vagina: Secondary | ICD-10-CM

## 2018-01-07 DIAGNOSIS — Z3009 Encounter for other general counseling and advice on contraception: Secondary | ICD-10-CM

## 2018-01-07 DIAGNOSIS — Z01419 Encounter for gynecological examination (general) (routine) without abnormal findings: Secondary | ICD-10-CM

## 2018-01-07 DIAGNOSIS — Z0000 Encounter for general adult medical examination without abnormal findings: Secondary | ICD-10-CM | POA: Diagnosis not present

## 2018-01-07 NOTE — Progress Notes (Signed)
Patient presents for her Annual Exam today.  Last pap: 07/15/2014 WNL Hx of Abnormal Pap:no  WGN:[id number]:Patient has been on cycle x 1 month  Contraception:None   STD Screening: Declines  CC: None

## 2018-01-07 NOTE — Progress Notes (Signed)
Subjective:        Beverly Marquez is a 27 y.o. female here for a routine exam.  Current complaints: None.    Personal health questionnaire:  Is patient Ashkenazi Jewish, have a family history of breast and/or ovarian cancer: no Is there a family history of uterine cancer diagnosed at age < 5950, gastrointestinal cancer, urinary tract cancer, family member who is a Personnel officerLynch syndrome-associated carrier: no Is the patient overweight and hypertensive, family history of diabetes, personal history of gestational diabetes, preeclampsia or PCOS: no Is patient over 6155, have PCOS,  family history of premature CHD under age 27, diabetes, smoke, have hypertension or peripheral artery disease:  no At any time, has a partner hit, kicked or otherwise hurt or frightened you?: no Over the past 2 weeks, have you felt down, depressed or hopeless?: no Over the past 2 weeks, have you felt little interest or pleasure in doing things?:no   Gynecologic History Patient's last menstrual period was 12/17/2017 (approximate). Contraception: abstinence Last Pap: 2015. Results were: normal Last mammogram: n/a. Results were: n/a  Obstetric History OB History  Gravida Para Term Preterm AB Living  4 2 2   1 2   SAB TAB Ectopic Multiple Live Births  1       2    # Outcome Date GA Lbr Len/2nd Weight Sex Delivery Anes PTL Lv  4 Gravida           3 Term 11/15/11 [id number] 10:45 / 00:11 6 lb 14 oz (3.118 kg) F Vag-Spont EPI    2 Term 06/30/10    F Vag-Spont EPI N   1 SAB             Past Medical History:  Diagnosis Date  . No pertinent past medical history   . Trichomonas     Past Surgical History:  Procedure Laterality Date  . NO PAST SURGERIES       Current Outpatient Medications:  .  medroxyPROGESTERone (DEPO-PROVERA) 150 MG/ML injection, INJECT ONE MILLILITER INTRAMUSCULARLY EVERY 3 MONTHS (Patient not taking: Reported on 01/07/2018), Disp: 1 mL, Rfl: 0 .  Prenatal Vit-Fe Fumarate-FA (PRENATAL  MULTIVITAMIN) TABS tablet, Take 1 tablet by mouth daily at 12 noon., Disp: , Rfl:  No Known Allergies  Social History   Tobacco Use  . Smoking status: Former Smoker    Types: Cigarettes  . Smokeless tobacco: Never Used  Substance Use Topics  . Alcohol use: No    Family History  Problem Relation Age of Onset  . Other Neg Hx       Review of Systems  Constitutional: negative for fatigue and weight loss Respiratory: negative for cough and wheezing Cardiovascular: negative for chest pain, fatigue and palpitations Gastrointestinal: negative for abdominal pain and change in bowel habits Musculoskeletal:negative for myalgias Neurological: negative for gait problems and tremors Behavioral/Psych: negative for abusive relationship, depression Endocrine: negative for temperature intolerance    Genitourinary:negative for abnormal menstrual periods, genital lesions, hot flashes, sexual problems and vaginal discharge Integument/breast: negative for breast lump, breast tenderness, nipple discharge and skin lesion(s)    Objective:       BP 107/66   Pulse 81   Ht 5\' 5"  (1.651 m)   Wt 168 lb 3.2 oz (76.3 kg)   LMP 12/17/2017 (Approximate) Comment: 12/17/2017  BMI 27.99 kg/m  General:   alert  Skin:   no rash or abnormalities  Lungs:   clear to auscultation bilaterally  Heart:   regular rate and  rhythm, S1, S2 normal, no murmur, click, rub or gallop  Breasts:   normal without suspicious masses, skin or nipple changes or axillary nodes  Abdomen:  normal findings: no organomegaly, soft, non-tender and no hernia  Pelvis:  External genitalia: normal general appearance Urinary system: urethral meatus normal and bladder without fullness, nontender Vaginal: normal without tenderness, induration or masses Cervix: normal appearance Adnexa: normal bimanual exam Uterus: anteverted and non-tender, normal size   Lab Review Urine pregnancy test Labs reviewed yes Radiologic studies reviewed  no  50% of 20 min visit spent on counseling and coordination of care.   Assessment:     1. Encounter for routine gynecological examination with Papanicolaou smear of cervix Rx: - Cytology - PAP  2. Vaginal discharge Rx - Cervicovaginal ancillary only  3. Encounter for other general counseling and advice on contraception - declines contraception.  Not sexually active    Plan:    Education reviewed: calcium supplements, depression evaluation, low fat, low cholesterol diet, safe sex/STD prevention, self breast exams and weight bearing exercise. Contraception: abstinence. Follow up in: 1 year.   No orders of the defined types were placed in this encounter.  No orders of the defined types were placed in this encounter.   Brock Bad MD 01-07-2018

## 2018-01-08 LAB — CERVICOVAGINAL ANCILLARY ONLY
Bacterial vaginitis: POSITIVE — AB
Candida vaginitis: NEGATIVE
Chlamydia: NEGATIVE
Neisseria Gonorrhea: NEGATIVE
Trichomonas: NEGATIVE

## 2018-01-09 ENCOUNTER — Other Ambulatory Visit: Payer: Self-pay | Admitting: Obstetrics

## 2018-01-09 ENCOUNTER — Telehealth: Payer: Self-pay

## 2018-01-09 DIAGNOSIS — B9689 Other specified bacterial agents as the cause of diseases classified elsewhere: Secondary | ICD-10-CM

## 2018-01-09 DIAGNOSIS — N760 Acute vaginitis: Principal | ICD-10-CM

## 2018-01-09 LAB — CYTOLOGY - PAP: Diagnosis: NEGATIVE

## 2018-01-09 MED ORDER — SECNIDAZOLE 2 G PO PACK: 1.0000 | PACK | Freq: Once | ORAL | 2 refills | Status: AC

## 2018-01-09 NOTE — Telephone Encounter (Signed)
Advised of results and rx 

## 2018-01-10 MED ORDER — METRONIDAZOLE 500 MG PO TABS: 500.0000 mg | ORAL_TABLET | Freq: Two times a day (BID) | ORAL | 2 refills | Status: DC

## 2018-01-10 NOTE — Addendum Note (Signed)
Addended by: Coral CeoHARPER, Jenavieve Freda A on: 01/10/2018 01:02 PM   Modules accepted: Orders

## 2018-03-17 ENCOUNTER — Emergency Department (HOSPITAL_COMMUNITY)
Admission: EM | Admit: 2018-03-17 | Discharge: 2018-03-17 | Disposition: A | Discharge status: Home / Self Care | Payer: Medicaid Other | Attending: Emergency Medicine | Admitting: Emergency Medicine

## 2018-03-17 ENCOUNTER — Encounter (HOSPITAL_COMMUNITY): Payer: Self-pay | Admitting: Emergency Medicine

## 2018-03-17 DIAGNOSIS — M7918 Myalgia, other site: Secondary | ICD-10-CM | POA: Insufficient documentation

## 2018-03-17 DIAGNOSIS — J029 Acute pharyngitis, unspecified: Secondary | ICD-10-CM | POA: Diagnosis not present

## 2018-03-17 DIAGNOSIS — Z87891 Personal history of nicotine dependence: Secondary | ICD-10-CM | POA: Diagnosis not present

## 2018-03-17 DIAGNOSIS — R05 Cough: Secondary | ICD-10-CM | POA: Diagnosis not present

## 2018-03-17 DIAGNOSIS — R509 Fever, unspecified: Secondary | ICD-10-CM | POA: Insufficient documentation

## 2018-03-17 DIAGNOSIS — F1210 Cannabis abuse, uncomplicated: Secondary | ICD-10-CM | POA: Insufficient documentation

## 2018-03-17 DIAGNOSIS — R070 Pain in throat: Secondary | ICD-10-CM | POA: Diagnosis present

## 2018-03-17 MED ORDER — AMOXICILLIN 500 MG PO CAPS: 500.0000 mg | ORAL_CAPSULE | Freq: Two times a day (BID) | ORAL | 0 refills | Status: DC

## 2018-03-17 NOTE — ED Provider Notes (Signed)
[facility]-EMERGENCY DEPT Provider Note   CSN: [id number] Arrival date & time: 03/17/18  4098     History   Chief Complaint Chief Complaint  Patient presents with  . Sore Throat  . Generalized Body Aches    HPI Beverly Marquez is a 27 y.o. female.  HPI 27 year old female presents emergency department sore throat cough myalgias and fevers over the past 3 to 4 days without improvement in her symptoms.  Painful to swallow.  No difficulty breathing.  Still able to swallow foods.  Denies nausea vomiting.  No shortness of breath.  No recent sick contacts.  No rash.  Denies chest pain.  No abdominal pain.  Symptoms are moderate in severity.   Past Medical History:  Diagnosis Date  . No pertinent past medical history   . Trichomonas     Patient Active Problem List   Diagnosis Date Noted  . Contraception 09/12/2016    Past Surgical History:  Procedure Laterality Date  . NO PAST SURGERIES       OB History    Gravida  4   Para  2   Term  2   Preterm      AB  1   Living  2     SAB  1   TAB      Ectopic      Multiple      Live Births  2            Home Medications    Prior to Admission medications   Medication Sig Start Date End Date Taking? Authorizing Provider  amoxicillin (AMOXIL) 500 MG capsule Take 1 capsule (500 mg total) by mouth 2 (two) times daily. 03/17/18   Azalia Bilis, MD  Prenatal Vit-Fe Fumarate-FA (PRENATAL MULTIVITAMIN) TABS tablet Take 1 tablet by mouth daily at 12 noon.    [provider]    Family History Family History  Problem Relation Age of Onset  . Other Neg Hx     Social History Social History   Tobacco Use  . Smoking status: Former Smoker    Types: Cigarettes  . Smokeless tobacco: Never Used  Substance Use Topics  . Alcohol use: No  . Drug use: Yes    Types: Marijuana     Allergies   Patient has no known allergies.   Review of Systems Review of Systems  All other  systems reviewed and are negative.    Physical Exam Updated Vital Signs BP 125/80 (BP Location: Left Arm)   Pulse 76   Temp 98.1 F (36.7 C)   Resp (!) 22   LMP 03/03/2018   SpO2 97%   Physical Exam  Constitutional: She is oriented to person, place, and time. She appears well-developed and well-nourished.  HENT:  Head: Normocephalic.  Posterior pharyngeal erythema with bilateral tonsillar exudates.  Uvula midline.  Tongue normal size.  Tolerating secretions.  Oral airway patent.  Anterior neck is normal.  Eyes: EOM are normal.  Neck: Normal range of motion.  Pulmonary/Chest: Effort normal.  Abdominal: She exhibits no distension.  Musculoskeletal: Normal range of motion.  Neurological: She is alert and oriented to person, place, and time.  Psychiatric: She has a normal mood and affect.  Nursing note and vitals reviewed.    ED Treatments / Results  Labs (all labs ordered are listed, but only abnormal results are displayed) Labs Reviewed - No data to display  EKG None  Radiology No results found.  Procedures Procedures (  including critical care time)  Medications Ordered in ED Medications - No data to display   Initial Impression / Assessment and Plan / ED Course  I have reviewed the triage vital signs and the nursing notes.  Pertinent labs & imaging results that were available during my care of the patient were reviewed by me and considered in my medical decision making (see chart for details).     Acute pharyngitis. Home with abx to cover for strep. No PTA present. Well appearing. Nontoxic. Tolerating secretions.     Final Clinical Impressions(s) / ED Diagnoses   Final diagnoses:  Acute pharyngitis, unspecified etiology    ED Discharge Orders        Ordered    amoxicillin (AMOXIL) 500 MG capsule  2 times daily     03/17/18 [id number]       Azalia Bilisampos, Shonya Sumida, MD 03/17/18 [phone]

## 2018-03-17 NOTE — ED Triage Notes (Signed)
Pt reports sore throat, cough, body aches, chills and sweats since last Thursday.

## 2018-06-10 ENCOUNTER — Ambulatory Visit (HOSPITAL_COMMUNITY)
Admission: EM | Admit: 2018-06-10 | Discharge: 2018-06-10 | Disposition: A | Discharge status: Home / Self Care | Payer: Medicaid Other | Attending: Family Medicine | Admitting: Family Medicine

## 2018-06-10 ENCOUNTER — Encounter (HOSPITAL_COMMUNITY): Payer: Self-pay

## 2018-06-10 DIAGNOSIS — M67432 Ganglion, left wrist: Secondary | ICD-10-CM | POA: Diagnosis not present

## 2018-06-10 NOTE — ED Triage Notes (Signed)
Pt presents with bump on right wrist.

## 2018-06-11 NOTE — ED Provider Notes (Signed)
  [facility]   [id number] 06/10/18 Arrival Time: 1720  ASSESSMENT & PLAN:  1. Ganglion of left wrist    Recommended hand surgery f/u if she desires.  Follow-up Information    Schedule an appointment as soon as possible for a visit  with Dominica SeverinGramig, William, MD.   Specialty:  Orthopedic Surgery Contact information: [address] [place] [address] [phone]          Reviewed expectations re: course of current medical issues. Questions answered. Outlined signs and symptoms indicating need for more acute intervention. Patient verbalized understanding. After Visit Summary given.  SUBJECTIVE: History from: patient. Beverly Marquez is a 27 y.o. female who reports "a lump" on her dorsal R wrist. Onset: gradual, several years ago. Injury/trama: no. Occasionally "sore" but does not limit her. Associated symptoms: none reported. Extremity sensation changes or weakness: none. Self treatment: has not tried OTCs for relief of pain. Size stable.  ROS: As per HPI.   OBJECTIVE:  Vitals:   06/10/18 1821  BP: 107/61  Pulse: 60  Resp: 20  Temp: 98.2 F (36.8 C)  TempSrc: Temporal  SpO2: 99%    General appearance: alert; no distress Extremities: warm and well perfused; symmetrical with no gross deformities; 1cm ganglion cyst over right dorsal wrist; no signs of infection CV: brisk extremity capillary refill Skin: warm and dry Neurologic: normal gait; normal symmetric reflexes in all extremities; normal sensation in all extremities Psychological: alert and cooperative; normal mood and affect  No Known Allergies  Past Medical History:  Diagnosis Date  . No pertinent past medical history   . Trichomonas    Social History   Socioeconomic History  . Marital status: Single    Spouse name: Not on file  . Number of children: 2  . Years of education: Not on file  . Highest education level: Not on file  Occupational History  . Not on file   Social Needs  . Financial resource strain: Not on file  . Food insecurity:    Worry: Not on file    Inability: Not on file  . Transportation needs:    Medical: Not on file    Non-medical: Not on file  Tobacco Use  . Smoking status: Former Smoker    Types: Cigarettes  . Smokeless tobacco: Never Used  Substance and Sexual Activity  . Alcohol use: No  . Drug use: Yes    Types: Marijuana  . Sexual activity: Not Currently    Partners: Male    Birth control/protection: None, Abstinence  Lifestyle  . Physical activity:    Days per week: Not on file    Minutes per session: Not on file  . Stress: Not on file  Relationships  . Social connections:    Talks on phone: Not on file    Gets together: Not on file    Attends religious service: Not on file    Active member of club or organization: Not on file    Attends meetings of clubs or organizations: Not on file    Relationship status: Not on file  Other Topics Concern  . Not on file  Social History Narrative  . Not on file   Family History  Problem Relation Age of Onset  . Other Neg Hx    Past Surgical History:  Procedure Laterality Date  . NO PAST SURGERIES        Mardella LaymanHagler, Arif Amendola, MD 06/11/18 [phone]

## 2018-07-15 ENCOUNTER — Ambulatory Visit (INDEPENDENT_AMBULATORY_CARE_PROVIDER_SITE_OTHER): Payer: Medicaid Other | Admitting: Obstetrics

## 2018-07-15 ENCOUNTER — Encounter: Payer: Self-pay | Admitting: Obstetrics

## 2018-07-15 ENCOUNTER — Other Ambulatory Visit (HOSPITAL_COMMUNITY)
Admission: RE | Admit: 2018-07-15 | Discharge: 2018-07-15 | Disposition: A | Discharge status: Home / Self Care | Payer: Medicaid Other | Source: Ambulatory Visit | Attending: Obstetrics | Admitting: Obstetrics

## 2018-07-15 VITALS — BP 93/55 | HR 65 | Wt 151.2 lb

## 2018-07-15 DIAGNOSIS — N898 Other specified noninflammatory disorders of vagina: Secondary | ICD-10-CM

## 2018-07-15 DIAGNOSIS — Z113 Encounter for screening for infections with a predominantly sexual mode of transmission: Secondary | ICD-10-CM | POA: Diagnosis not present

## 2018-07-15 NOTE — Progress Notes (Signed)
Patient ID: GIAVANNA KANG, female   DOB: Apr 24, 1991, 27 y.o.   MRN: [id number]  No chief complaint on file.   HPI Beverly Marquez is a 27 y.o. female.  Desires STD examination. HPI  Past Medical History:  Diagnosis Date  . No pertinent past medical history   . Trichomonas     Past Surgical History:  Procedure Laterality Date  . NO PAST SURGERIES      Family History  Problem Relation Age of Onset  . Other Neg Hx     Social History Social History   Tobacco Use  . Smoking status: Former Smoker    Types: Cigarettes  . Smokeless tobacco: Never Used  Substance Use Topics  . Alcohol use: No  . Drug use: Yes    Types: Marijuana    No Known Allergies  Current Outpatient Medications  Medication Sig Dispense Refill  . amoxicillin (AMOXIL) 500 MG capsule Take 1 capsule (500 mg total) by mouth 2 (two) times daily. (Patient not taking: Reported on 07/15/2018) 14 capsule 0  . Prenatal Vit-Fe Fumarate-FA (PRENATAL MULTIVITAMIN) TABS tablet Take 1 tablet by mouth daily at 12 noon.     No current facility-administered medications for this visit.     Review of Systems Review of Systems Constitutional: negative for fatigue and weight loss Respiratory: negative for cough and wheezing Cardiovascular: negative for chest pain, fatigue and palpitations Gastrointestinal: negative for abdominal pain and change in bowel habits Genitourinary:positive for vaginal discharge Integument/breast: negative for nipple discharge Musculoskeletal:negative for myalgias Neurological: negative for gait problems and tremors Behavioral/Psych: negative for abusive relationship, depression Endocrine: negative for temperature intolerance      Blood pressure (!) 93/55, pulse 65, weight 151 lb 3.2 oz (68.6 kg).  Physical Exam Physical Exam General:   alert  Skin:   no rash or abnormalities  Lungs:   clear to auscultation bilaterally  Heart:   regular rate and rhythm, S1, S2 normal, no murmur,  click, rub or gallop  Breasts:   normal without suspicious masses, skin or nipple changes or axillary nodes  Abdomen:  normal findings: no organomegaly, soft, non-tender and no hernia  Pelvis:  External genitalia: normal general appearance Urinary system: urethral meatus normal and bladder without fullness, nontender Vaginal: normal without tenderness, induration or masses Cervix: normal appearance Adnexa: normal bimanual exam Uterus: anteverted and non-tender, normal size    50% of 15 min visit spent on counseling and coordination of care.   Data Reviewed Wet Prep Cultures  Assessment    :   1. Screening examination for STD (sexually transmitted disease) Rx: - Hepatitis C antibody - Hepatitis B surface antigen - RPR - HIV Antibody (routine testing w rflx)  2. Vaginal discharge Rx: - Cervicovaginal ancillary only  Plan    Follow up in 6 months for Annual  Orders Placed This Encounter  Procedures  . Hepatitis C antibody  . Hepatitis B surface antigen  . RPR  . HIV Antibody (routine testing w rflx)   No orders of the defined types were placed in this encounter.   Brock Bad MD 07-14-2018

## 2018-07-15 NOTE — Progress Notes (Signed)
RGYN patient presents for problem visit today . Per notes pt wants STD testing.  Denies any Sx's just wants screening.

## 2018-07-16 ENCOUNTER — Other Ambulatory Visit: Payer: Self-pay | Admitting: Obstetrics

## 2018-07-16 DIAGNOSIS — B9689 Other specified bacterial agents as the cause of diseases classified elsewhere: Secondary | ICD-10-CM

## 2018-07-16 DIAGNOSIS — N760 Acute vaginitis: Principal | ICD-10-CM

## 2018-07-16 LAB — CERVICOVAGINAL ANCILLARY ONLY
Bacterial vaginitis: POSITIVE — AB
Candida vaginitis: NEGATIVE
Chlamydia: NEGATIVE
Neisseria Gonorrhea: NEGATIVE
Trichomonas: NEGATIVE

## 2018-07-16 LAB — HEPATITIS B SURFACE ANTIGEN: HEP B S AG: NEGATIVE

## 2018-07-16 LAB — HIV ANTIBODY (ROUTINE TESTING W REFLEX): HIV Screen 4th Generation wRfx: NONREACTIVE

## 2018-07-16 LAB — RPR: RPR: NONREACTIVE

## 2018-07-16 LAB — HEPATITIS C ANTIBODY: Hep C Virus Ab: <0.1 s/co ratio (ref 0.0–0.9)

## 2018-07-16 MED ORDER — TINIDAZOLE 500 MG PO TABS: 1000.0000 mg | ORAL_TABLET | Freq: Every day | ORAL | 2 refills | Status: DC

## 2018-07-18 ENCOUNTER — Telehealth: Payer: Self-pay

## 2018-07-18 NOTE — Telephone Encounter (Signed)
Returned call, advised of results and rx sent by provider. 

## 2018-08-11 ENCOUNTER — Encounter: Payer: Self-pay | Admitting: Internal Medicine

## 2018-08-11 ENCOUNTER — Other Ambulatory Visit (HOSPITAL_COMMUNITY)
Admission: RE | Admit: 2018-08-11 | Discharge: 2018-08-11 | Disposition: A | Discharge status: Home / Self Care | Payer: Medicaid Other | Source: Ambulatory Visit | Attending: Internal Medicine | Admitting: Internal Medicine

## 2018-08-11 ENCOUNTER — Inpatient Hospital Stay (HOSPITAL_COMMUNITY)
Admission: AD | Admit: 2018-08-11 | Discharge: 2018-08-11 | Disposition: A | Discharge status: Home / Self Care | Payer: Medicaid Other | Source: Ambulatory Visit | Attending: Obstetrics and Gynecology | Admitting: Obstetrics and Gynecology

## 2018-08-11 ENCOUNTER — Other Ambulatory Visit: Payer: Self-pay

## 2018-08-11 ENCOUNTER — Ambulatory Visit (INDEPENDENT_AMBULATORY_CARE_PROVIDER_SITE_OTHER): Payer: Medicaid Other | Admitting: Internal Medicine

## 2018-08-11 VITALS — BP 109/57 | HR 68 | Ht 65.0 in | Wt 147.4 lb

## 2018-08-11 DIAGNOSIS — N898 Other specified noninflammatory disorders of vagina: Secondary | ICD-10-CM

## 2018-08-11 DIAGNOSIS — Z87891 Personal history of nicotine dependence: Secondary | ICD-10-CM | POA: Diagnosis not present

## 2018-08-11 NOTE — Progress Notes (Signed)
   Subjective:    Kyrielle ROSALIN BUSTER - 27 y.o. female MRN [id number]  Date of birth: 03/25/1991  HPI  Shanaye Kathie Rhodes Schindler is a 27 y.o. [phone] female here for vaginal irritation after intercourse. She used latex condoms and also received oral sex. She is concerned for a possible infection. Denies vaginal discharge. She does note that she recently changed soaps.     OB History    Gravida  4   Para  2   Term  2   Preterm      AB  1   Living  2     SAB  1   TAB      Ectopic      Multiple      Live Births  2           -  reports that she has quit smoking. Her smoking use included cigarettes. She has never used smokeless tobacco. - Review of Systems: Per HPI. - Past Medical History: Patient Active Problem List   Diagnosis Date Noted  . Contraception 09/12/2016   - Medications: reviewed and updated   Objective:   Physical Exam BP (!) 109/57   Pulse 68   Ht 5\' 5"  (1.651 m)   Wt 147 lb 6.4 oz (66.9 kg)   LMP 07/28/2018   BMI 24.53 kg/m  Gen: NAD, alert, cooperative with exam, well-appearing GU/GYN: Exam performed in the presence of a chaperone. External genitalia within normal limits without erythema, edema, or skin abnormalities.  Vaginal mucosa pink, moist, normal rugae.  Nonfriable cervix without lesions, no discharge or bleeding noted on speculum exam.  Bimanual exam revealed normal, nongravid uterus.  No cervical motion tenderness. No adnexal masses bilaterally.        Assessment & Plan:   1. Vaginal irritation Discussed with patient that she does not have any signs of latex allergy as external genitalia within normal limits. Discussed the delicate pH balance of vagina and potential for sensitivity with changing soaps, detergents etc. Will check cultures.  - Cervicovaginal ancillary only  Routine preventative health maintenance measures emphasized. Please refer to After Visit Summary for other counseling recommendations.   Return if symptoms worsen or  fail to improve.  Marcy Siren, D.O. OB Fellow  08/16/2018, 4:38 PM

## 2018-08-11 NOTE — MAU Note (Signed)
Had sex recently, "doesn't know if she was allergic to the condom or something happened from him licking down there, but something is wrong". Vagina and area are irritated.  Yesterday felt raw, but is not painful. Vaginal itching and d/c.

## 2018-08-11 NOTE — Patient Instructions (Signed)
We have sent a swab testing for different types of infections. We will send you a message with the results. The irritation may be related to changes in soaps, detergents etc.

## 2018-08-11 NOTE — MAU Provider Note (Signed)
Chief Complaint: Vaginal Discharge; Vaginal Itching; and vaginal irritation   First Provider Initiated Contact with Patient 08/11/18 1857      SUBJECTIVE HPI: Beverly Marquez is a 27 y.o. [id number] who presents to maternity admissions for vaginal irritation after intercourse. She denies any pain, vaginal bleeding or discharge.     Past Medical History:  Diagnosis Date  . No pertinent past medical history   . Trichomonas    Past Surgical History:  Procedure Laterality Date  . NO PAST SURGERIES     Social History   Socioeconomic History  . Marital status: Single    Spouse name: Not on file  . Number of children: 2  . Years of education: Not on file  . Highest education level: Not on file  Occupational History  . Not on file  Social Needs  . Financial resource strain: Not on file  . Food insecurity:    Worry: Not on file    Inability: Not on file  . Transportation needs:    Medical: Not on file    Non-medical: Not on file  Tobacco Use  . Smoking status: Former Smoker    Types: Cigarettes  . Smokeless tobacco: Never Used  Substance and Sexual Activity  . Alcohol use: No  . Drug use: Yes    Types: Marijuana  . Sexual activity: Not Currently    Partners: Male    Birth control/protection: None, Abstinence  Lifestyle  . Physical activity:    Days per week: Not on file    Minutes per session: Not on file  . Stress: Not on file  Relationships  . Social connections:    Talks on phone: Not on file    Gets together: Not on file    Attends religious service: Not on file    Active member of club or organization: Not on file    Attends meetings of clubs or organizations: Not on file    Relationship status: Not on file  . Intimate partner violence:    Fear of current or ex partner: Not on file    Emotionally abused: Not on file    Physically abused: Not on file    Forced sexual activity: Not on file  Other Topics Concern  . Not on file  Social History Narrative   . Not on file   No current facility-administered medications on file prior to encounter.    Current Outpatient Medications on File Prior to Encounter  Medication Sig Dispense Refill  . amoxicillin (AMOXIL) 500 MG capsule Take 1 capsule (500 mg total) by mouth 2 (two) times daily. (Patient not taking: Reported on 07/15/2018) 14 capsule 0  . Prenatal Vit-Fe Fumarate-FA (PRENATAL MULTIVITAMIN) TABS tablet Take 1 tablet by mouth daily at 12 noon.    . tinidazole (TINDAMAX) 500 MG tablet Take 2 tablets (1,000 mg total) by mouth daily with breakfast. 10 tablet 2   No Known Allergies  ROS:  Review of Systems  Constitutional: Negative.  Negative for fatigue and fever.  HENT: Negative.   Respiratory: Negative.  Negative for shortness of breath.   Cardiovascular: Negative.  Negative for chest pain.  Gastrointestinal: Negative.  Negative for abdominal pain, constipation, diarrhea, nausea and vomiting.  Genitourinary: Negative.  Negative for dysuria, vaginal bleeding and vaginal discharge.  Neurological: Negative.  Negative for dizziness and headaches.    I have reviewed patient's Past Medical Hx, Surgical Hx, Family Hx, Social Hx, medications and allergies.   Physical Exam   Patient Vitals  for the past 24 hrs:  BP Temp Temp src Pulse Resp SpO2 Weight  08/11/18 1855 (!) 117/57 98.4 F (36.9 C) Oral 87 18 96 % 67.1 kg    MDM Patient denies any concerning symptoms in need of emergent evaluation. Patient advised that she may wait for a room in MAU or may choose to be discharged to seek non-emergent evaluation of her complaint at [facility] at the after hours clinic now, Urgent care or her PCP.   ASSESSMENT MSE Complete  PLAN Discharge patient at her request to seek non-emergent medical care elsewhere  Rennie Plowman 08/11/2018 6:58 PM

## 2018-08-13 LAB — CERVICOVAGINAL ANCILLARY ONLY
Bacterial vaginitis: NEGATIVE
CHLAMYDIA, DNA PROBE: NEGATIVE
Candida vaginitis: POSITIVE — AB
NEISSERIA GONORRHEA: NEGATIVE
TRICH (WINDOWPATH): NEGATIVE

## 2018-08-16 ENCOUNTER — Other Ambulatory Visit: Payer: Self-pay | Admitting: Internal Medicine

## 2018-08-16 MED ORDER — FLUCONAZOLE 150 MG PO TABS: ORAL_TABLET | ORAL | 0 refills | Status: DC

## 2018-08-16 NOTE — Progress Notes (Signed)
Diflucan sent for yeast infection on culture.   Marcy Siren, D.O. [facility] Family Medicine Fellow, [facility], [facility] 08/16/2018, 4:42 PM

## 2018-08-21 ENCOUNTER — Ambulatory Visit: Payer: Medicaid Other | Admitting: Obstetrics

## 2018-08-21 ENCOUNTER — Encounter: Payer: Self-pay | Admitting: Obstetrics

## 2018-08-21 VITALS — BP 107/70 | HR 73 | Ht 65.0 in | Wt 147.4 lb

## 2018-08-21 DIAGNOSIS — Z3202 Encounter for pregnancy test, result negative: Secondary | ICD-10-CM | POA: Diagnosis not present

## 2018-08-21 DIAGNOSIS — Z30011 Encounter for initial prescription of contraceptive pills: Secondary | ICD-10-CM | POA: Diagnosis not present

## 2018-08-21 DIAGNOSIS — Z3009 Encounter for other general counseling and advice on contraception: Secondary | ICD-10-CM | POA: Diagnosis not present

## 2018-08-21 LAB — POCT URINE PREGNANCY: Preg Test, Ur: NEGATIVE

## 2018-08-21 MED ORDER — LEVONORGEST-ETH ESTRADIOL-IRON 0.1-20 MG-MCG(21) PO TABS: 1.0000 | ORAL_TABLET | Freq: Every day | ORAL | 11 refills | Status: DC

## 2018-08-21 NOTE — Progress Notes (Signed)
Subjective:    Beverly Marquez is a 27 y.o. female who presents for contraception counseling. The patient has no complaints today. The patient is sexually active. Pertinent past medical history: none.  The information documented in the HPI was reviewed and verified.  Menstrual History: OB History    Gravida  4   Para  2   Term  2   Preterm      AB  1   Living  3     SAB  1   TAB      Ectopic      Multiple      Live Births  3            Patient's last menstrual period was 07/28/2018.   Patient Active Problem List   Diagnosis Date Noted  . Contraception 09/12/2016   Past Medical History:  Diagnosis Date  . No pertinent past medical history   . Trichomonas     Past Surgical History:  Procedure Laterality Date  . NO PAST SURGERIES       Current Outpatient Medications:  .  Levonorgest-Eth Estrad-Fe Bisg (BALCOLTRA) 0.1-20 MG-MCG(21) TABS, Take 1 tablet by mouth daily., Disp: 28 tablet, Rfl: 11 No Known Allergies  Social History   Tobacco Use  . Smoking status: Former Smoker    Types: Cigarettes  . Smokeless tobacco: Never Used  Substance Use Topics  . Alcohol use: No    Family History  Problem Relation Age of Onset  . Other Neg Hx        Review of Systems Constitutional: negative for weight loss Genitourinary:negative for abnormal menstrual periods and vaginal discharge   Objective:   BP 107/70   Pulse 73   Ht 5\' 5"  (1.651 m)   Wt 147 lb 6.4 oz (66.9 kg)   LMP 07/28/2018   BMI 24.53 kg/m    General:   alert  Skin:   no rash or abnormalities  Lungs:   clear to auscultation bilaterally  Heart:   regular rate and rhythm, S1, S2 normal, no murmur, click, rub or gallop  Breasts:   normal without suspicious masses, skin or nipple changes or axillary nodes  Abdomen:  normal findings: no organomegaly, soft, non-tender and no hernia  Pelvis:  External genitalia: normal general appearance Urinary system: urethral meatus normal and  bladder without fullness, nontender Vaginal: normal without tenderness, induration or masses Cervix: normal appearance Adnexa: normal bimanual exam Uterus: anteverted and non-tender, normal size   Lab Review Urine pregnancy test Labs reviewed yes Radiologic studies reviewed no  >50% of 15 min visit spent on counseling and coordination of care.    Assessment:    27 y.o., starting OCP (estrogen/progesterone), no contraindications.   Plan:   1. Encounter for other general counseling and advice on contraception - wants OCP's  2. Encounter for initial prescription of contraceptive pills Rx: - POCT urine pregnancy - Levonorgest-Eth Estrad-Fe Bisg (BALCOLTRA) 0.1-20 MG-MCG(21) TABS; Take 1 tablet by mouth daily.  Dispense: 28 tablet; Refill: 11    All questions answered. Contraception: OCP (estrogen/progesterone). Discussed healthy lifestyle modifications. Follow up in 6 months. Pregnancy test, result: negative.    Meds ordered this encounter  Medications  . Levonorgest-Eth Estrad-Fe Bisg (BALCOLTRA) 0.1-20 MG-MCG(21) TABS    Sig: Take 1 tablet by mouth daily.    Dispense:  28 tablet    Refill:  11   Orders Placed This Encounter  Procedures  . POCT urine pregnancy    Travon Crochet A.  Jesenia Spera MD 08-21-2018

## 2018-08-21 NOTE — Progress Notes (Signed)
Pt presents for birth control consult. Pt states that she does prefer a nexplanon or iud. She is leaning towards starting the pill.

## 2018-09-24 ENCOUNTER — Ambulatory Visit: Payer: Medicaid Other | Admitting: Obstetrics

## 2018-10-16 ENCOUNTER — Ambulatory Visit: Payer: Medicaid Other | Admitting: Obstetrics

## 2018-11-30 ENCOUNTER — Ambulatory Visit (HOSPITAL_COMMUNITY)
Admission: EM | Admit: 2018-11-30 | Discharge: 2018-11-30 | Disposition: A | Discharge status: Home / Self Care | Payer: Medicaid Other | Attending: Family Medicine | Admitting: Family Medicine

## 2018-11-30 ENCOUNTER — Encounter (HOSPITAL_COMMUNITY): Payer: Self-pay | Admitting: Emergency Medicine

## 2018-11-30 DIAGNOSIS — M67431 Ganglion, right wrist: Secondary | ICD-10-CM

## 2018-11-30 NOTE — ED Triage Notes (Signed)
Pt states "I have a knot on my right wrist for 6 years and its not always been this big and I dont have a primary so I need a referral."

## 2018-11-30 NOTE — ED Provider Notes (Signed)
[facility]    CSN: [id number] Arrival date & time: 11/30/18  1101     History   Chief Complaint No chief complaint on file.   HPI Beverly Marquez is a 28 y.o. female.   HPI  Patient is here for a ganglion of her right wrist.  She is been seen for this previously in August 2019.  She states that at times is more swollen and painful.  She is here requesting referral to have this removed.  I explained to her that under her insurance she has to see her primary care doctor, and have that doctor place a referral in order for it to be covered.  She therefore asks for a primary care doctor.  No other complaints  Past Medical History:  Diagnosis Date  . No pertinent past medical history   . Trichomonas     Patient Active Problem List   Diagnosis Date Noted  . Contraception 09/12/2016    Past Surgical History:  Procedure Laterality Date  . NO PAST SURGERIES      OB History    Gravida  4   Para  2   Term  2   Preterm      AB  1   Living  3     SAB  1   TAB      Ectopic      Multiple      Live Births  3            Home Medications    Prior to Admission medications   Medication Sig Start Date End Date Taking? Authorizing Provider  Levonorgest-Eth Estrad-Fe Bisg (BALCOLTRA) 0.1-20 MG-MCG(21) TABS Take 1 tablet by mouth daily. 08/21/18   Brock Bad, MD    Family History Family History  Problem Relation Age of Onset  . Other Neg Hx     Social History Social History   Tobacco Use  . Smoking status: Former Smoker    Types: Cigarettes  . Smokeless tobacco: Never Used  Substance Use Topics  . Alcohol use: No  . Drug use: Yes    Types: Marijuana     Allergies   Patient has no known allergies.   Review of Systems Review of Systems  Constitutional: Negative for chills and fever.  HENT: Negative for ear pain and sore throat.   Eyes: Negative for pain and visual disturbance.  Respiratory: Negative for cough and  shortness of breath.   Cardiovascular: Negative for chest pain and palpitations.  Gastrointestinal: Negative for abdominal pain and vomiting.  Genitourinary: Negative for dysuria and hematuria.  Musculoskeletal: Positive for arthralgias. Negative for back pain.  Skin: Negative for color change and rash.  Neurological: Negative for seizures and syncope.  All other systems reviewed and are negative.    Physical Exam Triage Vital Signs ED Triage Vitals  Enc Vitals Group     BP 11/30/18 1218 (!) 115/56     Pulse Rate 11/30/18 1217 73     Resp 11/30/18 1217 18     Temp 11/30/18 1217 97.8 F (36.6 C)     Temp src --      SpO2 11/30/18 1217 98 %     Weight --      Height --      Head Circumference --      Peak Flow --      Pain Score 11/30/18 1217 8     Pain Loc --      Pain  Edu? --      Excl. in GC? --    No data found.  Updated Vital Signs BP (!) 115/56   Pulse 73   Temp 97.8 F (36.6 C)   Resp 18   LMP 09/30/2018   SpO2 98%   Visual Acuity Right Eye Distance:   Left Eye Distance:   Bilateral Distance:    Right Eye Near:   Left Eye Near:    Bilateral Near:     Physical Exam Constitutional:      General: She is not in acute distress.    Appearance: She is well-developed.  HENT:     Head: Normocephalic and atraumatic.  Eyes:     Conjunctiva/sclera: Conjunctivae normal.     Pupils: Pupils are equal, round, and reactive to light.  Neck:     Musculoskeletal: Normal range of motion.  Cardiovascular:     Rate and Rhythm: Normal rate.  Pulmonary:     Effort: Pulmonary effort is normal. No respiratory distress.  Abdominal:     General: There is no distension.     Palpations: Abdomen is soft.  Musculoskeletal: Normal range of motion.       Hands:  Skin:    General: Skin is warm and dry.  Neurological:     Mental Status: She is alert.      UC Treatments / Results  Labs (all labs ordered are listed, but only abnormal results are displayed) Labs  Reviewed - No data to display  EKG None  Radiology No results found.  Procedures Procedures (including critical care time)  Medications Ordered in UC Medications - No data to display  Initial Impression / Assessment and Plan / UC Course  I have reviewed the triage vital signs and the nursing notes.  Pertinent labs & imaging results that were available during my care of the patient were reviewed by me and considered in my medical decision making (see chart for details).      Final Clinical Impressions(s) / UC Diagnoses   Final diagnoses:  Ganglion, right wrist     Discharge Instructions     Call to be seen at primary care They can refer you to orthopedic for hand problem   ED Prescriptions    None     Controlled Substance Prescriptions [place] Controlled Substance Registry consulted? Not Applicable   Eustace Moore, MD 11/30/18 2109

## 2018-11-30 NOTE — Discharge Instructions (Addendum)
Call to be seen at primary care They can refer you to orthopedic for hand problem

## 2018-12-19 ENCOUNTER — Encounter: Payer: Self-pay | Admitting: Family Medicine

## 2018-12-23 ENCOUNTER — Encounter: Payer: Self-pay | Admitting: Family Medicine

## 2018-12-23 ENCOUNTER — Ambulatory Visit (INDEPENDENT_AMBULATORY_CARE_PROVIDER_SITE_OTHER): Payer: Medicaid Other | Admitting: Family Medicine

## 2018-12-23 VITALS — BP 124/73 | HR 68 | Resp 17 | Ht 65.0 in | Wt 152.0 lb

## 2018-12-23 DIAGNOSIS — Z7689 Persons encountering health services in other specified circumstances: Secondary | ICD-10-CM

## 2018-12-23 DIAGNOSIS — M67441 Ganglion, right hand: Secondary | ICD-10-CM

## 2018-12-23 DIAGNOSIS — M79641 Pain in right hand: Secondary | ICD-10-CM | POA: Diagnosis not present

## 2018-12-23 NOTE — Progress Notes (Signed)
Beverly Marquez, is a 28 y.o. female  IRJ:[id number]  TKZ:[id number]  DOB - [birth year]  CC:  Chief Complaint  Patient presents with  . Establish Care  . Referral       HPI: Beverly Marquez is a 28 y.o. female is here today to establish care.   Beverly Marquez has Contraception on their problem list.   Patient complains of right hand pain. She has a history of a ganglion cyst which is affixed anterior wrist. She endorses that the cysts often swells and abruptly decreases in size. At present, this flare has been present for greater than one month. She characterizes pain as unpredictable, sharp and extends to distal digits of her right hand. She denies weakness. She has only attempted relief with rest and no prior wrist braces.   Current medications:  Current Outpatient Medications:  .  Levonorgest-Eth Estrad-Fe Bisg (BALCOLTRA) 0.1-20 MG-MCG(21) TABS, Take 1 tablet by mouth daily., Disp: 28 tablet, Rfl: 11   Pertinent family medical history: family history is not on file.   No Known Allergies  Social History   Socioeconomic History  . Marital status: Single    Spouse name: Not on file  . Number of children: 2  . Years of education: Not on file  . Highest education level: Not on file  Occupational History  . Not on file  Social Needs  . Financial resource strain: Not on file  . Food insecurity:    Worry: Not on file    Inability: Not on file  . Transportation needs:    Medical: Not on file    Non-medical: Not on file  Tobacco Use  . Smoking status: Former Smoker    Types: Cigarettes  . Smokeless tobacco: Never Used  Substance and Sexual Activity  . Alcohol use: No  . Drug use: Yes    Types: Marijuana  . Sexual activity: Yes    Partners: Male    Birth control/protection: OCP  Lifestyle  . Physical activity:    Days per week: Not on file    Minutes per session: Not on file  . Stress: Not on file  Relationships  . Social connections:    Talks on phone: Not on  file    Gets together: Not on file    Attends religious service: Not on file    Active member of club or organization: Not on file    Attends meetings of clubs or organizations: Not on file    Relationship status: Not on file  . Intimate partner violence:    Fear of current or ex partner: Not on file    Emotionally abused: Not on file    Physically abused: Not on file    Forced sexual activity: Not on file  Other Topics Concern  . Not on file  Social History Narrative  . Not on file    Review of Systems: Pertinent negatives listed in HPI Objective:   Vitals:   12/23/18 1417  BP: 124/73  Pulse: 68  Resp: 17  SpO2: 99%    BP Readings from Last 3 Encounters:  12/23/18 124/73  11/30/18 (!) 115/56  08/21/18 107/70    Filed Weights   12/23/18 1417  Weight: 152 lb (68.9 kg)      Physical Exam: General appearance: alert, well developed, well nourished, cooperative and in no distress Head: Normocephalic, without obvious abnormality, atraumatic Respiratory: Respirations even and unlabored, normal respiratory rate Heart: rate and rhythm normal. No gallop or murmurs noted on exam  Abdomen: BS +, no distention, no rebound tenderness, or no mass Extremities: Nodular localized cyst affix right wrist Skin: Skin color, texture, turgor normal. No rashes seen  Psych: Appropriate mood and affect. Neurologic: Mental status: Alert, oriented to person, place, and time, thought content appropriate.   Lab Results (prior encounters)  Lab Results  Component Value Date   WBC 9.5 08/01/2016   HGB 10.5 (L) 08/01/2016   HCT 31.2 (L) 08/01/2016   MCV 97.8 08/01/2016   PLT 350 08/01/2016      Assessment and plan:  1. Encounter to establish care -Encouraged to follow-up   2. Ganglion cyst of joint of finger of right hand - Ambulatory referral to Orthopedic Surgery   A total of 25 minutes spent, greater than 50 % of this time was spent counseling and coordination of care.   The  patient was given clear instructions to go to ER or return to medical center if symptoms don't improve, worsen or new problems develop. The patient verbalized understanding. The patient was advised  to call and obtain lab results if they haven't heard anything from out office within 7-10 business days.  Joaquin Courts, FNP Primary Care at [facility] [address], [place] [address] [phone]: [phone]    This note has been created with Dragon speech recognition software and Paediatric nurse. Any transcriptional errors are unintentional.

## 2018-12-23 NOTE — Patient Instructions (Addendum)
Thank you for choosing Primary Care at [facility] to be your medical home!    Beverly Marquez was seen by Joaquin Courts, FNP today.   Beverly Marquez's primary care provider is Bing Neighbors, FNP.   For the best care possible, you should try to see Joaquin Courts, FNP-C whenever you come to the clinic.   We look forward to seeing you again soon!  If you have any questions about your visit today, please call us at [phone] or feel free to reach your primary care provider via MyChart.       Ganglion Cyst  A ganglion cyst is a non-cancerous, fluid-filled lump that occurs near a joint or tendon. The cyst grows out of a joint or the lining of a tendon. Ganglion cysts most often develop in the hand or wrist, but they can also develop in the shoulder, elbow, hip, knee, ankle, or foot. Ganglion cysts are ball-shaped or egg-shaped. Their size can range from the size of a pea to larger than a grape. Increased activity may cause the cyst to get bigger because more fluid starts to build up. What are the causes? The exact cause of this condition is not known, but it may be related to:  Inflammation or irritation around the joint.  An injury.  Repetitive movements or overuse.  Arthritis. What increases the risk? You are more likely to develop this condition if:  You are a woman.  You are 17-68 years old. What are the signs or symptoms? The main symptom of this condition is a lump. It most often appears on the hand or wrist. In many cases, there are no other symptoms, but a cyst can sometimes cause:  Tingling.  Pain.  Numbness.  Muscle weakness.  Weak grip.  Less range of motion in a joint. How is this diagnosed? Ganglion cysts are usually diagnosed based on a physical exam. Your health care provider will feel the lump and may shine a light next to it. If it is a ganglion cyst, the light will likely shine through it. Your health care provider may order an  X-ray, ultrasound, or MRI to rule out other conditions. How is this treated? Ganglion cysts often go away on their own without treatment. If you have pain or other symptoms, treatment may be needed. Treatment is also needed if the ganglion cyst limits your movement or if it gets infected. Treatment may include:  Wearing a brace or splint on your wrist or finger.  Taking anti-inflammatory medicine.  Having fluid drained from the lump with a needle (aspiration).  Getting a steroid injected into the joint.  Having surgery to remove the ganglion cyst.  Placing a pad on your shoe or wearing shoes that will not rub against the cyst if it is on your foot. Follow these instructions at home:  Do not press on the ganglion cyst, poke it with a needle, or hit it.  Take over-the-counter and prescription medicines only as told by your health care provider.  If you have a brace or splint: ? Wear it as told by your health care provider. ? Remove it as told by your health care provider. Ask if you need to remove it when you take a shower or a bath.  Watch your ganglion cyst for any changes.  Keep all follow-up visits as told by your health care provider. This is important. Contact a health care provider if:  Your ganglion cyst becomes larger or more painful.  You  have pus coming from the lump.  You have weakness or numbness in the affected area.  You have a fever or chills. Get help right away if:  You have a fever and have any of these in the cyst area: ? Increased redness. ? Red streaks. ? Swelling. Summary  A ganglion cyst is a non-cancerous, fluid-filled lump that occurs near a joint or tendon.  Ganglion cysts most often develop in the hand or wrist, but they can also develop in the shoulder, elbow, hip, knee, ankle, or foot.  Ganglion cysts often go away on their own without treatment. This information is not intended to replace advice given to you by your health care provider.  Make sure you discuss any questions you have with your health care provider. Document Released: 09/28/2000 Document Revised: 05/31/2017 Document Reviewed: 05/31/2017 Elsevier Interactive Patient Education  2019 [employer].

## 2019-01-05 ENCOUNTER — Telehealth (INDEPENDENT_AMBULATORY_CARE_PROVIDER_SITE_OTHER): Payer: Self-pay

## 2019-01-05 NOTE — Telephone Encounter (Signed)
Called patient and asked the screening questions.  Do you have now or have you had in the past 7 days a fever and/or chills? NO  Do you have now or have you had in the past 7 days a cough? NO  Do you have now or have you had in the last 7 days nausea, vomiting or abdominal pain? NO  Have you been exposed to anyone who has tested positive for COVID-19? NO  Have you or anyone who lives with you traveled within the last month? NO 

## 2019-01-06 ENCOUNTER — Ambulatory Visit (INDEPENDENT_AMBULATORY_CARE_PROVIDER_SITE_OTHER): Payer: Self-pay | Admitting: Orthopaedic Surgery

## 2019-02-17 ENCOUNTER — Other Ambulatory Visit: Payer: Self-pay

## 2019-02-17 ENCOUNTER — Ambulatory Visit (INDEPENDENT_AMBULATORY_CARE_PROVIDER_SITE_OTHER): Payer: Medicaid Other | Admitting: Orthopaedic Surgery

## 2019-02-17 ENCOUNTER — Ambulatory Visit (INDEPENDENT_AMBULATORY_CARE_PROVIDER_SITE_OTHER): Payer: Medicaid Other

## 2019-02-17 ENCOUNTER — Ambulatory Visit (INDEPENDENT_AMBULATORY_CARE_PROVIDER_SITE_OTHER): Payer: Self-pay | Admitting: Orthopaedic Surgery

## 2019-02-17 ENCOUNTER — Encounter: Payer: Self-pay | Admitting: Orthopaedic Surgery

## 2019-02-17 DIAGNOSIS — M79641 Pain in right hand: Secondary | ICD-10-CM

## 2019-02-17 DIAGNOSIS — M6740 Ganglion, unspecified site: Secondary | ICD-10-CM

## 2019-02-17 NOTE — Progress Notes (Signed)
Office Visit Note   Patient: Beverly Marquez           Date of Birth: [birth year]           MRN: [id number] Visit Date: 02/17/2019              Requested by: Bing Neighbors, FNP [address] [place], [place] [address] PCP: Bing Neighbors, FNP   Assessment & Plan: Visit Diagnoses:  1. Pain in right hand   2. Ganglion cyst     Plan: Impression is right dorsal wrist ganglion cyst.  Overall findings and features are fairly classic for this.  Treatment options including needle aspiration versus living with it versus surgical removal were all discussed along with associated risks and benefits and likelihood of recurrence and resolution.  Patient will take time to think about this.  It sounds like at this point it is not overly bothersome.  We will see her back as needed.  Follow-Up Instructions: Return if symptoms worsen or fail to improve.   Orders:  Orders Placed This Encounter  Procedures  . XR Hand Complete Right   No orders of the defined types were placed in this encounter.     Procedures: No procedures performed   Clinical Data: No additional findings.   Subjective: Chief Complaint  Patient presents with  . Right Hand - Pain, Cyst    Beverly Marquez is a 28 year old female comes in with a dorsal right wrist ganglion cyst that she has had for several years.  It does tend to fluctuate in size is often associated with use of her right hand and wrist.  She states that it causes some pain at times but overall it does not bother her to a significant degree.  She does not like its appearance.  She is mainly here to discuss treatment options.  Denies any constitutional symptoms any history of cancer.   Review of Systems  Constitutional: Negative.   HENT: Negative.   Eyes: Negative.   Respiratory: Negative.   Cardiovascular: Negative.   Endocrine: Negative.   Musculoskeletal: Negative.   Neurological: Negative.   Hematological: Negative.    Psychiatric/Behavioral: Negative.   All other systems reviewed and are negative.    Objective: Vital Signs: There were no vitals taken for this visit.  Physical Exam Vitals signs and nursing note reviewed.  Constitutional:      Appearance: She is well-developed.  HENT:     Head: Normocephalic and atraumatic.  Neck:     Musculoskeletal: Neck supple.  Pulmonary:     Effort: Pulmonary effort is normal.  Abdominal:     Palpations: Abdomen is soft.  Skin:    General: Skin is warm.     Capillary Refill: Capillary refill takes less than 2 seconds.  Neurological:     Mental Status: She is alert and oriented to person, place, and time.  Psychiatric:        Behavior: Behavior normal.        Thought Content: Thought content normal.        Judgment: Judgment normal.     Ortho Exam Right hand exam shows a palpable dorsal mass overlying the extensor tendons.  This is firm and semi-mobile.  No aggressive features.  Transilluminates with light. Specialty Comments:  No specialty comments available.  Imaging: Xr Hand Complete Right  Result Date: 02/17/2019 No acute or structural abnormalities.    PMFS History: Patient Active Problem List   Diagnosis Date Noted  .  Contraception 09/12/2016   Past Medical History:  Diagnosis Date  . No pertinent past medical history   . Trichomonas     History reviewed. No pertinent family history.  Past Surgical History:  Procedure Laterality Date  . NO PAST SURGERIES     Social History   Occupational History  . Not on file  Tobacco Use  . Smoking status: Former Smoker    Types: Cigarettes  . Smokeless tobacco: Never Used  Substance and Sexual Activity  . Alcohol use: No  . Drug use: Yes    Types: Marijuana  . Sexual activity: Yes    Partners: Male    Birth control/protection: OCP

## 2019-04-21 ENCOUNTER — Ambulatory Visit (HOSPITAL_COMMUNITY)
Admission: EM | Admit: 2019-04-21 | Discharge: 2019-04-21 | Disposition: A | Discharge status: Home / Self Care | Payer: Medicaid Other | Attending: Emergency Medicine | Admitting: Emergency Medicine

## 2019-04-21 ENCOUNTER — Encounter (HOSPITAL_COMMUNITY): Payer: Self-pay

## 2019-04-21 ENCOUNTER — Other Ambulatory Visit: Payer: Self-pay

## 2019-04-21 DIAGNOSIS — N898 Other specified noninflammatory disorders of vagina: Secondary | ICD-10-CM

## 2019-04-21 LAB — POCT URINALYSIS DIP (DEVICE)
Bilirubin Urine: NEGATIVE
Glucose, UA: NEGATIVE mg/dL
Hgb urine dipstick: NEGATIVE
Ketones, ur: NEGATIVE mg/dL
Leukocytes,Ua: NEGATIVE
Nitrite: NEGATIVE
Protein, ur: NEGATIVE mg/dL
Specific Gravity, Urine: 1.025 (ref 1.005–1.030)
Urobilinogen, UA: 0.2 mg/dL (ref 0.0–1.0)
pH: 7.5 (ref 5.0–8.0)

## 2019-04-21 LAB — POCT PREGNANCY, URINE: Preg Test, Ur: NEGATIVE

## 2019-04-21 MED ORDER — METRONIDAZOLE 500 MG PO TABS: 500.0000 mg | ORAL_TABLET | Freq: Two times a day (BID) | ORAL | 0 refills | Status: AC

## 2019-04-21 NOTE — ED Triage Notes (Signed)
Pt states she  Would like to be tested for STD's. Pt states she has a vaginal discharge and it has a odor. This started last night.

## 2019-04-21 NOTE — Discharge Instructions (Signed)
Begin metronidazole twice daily for 1 week to treat for bacterial vaginosis, no alcohol until 24 hours after last tablet  We are testing you for Gonorrhea, Chlamydia, Trichomonas, Yeast and Bacterial Vaginosis. We will call you if anything is positive and let you know if you require any further treatment. Please inform partners of any positive results.   Please return if symptoms not improving with treatment, development of fever, nausea, vomiting, abdominal pain.

## 2019-04-21 NOTE — ED Provider Notes (Signed)
[facility]    CSN: [id number] Arrival date & time: 04/21/19  0109     History   Chief Complaint Chief Complaint  Patient presents with  . Vaginal Discharge    HPI Beverly Marquez is a 28 y.o. female no significant past medical history presenting today for evaluation of vaginal discharge.  Patient states that her discharge started last night.  She has had associated odor.  Denies any pelvic pain or abdominal pain.  Denies fevers nausea or vomiting.  Denies known exposures to STDs, but this is a concern of hers and would like to be checked.  Last menstrual cycle was 04/07/2019.  Typically uses condoms when sexually active.  She is on oral contraceptives, but does not consistently take these. Did recently change soaps.  HPI  Past Medical History:  Diagnosis Date  . No pertinent past medical history   . Trichomonas     Patient Active Problem List   Diagnosis Date Noted  . Contraception 09/12/2016    Past Surgical History:  Procedure Laterality Date  . NO PAST SURGERIES      OB History    Gravida  4   Para  2   Term  2   Preterm      AB  1   Living  3     SAB  1   TAB      Ectopic      Multiple      Live Births  3            Home Medications    Prior to Admission medications   Medication Sig Start Date End Date Taking? Authorizing Provider  Levonorgest-Eth Estrad-Fe Bisg (BALCOLTRA) 0.1-20 MG-MCG(21) TABS Take 1 tablet by mouth daily. Patient not taking: Reported on 04/21/2019 08/21/18   Shelly Bombard, MD  metroNIDAZOLE (FLAGYL) 500 MG tablet Take 1 tablet (500 mg total) by mouth 2 (two) times daily for 7 days. 04/21/19 04/28/19  Dominiq Fontaine, Elesa Hacker, PA-C    Family History History reviewed. No pertinent family history.  Social History Social History   Tobacco Use  . Smoking status: Former Smoker    Types: Cigarettes  . Smokeless tobacco: Never Used  Substance Use Topics  . Alcohol use: No  . Drug use: Yes    Types:  Marijuana     Allergies   Patient has no known allergies.   Review of Systems Review of Systems  Constitutional: Negative for fever.  Respiratory: Negative for shortness of breath.   Cardiovascular: Negative for chest pain.  Gastrointestinal: Negative for abdominal pain, diarrhea, nausea and vomiting.  Genitourinary: Positive for vaginal discharge. Negative for dysuria, flank pain, genital sores, hematuria, menstrual problem, vaginal bleeding and vaginal pain.  Musculoskeletal: Negative for back pain.  Skin: Negative for rash.  Neurological: Negative for dizziness, light-headedness and headaches.     Physical Exam Triage Vital Signs ED Triage Vitals  Enc Vitals Group     BP 04/21/19 0942 106/65     Pulse Rate 04/21/19 0942 82     Resp 04/21/19 0942 18     Temp 04/21/19 0942 98.6 F (37 C)     Temp Source 04/21/19 0942 Oral     SpO2 04/21/19 0942 98 %     Weight 04/21/19 0940 169 lb (76.7 kg)     Height --      Head Circumference --      Peak Flow --      Pain Score 04/21/19  0940 0     Pain Loc --      Pain Edu? --      Excl. in GC? --    No data found.  Updated Vital Signs BP 106/65 (BP Location: Right Arm)   Pulse 82   Temp 98.6 F (37 C) (Oral)   Resp 18   Wt 169 lb (76.7 kg)   LMP 04/07/2019   SpO2 98%   BMI 28.12 kg/m   Visual Acuity Right Eye Distance:   Left Eye Distance:   Bilateral Distance:    Right Eye Near:   Left Eye Near:    Bilateral Near:     Physical Exam Vitals signs and nursing note reviewed.  Constitutional:      Appearance: She is well-developed.     Comments: No acute distress  HENT:     Head: Normocephalic and atraumatic.     Nose: Nose normal.  Eyes:     Conjunctiva/sclera: Conjunctivae normal.  Neck:     Musculoskeletal: Neck supple.  Cardiovascular:     Rate and Rhythm: Normal rate.  Pulmonary:     Effort: Pulmonary effort is normal. No respiratory distress.  Abdominal:     General: There is no distension.      Comments: Soft, nondistended, nontender to light palpation throughout entire abdomen, no focal tenderness, negative rebound  Musculoskeletal: Normal range of motion.  Skin:    General: Skin is warm and dry.  Neurological:     Mental Status: She is alert and oriented to person, place, and time.      UC Treatments / Results  Labs (all labs ordered are listed, but only abnormal results are displayed) Labs Reviewed  POC URINE PREG, ED  POCT URINALYSIS DIP (DEVICE)  POCT PREGNANCY, URINE  CERVICOVAGINAL ANCILLARY ONLY    EKG   Radiology No results found.  Procedures Procedures (including critical care time)  Medications Ordered in UC Medications - No data to display  Initial Impression / Assessment and Plan / UC Course  I have reviewed the triage vital signs and the nursing notes.  Pertinent labs & imaging results that were available during my care of the patient were reviewed by me and considered in my medical decision making (see chart for details).     Patient with vaginal discharge with associated odor, likely bacterial vaginosis as she is previously had this.  Will treat empirically with metronidazole twice daily x1 week.  Swab obtained and will send off to check for STDs as well as confirm cause.  Will call with the patient with results and alter treatment as needed.Discussed strict return precautions. Patient verbalized understanding and is agreeable with plan.  Final Clinical Impressions(s) / UC Diagnoses   Final diagnoses:  Vaginal discharge     Discharge Instructions     Begin metronidazole twice daily for 1 week to treat for bacterial vaginosis, no alcohol until 24 hours after last tablet  We are testing you for Gonorrhea, Chlamydia, Trichomonas, Yeast and Bacterial Vaginosis. We will call you if anything is positive and let you know if you require any further treatment. Please inform partners of any positive results.   Please return if symptoms not  improving with treatment, development of fever, nausea, vomiting, abdominal pain.     ED Prescriptions    Medication Sig Dispense Auth. Provider   metroNIDAZOLE (FLAGYL) 500 MG tablet Take 1 tablet (500 mg total) by mouth 2 (two) times daily for 7 days. 14 tablet Fruma [place], [place]ie C,  PA-C     Controlled Substance Prescriptions [place] Controlled Substance Registry consulted? Not Applicable   Lew DawesWieters, Daiya Tamer C, [place]-C 04/21/19 1007

## 2019-04-22 LAB — CERVICOVAGINAL ANCILLARY ONLY
Bacterial vaginitis: POSITIVE — AB
Candida vaginitis: POSITIVE — AB
Chlamydia: NEGATIVE
Neisseria Gonorrhea: NEGATIVE
Trichomonas: NEGATIVE

## 2019-04-24 ENCOUNTER — Telehealth (HOSPITAL_COMMUNITY): Payer: Self-pay | Admitting: Emergency Medicine

## 2019-04-24 MED ORDER — FLUCONAZOLE 150 MG PO TABS: 150.0000 mg | ORAL_TABLET | Freq: Once | ORAL | 0 refills | Status: AC

## 2019-04-24 NOTE — Telephone Encounter (Signed)
Bacterial Vaginosis test is positive.  Prescription for metronidazole was given at the urgent care visit. Pt contacted regarding results. Answered all questions. Verbalized understanding.  Test for candida (yeast) was positive.  Prescription for fluconazole 150mg po now, repeat dose in 3d if needed, #2 no refills, sent to the pharmacy of record.  Recheck or followup with PCP for further evaluation if symptoms are not improving.    Patient contacted and made aware of all results, all questions answered.   

## 2019-06-08 ENCOUNTER — Other Ambulatory Visit: Payer: Self-pay

## 2019-06-08 ENCOUNTER — Encounter (HOSPITAL_COMMUNITY): Payer: Self-pay | Admitting: Family Medicine

## 2019-06-08 ENCOUNTER — Ambulatory Visit (HOSPITAL_COMMUNITY)
Admission: EM | Admit: 2019-06-08 | Discharge: 2019-06-08 | Disposition: A | Discharge status: Home / Self Care | Payer: Medicaid Other | Attending: Family Medicine | Admitting: Family Medicine

## 2019-06-08 DIAGNOSIS — Z113 Encounter for screening for infections with a predominantly sexual mode of transmission: Secondary | ICD-10-CM | POA: Insufficient documentation

## 2019-06-08 NOTE — ED Provider Notes (Signed)
[facility]    CSN: [id number] Arrival date & time: 06/08/19  [id number]      History   Chief Complaint Chief Complaint  Patient presents with  . SEXUALLY TRANSMITTED DISEASE    HPI Beverly Marquez is a 28 y.o. female.   Patient is a 28 year old female presents today wanting STD screening.  Reporting she has a new partner.  She denies any current symptoms.     Past Medical History:  Diagnosis Date  . No pertinent past medical history   . Trichomonas     Patient Active Problem List   Diagnosis Date Noted  . Contraception 09/12/2016    Past Surgical History:  Procedure Laterality Date  . NO PAST SURGERIES      OB History    Gravida  4   Para  2   Term  2   Preterm      AB  1   Living  3     SAB  1   TAB      Ectopic      Multiple      Live Births  3            Home Medications    Prior to Admission medications   Medication Sig Start Date End Date Taking? Authorizing Provider  Levonorgest-Eth Estrad-Fe Bisg (BALCOLTRA) 0.1-20 MG-MCG(21) TABS Take 1 tablet by mouth daily. Patient not taking: Reported on 04/21/2019 08/21/18   Brock BadHarper, Charles A, MD    Family History History reviewed. No pertinent family history.  Social History Social History   Tobacco Use  . Smoking status: Former Smoker    Types: Cigarettes  . Smokeless tobacco: Never Used  Substance Use Topics  . Alcohol use: No  . Drug use: Yes    Types: Marijuana     Allergies   Patient has no known allergies.   Review of Systems Review of Systems  Genitourinary: Negative.      Physical Exam Triage Vital Signs ED Triage Vitals  Enc Vitals Group     BP 06/08/19 0859 113/71     Pulse Rate 06/08/19 0859 92     Resp 06/08/19 0859 18     Temp 06/08/19 0859 98.2 F (36.8 C)     Temp src --      SpO2 06/08/19 0859 98 %     Weight 06/08/19 0858 154 lb (69.9 kg)     Height --      Head Circumference --      Peak Flow --      Pain Score --      Pain  Loc --      Pain Edu? --      Excl. in GC? --    No data found.  Updated Vital Signs BP 113/71 (BP Location: Right Arm)   Pulse 92   Temp 98.2 F (36.8 C)   Resp 18   Wt 154 lb (69.9 kg)   LMP 06/01/2019   SpO2 98%   BMI 25.63 kg/m   Visual Acuity Right Eye Distance:   Left Eye Distance:   Bilateral Distance:    Right Eye Near:   Left Eye Near:    Bilateral Near:     Physical Exam Vitals signs and nursing note reviewed.  Constitutional:      General: She is not in acute distress.    Appearance: Normal appearance. She is not ill-appearing, toxic-appearing or diaphoretic.  HENT:     Head: Normocephalic.  Nose: Nose normal.     Mouth/Throat:     Pharynx: Oropharynx is clear.  Eyes:     Conjunctiva/sclera: Conjunctivae normal.  Neck:     Musculoskeletal: Normal range of motion.  Pulmonary:     Effort: Pulmonary effort is normal.  Abdominal:     Palpations: Abdomen is soft.     Tenderness: There is no abdominal tenderness.  Musculoskeletal: Normal range of motion.  Skin:    General: Skin is warm and dry.     Findings: No rash.  Neurological:     Mental Status: She is alert.  Psychiatric:        Mood and Affect: Mood normal.      UC Treatments / Results  Labs (all labs ordered are listed, but only abnormal results are displayed) Labs Reviewed  CERVICOVAGINAL ANCILLARY ONLY    EKG   Radiology No results found.  Procedures Procedures (including critical care time)  Medications Ordered in UC Medications - No data to display  Initial Impression / Assessment and Plan / UC Course  I have reviewed the triage vital signs and the nursing notes.  Pertinent labs & imaging results that were available during my care of the patient were reviewed by me and considered in my medical decision making (see chart for details).     Swab sent for testing with labs pending.   Final Clinical Impressions(s) / UC Diagnoses   Final diagnoses:  Screening for  STD (sexually transmitted disease)     Discharge Instructions     Sending your swab for testing.  We will call if your results are positive  Otherwise you can check my chart for results.     ED Prescriptions    None     Controlled Substance Prescriptions [place] Controlled Substance Registry consulted? Not Applicable   Orvan July, NP 06/08/19 [phone]

## 2019-06-08 NOTE — Discharge Instructions (Addendum)
Sending your swab for testing.  We will call if your results are positive  Otherwise you can check my chart for results.

## 2019-06-08 NOTE — ED Triage Notes (Signed)
Pt states she wants to be tested for STD's. Pt states she has a new sexual partner.

## 2019-06-08 NOTE — ED Notes (Signed)
Verified sample and patient label with patient and placed in lab

## 2019-06-10 ENCOUNTER — Telehealth (HOSPITAL_COMMUNITY): Payer: Self-pay | Admitting: Emergency Medicine

## 2019-06-10 LAB — CERVICOVAGINAL ANCILLARY ONLY
Bacterial vaginitis: NEGATIVE
Candida vaginitis: POSITIVE — AB
Chlamydia: NEGATIVE
Neisseria Gonorrhea: NEGATIVE
Trichomonas: NEGATIVE

## 2019-06-10 MED ORDER — FLUCONAZOLE 150 MG PO TABS: 150.0000 mg | ORAL_TABLET | Freq: Once | ORAL | 0 refills | Status: AC

## 2019-06-10 NOTE — Telephone Encounter (Signed)
Test for candida (yeast) was positive.  Prescription for fluconazole 150mg po now, repeat dose in 3d if needed, #2 no refills, sent to the pharmacy of record.  Recheck or followup with PCP for further evaluation if symptoms are not improving.    Patient contacted and made aware of    results, all questions answered   

## 2019-07-22 ENCOUNTER — Ambulatory Visit (HOSPITAL_COMMUNITY)
Admission: EM | Admit: 2019-07-22 | Discharge: 2019-07-22 | Disposition: A | Discharge status: Home / Self Care | Payer: Medicaid Other | Attending: Urgent Care | Admitting: Urgent Care

## 2019-07-22 ENCOUNTER — Encounter (HOSPITAL_COMMUNITY): Payer: Self-pay

## 2019-07-22 ENCOUNTER — Other Ambulatory Visit: Payer: Self-pay

## 2019-07-22 DIAGNOSIS — N761 Subacute and chronic vaginitis: Secondary | ICD-10-CM | POA: Diagnosis not present

## 2019-07-22 MED ORDER — FLUCONAZOLE 200 MG PO TABS: 200.0000 mg | ORAL_TABLET | ORAL | 0 refills | Status: DC

## 2019-07-22 NOTE — ED Triage Notes (Signed)
Pt presents to UC stating she has had vaginal odor x2 days. Denies itching, pain, or discharge. Pt states she has had yeast infections in the past.

## 2019-07-22 NOTE — ED Provider Notes (Signed)
  MRN: [id number] DOB: [birth year]  Subjective:   Beverly Marquez is a 28 y.o. female presenting for 2-day history recurrent vaginal odor.  Has had multiple bouts of yeast vaginitis in the past year.  One episode of BV.  She has not set up follow-up with her gynecologist.  Denies genital pain, vaginal itching, vaginal discharge, dysuria, hematuria.  Denies taking chronic medications.   No Known Allergies  Past Medical History:  Diagnosis Date  . No pertinent past medical history   . Trichomonas      Past Surgical History:  Procedure Laterality Date  . NO PAST SURGERIES      ROS  Objective:   Vitals: BP 102/68 (BP Location: Right Arm)   Pulse 84   Temp 98.4 F (36.9 C) (Oral)   Resp 16   LMP 07/10/2019   SpO2 98%   Physical Exam Constitutional:      General: She is not in acute distress.    Appearance: Normal appearance. She is well-developed. She is not ill-appearing, toxic-appearing or diaphoretic.  HENT:     Head: Normocephalic and atraumatic.     Nose: Nose normal.     Mouth/Throat:     Mouth: Mucous membranes are moist.     Pharynx: Oropharynx is clear.  Eyes:     General: No scleral icterus.    Extraocular Movements: Extraocular movements intact.     Pupils: Pupils are equal, round, and reactive to light.  Cardiovascular:     Rate and Rhythm: Normal rate.  Pulmonary:     Effort: Pulmonary effort is normal.  Skin:    General: Skin is warm and dry.  Neurological:     General: No focal deficit present.     Mental Status: She is alert and oriented to person, place, and time.  Psychiatric:        Mood and Affect: Mood normal.        Behavior: Behavior normal.        Thought Content: Thought content normal.        Judgment: Judgment normal.      Assessment and Plan :   1. Chronic vaginitis     We will increase Diflucan dose to 200 mg and provide with 1 month course.  Emphasized need to check with gynecologist about recurrent vaginitis.  STI  testing pending. Counseled patient on potential for adverse effects with medications prescribed/recommended today, ER and return-to-clinic precautions discussed, patient verbalized understanding.    Jaynee Eagles, [place] 07/22/19 [phone]

## 2019-07-24 LAB — CERVICOVAGINAL ANCILLARY ONLY
Bacterial vaginitis: POSITIVE — AB
Candida vaginitis: NEGATIVE
Chlamydia: NEGATIVE
Neisseria Gonorrhea: NEGATIVE
Trichomonas: NEGATIVE

## 2019-07-27 ENCOUNTER — Telehealth (HOSPITAL_COMMUNITY): Payer: Self-pay | Admitting: Emergency Medicine

## 2019-07-27 MED ORDER — METRONIDAZOLE 500 MG PO TABS: 500.0000 mg | ORAL_TABLET | Freq: Two times a day (BID) | ORAL | 0 refills | Status: AC

## 2019-07-27 NOTE — Telephone Encounter (Signed)
Bacterial vaginosis is positive. This was not treated at the urgent care visit.  Flagyl 500 mg BID x 7 days #14 no refills sent to patients pharmacy of choice.    Patient contacted and made aware of    results, all questions answered   

## 2019-08-17 ENCOUNTER — Ambulatory Visit (HOSPITAL_COMMUNITY)
Admission: EM | Admit: 2019-08-17 | Discharge: 2019-08-17 | Disposition: A | Discharge status: Home / Self Care | Payer: Medicaid Other | Attending: Family Medicine | Admitting: Family Medicine

## 2019-08-17 ENCOUNTER — Encounter (HOSPITAL_COMMUNITY): Payer: Self-pay

## 2019-08-17 ENCOUNTER — Other Ambulatory Visit: Payer: Self-pay

## 2019-08-17 DIAGNOSIS — Z202 Contact with and (suspected) exposure to infections with a predominantly sexual mode of transmission: Secondary | ICD-10-CM | POA: Diagnosis not present

## 2019-08-17 LAB — HIV ANTIBODY (ROUTINE TESTING W REFLEX): HIV Screen 4th Generation wRfx: NONREACTIVE

## 2019-08-17 NOTE — Discharge Instructions (Addendum)
We have sent testing to the laboratory. You will be called if any of your tests are positive You may call the office or check your chart online for your test results

## 2019-08-17 NOTE — ED Triage Notes (Signed)
Patient presents to Urgent Care with complaints of needing STD testing since having unprotected intercourse. Patient reports she is not having symptoms but "you can never put nothing past a man".

## 2019-08-17 NOTE — ED Provider Notes (Signed)
[facility]    CSN: [id number] Arrival date & time: 08/17/19  1733      History   Chief Complaint Chief Complaint  Patient presents with  . SEXUALLY TRANSMITTED DISEASE    HPI Beverly Marquez is a 28 y.o. female.   HPI  Patient is here requesting testing for "all" sexually transmitted diseases.  She states she has reason not to trust her "man".  She is having no symptoms.  No discharge.  No pain  Past Medical History:  Diagnosis Date  . No pertinent past medical history   . Trichomonas     Patient Active Problem List   Diagnosis Date Noted  . Contraception 09/12/2016    Past Surgical History:  Procedure Laterality Date  . NO PAST SURGERIES      OB History    Gravida  4   Para  2   Term  2   Preterm      AB  1   Living  3     SAB  1   TAB      Ectopic      Multiple      Live Births  3            Home Medications    Prior to Admission medications   Medication Sig Start Date End Date Taking? Authorizing Provider  fluconazole (DIFLUCAN) 200 MG tablet Take 1 tablet (200 mg total) by mouth once a week. 07/22/19   Wallis Bamberg, PA-C    Family History Family History  Problem Relation Age of Onset  . Hypertension Mother   . Healthy Father     Social History Social History   Tobacco Use  . Smoking status: Former Smoker    Types: Cigarettes  . Smokeless tobacco: Never Used  Substance Use Topics  . Alcohol use: No  . Drug use: Yes    Types: Marijuana     Allergies   Patient has no known allergies.   Review of Systems Review of Systems  Constitutional: Negative for chills and fever.  HENT: Negative for ear pain and sore throat.   Eyes: Negative for pain and visual disturbance.  Respiratory: Negative for cough and shortness of breath.   Cardiovascular: Negative for chest pain and palpitations.  Gastrointestinal: Negative for abdominal pain and vomiting.  Genitourinary: Negative for dysuria and hematuria.   Musculoskeletal: Negative for arthralgias and back pain.  Skin: Negative for color change and rash.  Neurological: Negative for seizures and syncope.  All other systems reviewed and are negative.    Physical Exam Triage Vital Signs ED Triage Vitals  Enc Vitals Group     BP 08/17/19 1755 (!) 110/58     Pulse Rate 08/17/19 1755 75     Resp 08/17/19 1755 16     Temp 08/17/19 1755 98.2 F (36.8 C)     Temp Source 08/17/19 1755 Oral     SpO2 08/17/19 1755 100 %     Weight --      Height --      Head Circumference --      Peak Flow --      Pain Score 08/17/19 1753 0     Pain Loc --      Pain Edu? --      Excl. in GC? --    No data found.  Updated Vital Signs BP (!) 110/58 (BP Location: Right Arm)   Pulse 75   Temp 98.2 F (36.8 C) (  Oral)   Resp 16   SpO2 100%      Physical Exam Constitutional:      General: She is not in acute distress.    Appearance: She is well-developed.  HENT:     Head: Normocephalic and atraumatic.  Eyes:     Conjunctiva/sclera: Conjunctivae normal.     Pupils: Pupils are equal, round, and reactive to light.  Neck:     Musculoskeletal: Normal range of motion.  Cardiovascular:     Rate and Rhythm: Normal rate.  Pulmonary:     Effort: Pulmonary effort is normal. No respiratory distress.  Abdominal:     General: There is no distension.     Palpations: Abdomen is soft.  Genitourinary:    Comments: No indication for exam Musculoskeletal: Normal range of motion.  Skin:    General: Skin is warm and dry.  Neurological:     Mental Status: She is alert.  Psychiatric:        Mood and Affect: Mood normal.        Behavior: Behavior normal.      UC Treatments / Results  Labs (all labs ordered are listed, but only abnormal results are displayed) Labs Reviewed  HIV ANTIBODY (ROUTINE TESTING W REFLEX)  RPR  CERVICOVAGINAL ANCILLARY ONLY    EKG   Radiology No results found.  Procedures Procedures (including critical care time)   Medications Ordered in UC Medications - No data to display  Initial Impression / Assessment and Plan / UC Course  I have reviewed the triage vital signs and the nursing notes.  Pertinent labs & imaging results that were available during my care of the patient were reviewed by me and considered in my medical decision making (see chart for details).      Final Clinical Impressions(s) / UC Diagnoses   Final diagnoses:  Possible exposure to STD     Discharge Instructions     We have sent testing to the laboratory. You will be called if any of your tests are positive You may call the office or check your chart online for your test results   ED Prescriptions    None     PDMP not reviewed this encounter.   Raylene Everts, MD 08/17/19 [phone]

## 2019-08-18 LAB — RPR: RPR Ser Ql: NONREACTIVE

## 2019-08-19 LAB — CERVICOVAGINAL ANCILLARY ONLY
Chlamydia: NEGATIVE
Neisseria Gonorrhea: NEGATIVE
Trichomonas: NEGATIVE

## 2019-09-05 DIAGNOSIS — Z03818 Encounter for observation for suspected exposure to other biological agents ruled out: Secondary | ICD-10-CM | POA: Diagnosis not present

## 2019-10-23 ENCOUNTER — Ambulatory Visit (HOSPITAL_COMMUNITY)
Admission: EM | Admit: 2019-10-23 | Discharge: 2019-10-23 | Disposition: A | Discharge status: Home / Self Care | Payer: Medicaid Other | Attending: Emergency Medicine | Admitting: Emergency Medicine

## 2019-10-23 ENCOUNTER — Encounter (HOSPITAL_COMMUNITY): Payer: Self-pay

## 2019-10-23 ENCOUNTER — Other Ambulatory Visit: Payer: Self-pay

## 2019-10-23 DIAGNOSIS — Z202 Contact with and (suspected) exposure to infections with a predominantly sexual mode of transmission: Secondary | ICD-10-CM

## 2019-10-23 DIAGNOSIS — Z113 Encounter for screening for infections with a predominantly sexual mode of transmission: Secondary | ICD-10-CM | POA: Diagnosis not present

## 2019-10-23 NOTE — ED Triage Notes (Signed)
Patient presents to Urgent Care with complaints of requesting STD testing since "you can never be too sure with these men out here". Patient reports she does not wish to test for HIV.

## 2019-10-23 NOTE — ED Provider Notes (Signed)
[place]    CSN: [id number] Arrival date & time: 10/23/19  1660      History   Chief Complaint Chief Complaint  Patient presents with  . SEXUALLY TRANSMITTED DISEASE    HPI Beverly Marquez is a 29 y.o. female.   Beverly Marquez presents with requests for std screening. She denies any vaginal symptoms. No pelvic pain, no back pain. No urinary symptoms. Has not missed a period, she is not on birth control. Sexually active with one partner, doesn't use condoms. No specific known exposure to stds. Denies any previous std's. Declines hiv/rpr screening.     ROS per HPI, negative if not otherwise mentioned.      Past Medical History:  Diagnosis Date  . No pertinent past medical history   . Trichomonas     Patient Active Problem List   Diagnosis Date Noted  . Contraception 09/12/2016    Past Surgical History:  Procedure Laterality Date  . NO PAST SURGERIES      OB History    Gravida  4   Para  2   Term  2   Preterm      AB  1   Living  3     SAB  1   TAB      Ectopic      Multiple      Live Births  3            Home Medications    Prior to Admission medications   Medication Sig Start Date End Date Taking? Authorizing Provider  fluconazole (DIFLUCAN) 200 MG tablet Take 1 tablet (200 mg total) by mouth once a week. 07/22/19   Jaynee Eagles, PA-C    Family History Family History  Problem Relation Age of Onset  . Hypertension Mother   . Healthy Father     Social History Social History   Tobacco Use  . Smoking status: Former Smoker    Types: Cigarettes  . Smokeless tobacco: Never Used  Substance Use Topics  . Alcohol use: No  . Drug use: Yes    Types: Marijuana     Allergies   Patient has no known allergies.   Review of Systems Review of Systems   Physical Exam Triage Vital Signs ED Triage Vitals  Enc Vitals Group     BP 10/23/19 0817 104/64     Pulse Rate 10/23/19 0817 78     Resp 10/23/19 0817  15     Temp 10/23/19 0817 98.7 F (37.1 C)     Temp Source 10/23/19 0817 Oral     SpO2 10/23/19 0817 98 %     Weight --      Height --      Head Circumference --      Peak Flow --      Pain Score 10/23/19 0816 0     Pain Loc --      Pain Edu? --      Excl. in [place]? --    No data found.  Updated Vital Signs BP 104/64 (BP Location: Left Arm)   Pulse 78   Temp 98.7 F (37.1 C) (Oral)   Resp 15   SpO2 98%   Visual Acuity Right Eye Distance:   Left Eye Distance:   Bilateral Distance:    Right Eye Near:   Left Eye Near:    Bilateral Near:     Physical Exam Constitutional:      General: She is  not in acute distress.    Appearance: She is well-developed.  Cardiovascular:     Rate and Rhythm: Normal rate.  Pulmonary:     Effort: Pulmonary effort is normal.  Abdominal:     Palpations: Abdomen is not rigid.     Tenderness: There is no abdominal tenderness. There is no guarding or rebound.  Genitourinary:    Comments: Denies sores, lesions, vaginal bleeding; no pelvic pain; gu exam deferred at this time, vaginal self swab collected.   Skin:    General: Skin is warm and dry.  Neurological:     Mental Status: She is alert and oriented to person, place, and time.      UC Treatments / Results  Labs (all labs ordered are listed, but only abnormal results are displayed) Labs Reviewed  CERVICOVAGINAL ANCILLARY ONLY    EKG   Radiology No results found.  Procedures Procedures (including critical care time)  Medications Ordered in UC Medications - No data to display  Initial Impression / Assessment and Plan / UC Course  I have reviewed the triage vital signs and the nursing notes.  Pertinent labs & imaging results that were available during my care of the patient were reviewed by me and considered in my medical decision making (see chart for details).     Vaginal cytology by self swab collected and pending. Will notify of any positive findings and if any  changes to treatment are needed.  Patient has mychart. Safe sex encouraged. Patient verbalized understanding and agreeable to plan.   Final Clinical Impressions(s) / UC Diagnoses   Final diagnoses:  Screen for STD (sexually transmitted disease)     Discharge Instructions     Will notify of any positive findings and if any changes to treatment are needed.  You may monitor your results on your MyChart online as well.   We always recommend condom use to prevent STDs   ED Prescriptions    None     PDMP not reviewed this encounter.   Georgetta Haber, NP 10/23/19 [phone]

## 2019-10-23 NOTE — Discharge Instructions (Signed)
Will notify of any positive findings and if any changes to treatment are needed.  You may monitor your results on your MyChart online as well.   We always recommend condom use to prevent STDs

## 2019-10-27 ENCOUNTER — Telehealth (HOSPITAL_COMMUNITY): Payer: Self-pay | Admitting: Emergency Medicine

## 2019-10-27 LAB — CERVICOVAGINAL ANCILLARY ONLY
Bacterial vaginitis: POSITIVE — AB
Candida vaginitis: POSITIVE — AB
Chlamydia: NEGATIVE
Neisseria Gonorrhea: NEGATIVE
Trichomonas: NEGATIVE

## 2019-10-27 MED ORDER — METRONIDAZOLE 500 MG PO TABS: 500.0000 mg | ORAL_TABLET | Freq: Two times a day (BID) | ORAL | 0 refills | Status: AC

## 2019-10-27 MED ORDER — FLUCONAZOLE 150 MG PO TABS: 150.0000 mg | ORAL_TABLET | Freq: Once | ORAL | 0 refills | Status: AC

## 2019-10-27 NOTE — Telephone Encounter (Signed)
Bacterial vaginosis is positive. Pt needs treatment. Flagyl 500 mg BID x 7 days #14 no refills sent to patients pharmacy of choice.   Test for candida (yeast) was positive.  Prescription for fluconazole 150mg po now, repeat dose in 3d if needed, #2 no refills, sent to the pharmacy of record.  Recheck or followup with PCP for further evaluation if symptoms are not improving.    Patient contacted by phone and made aware of    results. Pt verbalized understanding and had all questions answered.  

## 2019-11-09 ENCOUNTER — Other Ambulatory Visit: Payer: Self-pay

## 2019-11-09 ENCOUNTER — Ambulatory Visit (INDEPENDENT_AMBULATORY_CARE_PROVIDER_SITE_OTHER): Payer: Medicaid Other

## 2019-11-09 DIAGNOSIS — Z3200 Encounter for pregnancy test, result unknown: Secondary | ICD-10-CM

## 2019-11-09 LAB — POCT URINE PREGNANCY: Preg Test, Ur: POSITIVE — AB

## 2019-11-09 NOTE — Progress Notes (Signed)
Patient seen and assessed by nursing staff during this encounter. I have reviewed the chart and agree with the documentation and plan.  Allyn Bartelson, MD 11/09/2019 11:50 AM    

## 2019-11-09 NOTE — Progress Notes (Signed)
..   [name] presents today for UPT. She has no unusual complaints. LMP: 10-08-19    OBJECTIVE: Appears well, in no apparent distress.  OB History    Gravida  5   Para  3   Term  3   Preterm      AB  1   Living  3     SAB  1   TAB      Ectopic      Multiple      Live Births  3          Home UPT Result:Positive In-Office UPT result:Positive I have reviewed the patient's medical, obstetrical, social, and family histories, and medications.   ASSESSMENT: Positive pregnancy test  PLAN Prenatal care to be completed at: [facility]

## 2019-12-13 ENCOUNTER — Other Ambulatory Visit: Payer: Self-pay | Admitting: Obstetrics

## 2019-12-13 DIAGNOSIS — Z30011 Encounter for initial prescription of contraceptive pills: Secondary | ICD-10-CM

## 2019-12-21 ENCOUNTER — Ambulatory Visit (HOSPITAL_COMMUNITY)
Admission: EM | Admit: 2019-12-21 | Discharge: 2019-12-21 | Disposition: A | Discharge status: Home / Self Care | Payer: Medicaid Other | Attending: Family Medicine | Admitting: Family Medicine

## 2019-12-21 ENCOUNTER — Other Ambulatory Visit: Payer: Self-pay

## 2019-12-21 ENCOUNTER — Encounter (HOSPITAL_COMMUNITY): Payer: Self-pay | Admitting: Emergency Medicine

## 2019-12-21 ENCOUNTER — Inpatient Hospital Stay (HOSPITAL_COMMUNITY): Admission: RE | Admit: 2019-12-21 | Payer: Medicaid Other | Source: Ambulatory Visit

## 2019-12-21 DIAGNOSIS — N898 Other specified noninflammatory disorders of vagina: Secondary | ICD-10-CM | POA: Insufficient documentation

## 2019-12-21 MED ORDER — METRONIDAZOLE 500 MG PO TABS: 500.0000 mg | ORAL_TABLET | Freq: Two times a day (BID) | ORAL | 0 refills | Status: DC

## 2019-12-21 NOTE — Discharge Instructions (Addendum)
Treating you for BV Take the medication as prescribed Swab sent for testing.  Follow up as needed for continued or worsening symptoms

## 2019-12-21 NOTE — ED Provider Notes (Signed)
[facility]    CSN: [id number] Arrival date & time: 12/21/19  0803      History   Chief Complaint Chief Complaint  Patient presents with  . Exposure to STD    HPI Beverly Marquez is a 29 y.o. female.   Pt is a 29 year old female that presents with vaginal discharge and odor.  This has been constant and worsening over the past few days.  Reporting she had a therapeutic abortion approximate 2 weeks ago.  She is currently having some vaginal bleeding.  Denies any abdominal pain, back pain or fevers.  Would like to be screened for STDs.  ROS per HPI      Past Medical History:  Diagnosis Date  . No pertinent past medical history   . Trichomonas     Patient Active Problem List   Diagnosis Date Noted  . Contraception 09/12/2016    Past Surgical History:  Procedure Laterality Date  . NO PAST SURGERIES      OB History    Gravida  5   Para  3   Term  3   Preterm      AB  1   Living  3     SAB  1   TAB      Ectopic      Multiple      Live Births  3            Home Medications    Prior to Admission medications   Medication Sig Start Date End Date Taking? Authorizing Provider  BALCOLTRA 0.1-20 MG-MCG(21) TABS TAKE 1 TABLET BY MOUTH DAILY 12/14/19   Brock Bad, MD  metroNIDAZOLE (FLAGYL) 500 MG tablet Take 1 tablet (500 mg total) by mouth 2 (two) times daily. 12/21/19   Dahlia Byes A, NP  Prenatal Vit-Fe Fumarate-FA (MULTIVITAMIN-PRENATAL) 27-0.8 MG TABS tablet Take 1 tablet by mouth daily at 12 noon.    [provider]    Family History Family History  Problem Relation Age of Onset  . Hypertension Mother   . Healthy Father     Social History Social History   Tobacco Use  . Smoking status: Former Smoker    Types: Cigarettes  . Smokeless tobacco: Never Used  Substance Use Topics  . Alcohol use: No  . Drug use: Yes    Types: Marijuana     Allergies   Patient has no known allergies.   Review of  Systems Review of Systems   Physical Exam Triage Vital Signs ED Triage Vitals  Enc Vitals Group     BP 12/21/19 0815 (!) 109/52     Pulse Rate 12/21/19 0815 71     Resp 12/21/19 0815 18     Temp 12/21/19 0815 97.9 F (36.6 C)     Temp Source 12/21/19 0815 Oral     SpO2 12/21/19 0815 100 %     Weight --      Height --      Head Circumference --      Peak Flow --      Pain Score 12/21/19 0816 0     Pain Loc --      Pain Edu? --      Excl. in [place]? --    No data found.  Updated Vital Signs BP (!) 109/52 (BP Location: Right Arm)   Pulse 71   Temp 97.9 F (36.6 C) (Oral)   Resp 18   LMP 07/10/2019 (LMP Unknown)  SpO2 100%   Breastfeeding Unknown   Visual Acuity Right Eye Distance:   Left Eye Distance:   Bilateral Distance:    Right Eye Near:   Left Eye Near:    Bilateral Near:     Physical Exam Vitals and nursing note reviewed.  Constitutional:      General: She is not in acute distress.    Appearance: Normal appearance. She is not ill-appearing, toxic-appearing or diaphoretic.  HENT:     Head: Normocephalic.     Nose: Nose normal.  Eyes:     Conjunctiva/sclera: Conjunctivae normal.  Pulmonary:     Effort: Pulmonary effort is normal.  Musculoskeletal:        General: Normal range of motion.     Cervical back: Normal range of motion.  Skin:    General: Skin is warm and dry.     Findings: No rash.  Neurological:     Mental Status: She is alert.  Psychiatric:        Mood and Affect: Mood normal.      UC Treatments / Results  Labs (all labs ordered are listed, but only abnormal results are displayed) Labs Reviewed  CERVICOVAGINAL ANCILLARY ONLY    EKG   Radiology No results found.  Procedures Procedures (including critical care time)  Medications Ordered in UC Medications - No data to display  Initial Impression / Assessment and Plan / UC Course  I have reviewed the triage vital signs and the nursing notes.  Pertinent labs & imaging  results that were available during my care of the patient were reviewed by me and considered in my medical decision making (see chart for details).     Vaginal discharge and odor-patient with history of bacterial vaginosis and reports this feels similar.  We will go ahead and treat with Flagyl. Sending swab for testing with labs pending. Follow up as needed for continued or worsening symptoms  Final Clinical Impressions(s) / UC Diagnoses   Final diagnoses:  Vaginal odor     Discharge Instructions     Treating you for BV Take the medication as prescribed Swab sent for testing.  Follow up as needed for continued or worsening symptoms     ED Prescriptions    Medication Sig Dispense Auth. Provider   metroNIDAZOLE (FLAGYL) 500 MG tablet Take 1 tablet (500 mg total) by mouth 2 (two) times daily. 14 tablet Damauri Minion A, NP     PDMP not reviewed this encounter.   Loura Halt A, NP 12/21/19 1058

## 2019-12-21 NOTE — ED Triage Notes (Addendum)
Pt here for vaginal discharge she thinks she has a bacterial infection; pt sts had abortion 2 weeks ago

## 2019-12-24 ENCOUNTER — Encounter: Payer: Medicaid Other | Admitting: Advanced Practice Midwife

## 2019-12-24 LAB — CERVICOVAGINAL ANCILLARY ONLY
Bacterial vaginitis: POSITIVE — AB
Candida vaginitis: NEGATIVE
Chlamydia: NEGATIVE
Neisseria Gonorrhea: NEGATIVE
Trichomonas: NEGATIVE

## 2020-02-10 ENCOUNTER — Encounter (HOSPITAL_COMMUNITY): Payer: Self-pay

## 2020-02-10 ENCOUNTER — Ambulatory Visit (HOSPITAL_COMMUNITY)
Admission: EM | Admit: 2020-02-10 | Discharge: 2020-02-10 | Disposition: A | Discharge status: Home / Self Care | Payer: Medicaid Other | Attending: Family Medicine | Admitting: Family Medicine

## 2020-02-10 ENCOUNTER — Other Ambulatory Visit: Payer: Self-pay

## 2020-02-10 DIAGNOSIS — N898 Other specified noninflammatory disorders of vagina: Secondary | ICD-10-CM | POA: Diagnosis not present

## 2020-02-10 LAB — RAPID HIV SCREEN (HIV 1/2 AB+AG)
HIV 1/2 Antibodies: NONREACTIVE
HIV-1 P24 Antigen - HIV24: NONREACTIVE

## 2020-02-10 MED ORDER — METRONIDAZOLE 500 MG PO TABS: 500.0000 mg | ORAL_TABLET | Freq: Two times a day (BID) | ORAL | 0 refills | Status: DC

## 2020-02-10 NOTE — ED Triage Notes (Signed)
Pt c/o foul smelling vaginal discharge started this morning. Pt states she knows it's a bacterial vaginosis. Pt wants tested for all STIs.

## 2020-02-10 NOTE — Discharge Instructions (Addendum)
Treating you for BV We will call you with any positive results of your swab or blood testing. Contact given for women's health follow-up for any continued issues

## 2020-02-10 NOTE — ED Provider Notes (Signed)
[facility]    CSN: [id number] Arrival date & time: 02/10/20  7616      History   Chief Complaint Chief Complaint  Patient presents with  . STI testing    HPI Beverly Marquez is a 29 y.o. female.   Patient is a 29 year old female presents today for vaginal discharge, odor.  Symptoms consistent with previous BV infections.  She has he is recurrent.  Was seen here approximately 1 month ago and treated for BV.  The symptoms started this morning upon waking up.  She also would be checked for STDs.  Denies any abdominal pain, back pain, fever, dysuria, hematuria or urinary frequency.  ROS per HPI      Past Medical History:  Diagnosis Date  . No pertinent past medical history   . Trichomonas     Patient Active Problem List   Diagnosis Date Noted  . Contraception 09/12/2016    Past Surgical History:  Procedure Laterality Date  . NO PAST SURGERIES      OB History    Gravida  5   Para  3   Term  3   Preterm      AB  1   Living  3     SAB  1   TAB      Ectopic      Multiple      Live Births  3            Home Medications    Prior to Admission medications   Medication Sig Start Date End Date Taking? Authorizing Provider  BALCOLTRA 0.1-20 MG-MCG(21) TABS TAKE 1 TABLET BY MOUTH DAILY 12/14/19   Brock Bad, MD  metroNIDAZOLE (FLAGYL) 500 MG tablet Take 1 tablet (500 mg total) by mouth 2 (two) times daily. 02/10/20   Janace Aris, NP    Family History Family History  Problem Relation Age of Onset  . Hypertension Mother   . Healthy Father     Social History Social History   Tobacco Use  . Smoking status: Former Smoker    Types: Cigarettes  . Smokeless tobacco: Never Used  Substance Use Topics  . Alcohol use: No  . Drug use: Yes    Types: Marijuana     Allergies   Patient has no known allergies.   Review of Systems Review of Systems   Physical Exam Triage Vital Signs ED Triage Vitals [02/10/20 0839]    Enc Vitals Group     BP 103/66     Pulse Rate 87     Resp 16     Temp 98.5 F (36.9 C)     Temp Source Oral     SpO2 98 %     Weight 145 lb (65.8 kg)     Height 5\' 5"  (1.651 m)     Head Circumference      Peak Flow      Pain Score 0     Pain Loc      Pain Edu?      Excl. in [place]?    No data found.  Updated Vital Signs BP 103/66   Pulse 87   Temp 98.5 F (36.9 C) (Oral)   Resp 16   Ht 5\' 5"  (1.651 m)   Wt 145 lb (65.8 kg)   LMP 07/10/2019   SpO2 98%   BMI 24.13 kg/m   Visual Acuity Right Eye Distance:   Left Eye Distance:   Bilateral Distance:  Right Eye Near:   Left Eye Near:    Bilateral Near:     Physical Exam Vitals and nursing note reviewed.  Constitutional:      General: She is not in acute distress.    Appearance: Normal appearance. She is not ill-appearing, toxic-appearing or diaphoretic.  HENT:     Head: Normocephalic.     Nose: Nose normal.  Eyes:     Conjunctiva/sclera: Conjunctivae normal.  Pulmonary:     Effort: Pulmonary effort is normal.  Musculoskeletal:        General: Normal range of motion.     Cervical back: Normal range of motion.  Skin:    General: Skin is warm and dry.     Findings: No rash.  Neurological:     Mental Status: She is alert.  Psychiatric:        Mood and Affect: Mood normal.      UC Treatments / Results  Labs (all labs ordered are listed, but only abnormal results are displayed) Labs Reviewed  RAPID HIV SCREEN (HIV 1/2 AB+AG)  RPR  CERVICOVAGINAL ANCILLARY ONLY    EKG   Radiology No results found.  Procedures Procedures (including critical care time)  Medications Ordered in UC Medications - No data to display  Initial Impression / Assessment and Plan / UC Course  I have reviewed the triage vital signs and the nursing notes.  Pertinent labs & imaging results that were available during my care of the patient were reviewed by me and considered in my medical decision making (see chart for  details).     Vaginal discharge Treating for BV based on hx and symptoms Self swab sent for testing.  HIV and RPR done upon request.  Final Clinical Impressions(s) / UC Diagnoses   Final diagnoses:  Vaginal discharge     Discharge Instructions     Treating you for BV We will call you with any positive results of your swab or blood testing. Contact given for women's health follow-up for any continued issues    ED Prescriptions    Medication Sig Dispense Auth. Provider   metroNIDAZOLE (FLAGYL) 500 MG tablet Take 1 tablet (500 mg total) by mouth 2 (two) times daily. 14 tablet Domnique Vanegas A, NP     PDMP not reviewed this encounter.   Loura Halt A, NP 02/10/20 [phone]

## 2020-02-11 LAB — CERVICOVAGINAL ANCILLARY ONLY
Bacterial Vaginitis (gardnerella): POSITIVE — AB
Candida Glabrata: NEGATIVE
Candida Vaginitis: NEGATIVE
Chlamydia: NEGATIVE
Comment: NEGATIVE
Comment: NEGATIVE
Comment: NEGATIVE
Comment: NEGATIVE
Comment: NEGATIVE
Comment: NORMAL
Neisseria Gonorrhea: NEGATIVE
Trichomonas: NEGATIVE

## 2020-02-11 LAB — RPR: RPR Ser Ql: NONREACTIVE

## 2020-03-07 ENCOUNTER — Other Ambulatory Visit: Payer: Self-pay

## 2020-03-07 ENCOUNTER — Ambulatory Visit (HOSPITAL_COMMUNITY)
Admission: EM | Admit: 2020-03-07 | Discharge: 2020-03-07 | Disposition: A | Discharge status: Home / Self Care | Payer: Medicaid Other | Attending: Family Medicine | Admitting: Family Medicine

## 2020-03-07 DIAGNOSIS — B9689 Other specified bacterial agents as the cause of diseases classified elsewhere: Secondary | ICD-10-CM

## 2020-03-07 DIAGNOSIS — N760 Acute vaginitis: Secondary | ICD-10-CM | POA: Diagnosis not present

## 2020-03-07 MED ORDER — METRONIDAZOLE 500 MG PO TABS: 500.0000 mg | ORAL_TABLET | Freq: Two times a day (BID) | ORAL | 0 refills | Status: DC

## 2020-03-07 NOTE — ED Provider Notes (Signed)
[place]    CSN: [id number] Arrival date & time: 03/07/20  [place]      History   Chief Complaint Chief Complaint  Patient presents with  . Vaginal Discharge    HPI Beverly Marquez is a 29 y.o. female.   HPI  Patient has chronic daily headaches.  She is taking ibuprofen.  She states that she would like a referral to a family doctor for evaluation and ongoing treatment. Patient states she also has recurring BV.  She gets it every month for the last 3 months.  She desires treatment with metronidazole.  She does not feel like she needs testing for STDs.  This was done last month and was negative She denies any abdominal pain fever or chills.  She denies dysuria or frequency  Past Medical History:  Diagnosis Date  . No pertinent past medical history   . Trichomonas     Patient Active Problem List   Diagnosis Date Noted  . Contraception 09/12/2016    Past Surgical History:  Procedure Laterality Date  . NO PAST SURGERIES      OB History    Gravida  5   Para  3   Term  3   Preterm      AB  1   Living  3     SAB  1   TAB      Ectopic      Multiple      Live Births  3            Home Medications    Prior to Admission medications   Medication Sig Start Date End Date Taking? Authorizing Provider  metroNIDAZOLE (FLAGYL) 500 MG tablet Take 1 tablet (500 mg total) by mouth 2 (two) times daily. 03/07/20   Raylene Everts, MD    Family History Family History  Problem Relation Age of Onset  . Hypertension Mother   . Healthy Father     Social History Social History   Tobacco Use  . Smoking status: Former Smoker    Types: Cigarettes  . Smokeless tobacco: Never Used  Substance Use Topics  . Alcohol use: No  . Drug use: Yes    Types: Marijuana     Allergies   Patient has no known allergies.   Review of Systems Review of Systems  Genitourinary: Positive for vaginal discharge.  Neurological: Positive for headaches.      Physical Exam Triage Vital Signs ED Triage Vitals  Enc Vitals Group     BP 03/07/20 1949 115/65     Pulse Rate 03/07/20 1949 71     Resp 03/07/20 1949 18     Temp 03/07/20 1949 98.2 F (36.8 C)     Temp Source 03/07/20 1949 Oral     SpO2 03/07/20 1949 100 %     Weight --      Height --      Head Circumference --      Peak Flow --      Pain Score 03/07/20 1947 0     Pain Loc --      Pain Edu? --      Excl. in [place]? --    No data found.  Updated Vital Signs BP 115/65 (BP Location: Right Arm)   Pulse 71   Temp 98.2 F (36.8 C) (Oral)   Resp 18   LMP 07/10/2019   SpO2 100%  :     Physical Exam Constitutional:  General: She is not in acute distress.    Appearance: She is well-developed.  HENT:     Head: Normocephalic and atraumatic.  Eyes:     Conjunctiva/sclera: Conjunctivae normal.     Pupils: Pupils are equal, round, and reactive to light.  Cardiovascular:     Rate and Rhythm: Normal rate.  Pulmonary:     Effort: Pulmonary effort is normal. No respiratory distress.  Musculoskeletal:        General: Normal range of motion.     Cervical back: Normal range of motion.  Skin:    General: Skin is warm and dry.  Neurological:     Mental Status: She is alert.  Psychiatric:        Mood and Affect: Mood normal.        Behavior: Behavior normal.      UC Treatments / Results  Labs (all labs ordered are listed, but only abnormal results are displayed) Labs Reviewed - No data to display  EKG   Radiology No results found.  Procedures Procedures (including critical care time)  Medications Ordered in UC Medications - No data to display  Initial Impression / Assessment and Plan / UC Course  I have reviewed the triage vital signs and the nursing notes.  Pertinent labs & imaging results that were available during my care of the patient were reviewed by me and considered in my medical decision making (see chart for details).     Advised that a  probiotic for women may reduce the frequency of her bacterial vaginitis recurrences Final Clinical Impressions(s) / UC Diagnoses   Final diagnoses:  BV (bacterial vaginosis)     Discharge Instructions     Take the antibiotic 2 times a day for 7 days Avoid sexual encounters while you are on the antibiotic  Take a probiotic for women once a day.  This may help reduce your BV    ED Prescriptions    Medication Sig Dispense Auth. Provider   metroNIDAZOLE (FLAGYL) 500 MG tablet Take 1 tablet (500 mg total) by mouth 2 (two) times daily. 14 tablet Eustace Moore, MD     PDMP not reviewed this encounter.   Eustace Moore, MD 03/07/20 [place]

## 2020-03-07 NOTE — ED Triage Notes (Signed)
Pt c/o foul vagina odor onset today. Denies abdom pain, n/v/d, fever, chills, or vag bleeding. Pt attributes vaginal odor to "bacterial infection" and states she gets them every month.  Pt also wants referral to PMD for chronic HA.

## 2020-03-07 NOTE — Discharge Instructions (Signed)
Take the antibiotic 2 times a day for 7 days Avoid sexual encounters while you are on the antibiotic  Take a probiotic for women once a day.  This may help reduce your BV

## 2020-03-23 ENCOUNTER — Ambulatory Visit (HOSPITAL_COMMUNITY)
Admission: EM | Admit: 2020-03-23 | Discharge: 2020-03-23 | Disposition: A | Discharge status: Home / Self Care | Payer: Medicaid Other | Attending: Family Medicine | Admitting: Family Medicine

## 2020-03-23 ENCOUNTER — Encounter (HOSPITAL_COMMUNITY): Payer: Self-pay | Admitting: Emergency Medicine

## 2020-03-23 ENCOUNTER — Other Ambulatory Visit: Payer: Self-pay

## 2020-03-23 DIAGNOSIS — N760 Acute vaginitis: Secondary | ICD-10-CM | POA: Diagnosis not present

## 2020-03-23 NOTE — Discharge Instructions (Signed)
We will call you with the results.  Please follow up if your symptoms fail to improve.

## 2020-03-23 NOTE — ED Provider Notes (Signed)
[facility]    CSN: [id number] Arrival date & time: 03/23/20  1732      History   Chief Complaint Chief Complaint  Patient presents with  . Vaginal Discharge    HPI Beverly Marquez is a 29 y.o. female. she is presenting with vaginal irritation. She has a history of bacterial vaginosis. This feels similar to that. Denies changes of STI or pregnancy. Symptoms have been ongoing for a few days.   HPI  Past Medical History:  Diagnosis Date  . No pertinent past medical history   . Trichomonas     Patient Active Problem List   Diagnosis Date Noted  . Contraception 09/12/2016    Past Surgical History:  Procedure Laterality Date  . NO PAST SURGERIES      OB History    Gravida  5   Para  3   Term  3   Preterm      AB  1   Living  3     SAB  1   TAB      Ectopic      Multiple      Live Births  3            Home Medications    Prior to Admission medications   Medication Sig Start Date End Date Taking? Authorizing Provider  metroNIDAZOLE (FLAGYL) 500 MG tablet Take 1 tablet (500 mg total) by mouth 2 (two) times daily. Patient not taking: Reported on 03/23/2020 03/07/20   Eustace Moore, MD    Family History Family History  Problem Relation Age of Onset  . Hypertension Mother   . Healthy Father     Social History Social History   Tobacco Use  . Smoking status: Former Smoker    Types: Cigarettes  . Smokeless tobacco: Never Used  Substance Use Topics  . Alcohol use: No  . Drug use: Yes    Types: Marijuana     Allergies   Patient has no known allergies.   Review of Systems Review of Systems  See HPI   Physical Exam Triage Vital Signs ED Triage Vitals [03/23/20 1748]  Enc Vitals Group     BP 101/69     Pulse Rate 89     Resp 18     Temp 99.9 F (37.7 C)     Temp Source Oral     SpO2 98 %     Weight      Height      Head Circumference      Peak Flow      Pain Score 0     Pain Loc      Pain Edu?    Excl. in GC?    No data found.  Updated Vital Signs BP 101/69 (BP Location: Right Arm)   Pulse 89   Temp 99.9 F (37.7 C) (Oral)   Resp 18   LMP 07/10/2019   SpO2 98%   Visual Acuity Right Eye Distance:   Left Eye Distance:   Bilateral Distance:    Right Eye Near:   Left Eye Near:    Bilateral Near:     Physical Exam Gen: NAD, alert, cooperative with exam, well-appearing ENT: normal lips, normal nasal mucosa,  Resp: no accessory muscle use, non-labored,   Skin: no rashes, no areas of induration  Neuro: normal tone, normal sensation to touch Psych:  normal insight, alert and oriented    UC Treatments / Results  Labs (all labs  ordered are listed, but only abnormal results are displayed) Labs Reviewed  CERVICOVAGINAL ANCILLARY ONLY    EKG   Radiology No results found.  Procedures Procedures (including critical care time)  Medications Ordered in UC Medications - No data to display  Initial Impression / Assessment and Plan / UC Course  I have reviewed the triage vital signs and the nursing notes.  Pertinent labs & imaging results that were available during my care of the patient were reviewed by me and considered in my medical decision making (see chart for details).     Ms. Speltz is a 29 yo F that is presenting with vaginal irritation. Has a history of BV. Cytology swab obtained. Counseled on supportive care. Given indications to follow up and return.   Final Clinical Impressions(s) / UC Diagnoses   Final diagnoses:  Acute vaginitis     Discharge Instructions     We will call you with the results.  Please follow up if your symptoms fail to improve.     ED Prescriptions    None     PDMP not reviewed this encounter.   Rosemarie Ax, MD 03/23/20 [phone]

## 2020-03-23 NOTE — ED Triage Notes (Signed)
Pt here for vaginal discharge; pt sts hx of BV

## 2020-03-24 ENCOUNTER — Telehealth (HOSPITAL_COMMUNITY): Payer: Self-pay | Admitting: Orthopedic Surgery

## 2020-03-24 LAB — CERVICOVAGINAL ANCILLARY ONLY
Bacterial Vaginitis (gardnerella): POSITIVE — AB
Candida Glabrata: NEGATIVE
Candida Vaginitis: POSITIVE — AB
Chlamydia: NEGATIVE
Comment: NEGATIVE
Comment: NEGATIVE
Comment: NEGATIVE
Comment: NEGATIVE
Comment: NEGATIVE
Comment: NORMAL
Neisseria Gonorrhea: NEGATIVE
Trichomonas: NEGATIVE

## 2020-03-24 MED ORDER — FLUCONAZOLE 150 MG PO TABS: 150.0000 mg | ORAL_TABLET | Freq: Every day | ORAL | 0 refills | Status: AC

## 2020-03-24 MED ORDER — METRONIDAZOLE 500 MG PO TABS: 500.0000 mg | ORAL_TABLET | Freq: Two times a day (BID) | ORAL | 0 refills | Status: DC

## 2020-04-07 ENCOUNTER — Encounter (HOSPITAL_COMMUNITY): Payer: Self-pay

## 2020-04-07 ENCOUNTER — Ambulatory Visit (HOSPITAL_COMMUNITY)
Admission: EM | Admit: 2020-04-07 | Discharge: 2020-04-07 | Disposition: A | Discharge status: Home / Self Care | Payer: Medicaid Other | Attending: Family Medicine | Admitting: Family Medicine

## 2020-04-07 ENCOUNTER — Other Ambulatory Visit: Payer: Self-pay

## 2020-04-07 DIAGNOSIS — R21 Rash and other nonspecific skin eruption: Secondary | ICD-10-CM | POA: Diagnosis not present

## 2020-04-07 MED ORDER — VALACYCLOVIR HCL 1 G PO TABS: 1000.0000 mg | ORAL_TABLET | Freq: Three times a day (TID) | ORAL | 0 refills | Status: AC

## 2020-04-07 NOTE — Discharge Instructions (Addendum)
Treating you for possible herpes.  We we will send the swab for testing and call you with any positive results

## 2020-04-07 NOTE — ED Provider Notes (Signed)
[facility]    CSN: [id number] Arrival date & time: 04/07/20  0803      History   Chief Complaint Chief Complaint  Patient presents with   SEXUALLY TRANSMITTED DISEASE    HPI Beverly Marquez is a 29 y.o. female.   Patient is a 29 year old female presents today for possible STD.  She is concerned due to to painful blisters in vaginal area one on the labia and one in clitoris area.  She noticed this yesterday.  Describes as painful and burning.  No history of similar.  Mild dysuria.  No abdominal pain, back pain, vaginal discharge or bleeding.  ROS per HPI      Past Medical History:  Diagnosis Date   No pertinent past medical history    Trichomonas     Patient Active Problem List   Diagnosis Date Noted   Contraception 09/12/2016    Past Surgical History:  Procedure Laterality Date   NO PAST SURGERIES      OB History    Gravida  5   Para  3   Term  3   Preterm      AB  1   Living  3     SAB  1   TAB      Ectopic      Multiple      Live Births  3            Home Medications    Prior to Admission medications   Medication Sig Start Date End Date Taking? Authorizing Provider  valACYclovir (VALTREX) 1000 MG tablet Take 1 tablet (1,000 mg total) by mouth 3 (three) times daily for 7 days. 04/07/20 04/14/20  Janace Aris, NP    Family History Family History  Problem Relation Age of Onset   Hypertension Mother    Healthy Father     Social History Social History   Tobacco Use   Smoking status: Former Smoker    Types: Cigarettes   Smokeless tobacco: Never Used  Building services engineer Use: Never used  Substance Use Topics   Alcohol use: No   Drug use: Yes    Types: Marijuana     Allergies   Patient has no known allergies.   Review of Systems Review of Systems   Physical Exam Triage Vital Signs ED Triage Vitals  Enc Vitals Group     BP 04/07/20 0834 (!) 105/54     Pulse Rate 04/07/20 0834 74      Resp 04/07/20 0834 14     Temp 04/07/20 0834 98 F (36.7 C)     Temp src --      SpO2 04/07/20 0834 100 %     Weight --      Height --      Head Circumference --      Peak Flow --      Pain Score 04/07/20 0833 4     Pain Loc --      Pain Edu? --      Excl. in GC? --    No data found.  Updated Vital Signs BP (!) 105/54    Pulse 74    Temp 98 F (36.7 C)    Resp 14    LMP 03/31/2020 (Within Days)    SpO2 100%   Visual Acuity Right Eye Distance:   Left Eye Distance:   Bilateral Distance:    Right Eye Near:   Left Eye Near:  Bilateral Near:     Physical Exam Vitals and nursing note reviewed.  Constitutional:      General: She is not in acute distress.    Appearance: Normal appearance. She is not ill-appearing, toxic-appearing or diaphoretic.  HENT:     Head: Normocephalic.     Nose: Nose normal.  Eyes:     Conjunctiva/sclera: Conjunctivae normal.  Pulmonary:     Effort: Pulmonary effort is normal.  Genitourinary:    Comments: Small open sore to the right upper labia minora and one ulcer to the bottom left lower labia minora.  Painful to touch. No discharge noted externally.  Musculoskeletal:        General: Normal range of motion.     Cervical back: Normal range of motion.  Skin:    General: Skin is warm and dry.  Neurological:     Mental Status: She is alert.  Psychiatric:        Mood and Affect: Mood normal.      UC Treatments / Results  Labs (all labs ordered are listed, but only abnormal results are displayed) Labs Reviewed  HSV CULTURE AND TYPING  CERVICOVAGINAL ANCILLARY ONLY    EKG   Radiology No results found.  Procedures Procedures (including critical care time)  Medications Ordered in UC Medications - No data to display  Initial Impression / Assessment and Plan / UC Course  I have reviewed the triage vital signs and the nursing notes.  Pertinent labs & imaging results that were available during my care of the patient were  reviewed by me and considered in my medical decision making (see chart for details).     Rash of genitalia There is some concern for HSV.  We we will send a swab for further testing. Treating with Valtrex Follow up as needed for continued or worsening symptoms  Final Clinical Impressions(s) / UC Diagnoses   Final diagnoses:  Rash of genitalia     Discharge Instructions     Treating you for possible herpes.  We we will send the swab for testing and call you with any positive results    ED Prescriptions    Medication Sig Dispense Auth. Provider   valACYclovir (VALTREX) 1000 MG tablet Take 1 tablet (1,000 mg total) by mouth 3 (three) times daily for 7 days. 21 tablet Andrena Margerum A, NP     PDMP not reviewed this encounter.   Loura Halt A, NP 04/07/20 1045

## 2020-04-07 NOTE — ED Triage Notes (Signed)
Patient reports she has two blisters on her vagina she noticed yesterday. One on her labia and another near her clitoris.

## 2020-04-08 ENCOUNTER — Telehealth (HOSPITAL_COMMUNITY): Payer: Self-pay | Admitting: Orthopedic Surgery

## 2020-04-08 LAB — CERVICOVAGINAL ANCILLARY ONLY
Bacterial Vaginitis (gardnerella): NEGATIVE
Candida Glabrata: NEGATIVE
Candida Vaginitis: NEGATIVE
Chlamydia: POSITIVE — AB
Comment: NEGATIVE
Comment: NEGATIVE
Comment: NEGATIVE
Comment: NEGATIVE
Comment: NEGATIVE
Comment: NORMAL
Neisseria Gonorrhea: NEGATIVE
Trichomonas: NEGATIVE

## 2020-04-08 MED ORDER — AZITHROMYCIN 250 MG PO TABS: 1000.0000 mg | ORAL_TABLET | Freq: Once | ORAL | 0 refills | Status: AC

## 2020-04-09 LAB — HSV CULTURE AND TYPING

## 2020-04-19 ENCOUNTER — Other Ambulatory Visit: Payer: Self-pay

## 2020-04-19 ENCOUNTER — Ambulatory Visit: Payer: Medicaid Other | Admitting: Obstetrics

## 2020-04-19 ENCOUNTER — Other Ambulatory Visit (HOSPITAL_COMMUNITY)
Admission: RE | Admit: 2020-04-19 | Discharge: 2020-04-19 | Disposition: A | Discharge status: Home / Self Care | Payer: Medicaid Other | Source: Ambulatory Visit | Attending: Obstetrics | Admitting: Obstetrics

## 2020-04-19 ENCOUNTER — Encounter: Payer: Self-pay | Admitting: Obstetrics

## 2020-04-19 VITALS — BP 113/68 | HR 68 | Ht 65.0 in | Wt 146.1 lb

## 2020-04-19 DIAGNOSIS — N898 Other specified noninflammatory disorders of vagina: Secondary | ICD-10-CM | POA: Diagnosis not present

## 2020-04-19 DIAGNOSIS — A6000 Herpesviral infection of urogenital system, unspecified: Secondary | ICD-10-CM

## 2020-04-19 DIAGNOSIS — Z113 Encounter for screening for infections with a predominantly sexual mode of transmission: Secondary | ICD-10-CM | POA: Insufficient documentation

## 2020-04-19 NOTE — Progress Notes (Signed)
Patient presents for STD testing. She also would like to have STD blood work.

## 2020-04-19 NOTE — Progress Notes (Signed)
Patient ID: Beverly Marquez, female   DOB: September 19, 1991, 29 y.o.   MRN: [id number]  Chief Complaint  Patient presents with  . Gynecologic Exam    HPI Beverly Marquez is a 29 y.o. female.  Complains of recent genital herpes outbreak.  HPI  Past Medical History:  Diagnosis Date  . No pertinent past medical history   . Trichomonas     Past Surgical History:  Procedure Laterality Date  . NO PAST SURGERIES      Family History  Problem Relation Age of Onset  . Hypertension Mother   . Healthy Father     Social History Social History   Tobacco Use  . Smoking status: Former Smoker    Types: Cigarettes  . Smokeless tobacco: Never Used  Vaping Use  . Vaping Use: Never used  Substance Use Topics  . Alcohol use: No  . Drug use: Yes    Types: Marijuana    No Known Allergies  No current outpatient medications on file.   No current facility-administered medications for this visit.    Review of Systems Review of Systems Constitutional: negative for fatigue and weight loss Respiratory: negative for cough and wheezing Cardiovascular: negative for chest pain, fatigue and palpitations Gastrointestinal: negative for abdominal pain and change in bowel habits Genitourinary:negative Integument/breast: negative for nipple discharge Musculoskeletal:negative for myalgias Neurological: negative for gait problems and tremors Behavioral/Psych: negative for abusive relationship, depression Endocrine: negative for temperature intolerance      Blood pressure 113/68, pulse 68, height 5\' 5"  (1.651 m), weight 146 lb 1.6 oz (66.3 kg), last menstrual period 03/31/2020, unknown if currently breastfeeding.  Physical Exam Physical Exam           General: Alert and no distress Abdomen:  normal findings: no organomegaly, soft, non-tender and no hernia  Pelvis:  External genitalia: normal general appearance Urinary system: urethral meatus normal and bladder without fullness,  nontender Vaginal: normal without tenderness, induration or masses Cervix: normal appearance Adnexa: normal bimanual exam Uterus: anteverted and non-tender, normal size    50% of 15 min visit spent on counseling and coordination of care.   Data Reviewed Wet Prep  Assessment     1. Screening for STD (sexually transmitted disease) RX: - Hepatitis B surface antigen - Hepatitis C antibody - HIV Antibody (routine testing w rflx) - RPR - Cervicovaginal ancillary only( [place])  2. Vaginal discharge Rx: - Cervicovaginal ancillary only( [place])  3. Genital herpes simplex, unspecified site - Valtrex prn    Plan  Follow up prn   Orders Placed This Encounter  Procedures  . Hepatitis B surface antigen  . Hepatitis C antibody  . HIV Antibody (routine testing w rflx)  . RPR      04/02/2020, MD 04/19/2020 11:25 AM

## 2020-04-20 LAB — CERVICOVAGINAL ANCILLARY ONLY
Bacterial Vaginitis (gardnerella): NEGATIVE
Candida Glabrata: NEGATIVE
Candida Vaginitis: POSITIVE — AB
Chlamydia: POSITIVE — AB
Comment: NEGATIVE
Comment: NEGATIVE
Comment: NEGATIVE
Comment: NEGATIVE
Comment: NEGATIVE
Comment: NORMAL
Neisseria Gonorrhea: NEGATIVE
Trichomonas: NEGATIVE

## 2020-04-20 LAB — HEPATITIS C ANTIBODY: Hep C Virus Ab: <0.1 s/co ratio (ref 0.0–0.9)

## 2020-04-20 LAB — HIV ANTIBODY (ROUTINE TESTING W REFLEX): HIV Screen 4th Generation wRfx: NONREACTIVE

## 2020-04-20 LAB — RPR: RPR Ser Ql: NONREACTIVE

## 2020-04-20 LAB — HEPATITIS B SURFACE ANTIGEN: Hepatitis B Surface Ag: NEGATIVE

## 2020-04-21 ENCOUNTER — Telehealth: Payer: Self-pay

## 2020-04-21 ENCOUNTER — Other Ambulatory Visit: Payer: Self-pay | Admitting: Obstetrics

## 2020-04-21 DIAGNOSIS — B3731 Acute candidiasis of vulva and vagina: Secondary | ICD-10-CM

## 2020-04-21 DIAGNOSIS — A562 Chlamydial infection of genitourinary tract, unspecified: Secondary | ICD-10-CM

## 2020-04-21 MED ORDER — DOXYCYCLINE HYCLATE 100 MG PO CAPS: 100.0000 mg | ORAL_CAPSULE | Freq: Two times a day (BID) | ORAL | 0 refills | Status: DC

## 2020-04-21 MED ORDER — FLUCONAZOLE 150 MG PO TABS: 150.0000 mg | ORAL_TABLET | Freq: Once | ORAL | 0 refills | Status: AC

## 2020-04-21 NOTE — Telephone Encounter (Signed)
Called to advise of results, no answer, left vm to call, form faxed to [facility]

## 2020-04-21 NOTE — Telephone Encounter (Signed)
Returned call and advised of results and rx.

## 2020-04-28 ENCOUNTER — Encounter (HOSPITAL_COMMUNITY): Payer: Self-pay

## 2020-04-28 ENCOUNTER — Ambulatory Visit (HOSPITAL_COMMUNITY)
Admission: EM | Admit: 2020-04-28 | Discharge: 2020-04-28 | Disposition: A | Discharge status: Home / Self Care | Payer: Medicaid Other | Attending: Internal Medicine | Admitting: Internal Medicine

## 2020-04-28 ENCOUNTER — Other Ambulatory Visit: Payer: Self-pay

## 2020-04-28 NOTE — ED Triage Notes (Signed)
Pt is here for STD testing after testing POSITIVE for Cylmidia 2 weeks ago, she finished her meds prescribed . Pt states her "vagina has a twitch".

## 2020-05-04 ENCOUNTER — Encounter (HOSPITAL_COMMUNITY): Payer: Self-pay

## 2020-05-04 ENCOUNTER — Ambulatory Visit (HOSPITAL_COMMUNITY)
Admission: EM | Admit: 2020-05-04 | Discharge: 2020-05-04 | Disposition: A | Discharge status: Home / Self Care | Payer: Medicaid Other | Attending: Family Medicine | Admitting: Family Medicine

## 2020-05-04 ENCOUNTER — Other Ambulatory Visit: Payer: Self-pay

## 2020-05-04 DIAGNOSIS — A749 Chlamydial infection, unspecified: Secondary | ICD-10-CM | POA: Diagnosis not present

## 2020-05-04 MED ORDER — AZITHROMYCIN 250 MG PO TABS: 1000.0000 mg | ORAL_TABLET | Freq: Once | ORAL | Status: DC

## 2020-05-04 NOTE — ED Provider Notes (Signed)
[facility]   [id number] 05/04/20 Arrival Time: 1808  ASSESSMENT & PLAN:  1. Chlamydia     Vaginal cytology sent for test of cure. Otherwise she is well.  Reviewed expectations re: course of current medical issues. Questions answered. Outlined signs and symptoms indicating need for more acute intervention. Patient verbalized understanding. After Visit Summary given.   SUBJECTIVE:  Beverly Marquez is a 29 y.o. female who was treated for chlamydia approx 2 weeks ago. Finished antibiotic. Here for test of cure. No symptoms.  Patient's last menstrual period was 04/20/2020.   OBJECTIVE:  Vitals:   05/04/20 1839  BP: 116/74  Pulse: 76  Resp: 18  Temp: 98 F (36.7 C)  TempSrc: Oral  SpO2: 98%     General appearance: alert, cooperative, appears stated age and no distress Lungs: unlabored respirations; speaks full sentences without difficulty GU: deferred Skin: warm and dry Psychological: alert and cooperative; normal mood and affect.  Investigations reviewed: Results for orders placed or performed in visit on 04/19/20  Hepatitis B surface antigen  Result Value Ref Range   Hepatitis B Surface Ag Negative Negative  Hepatitis C antibody  Result Value Ref Range   Hep C Virus Ab <0.1 0.0 - 0.9 s/co ratio  HIV Antibody (routine testing w rflx)  Result Value Ref Range   HIV Screen 4th Generation wRfx Non Reactive Non Reactive  RPR  Result Value Ref Range   RPR Ser Ql Non Reactive Non Reactive  Cervicovaginal ancillary only( [place])  Result Value Ref Range   Neisseria Gonorrhea Negative    Chlamydia Positive (A)    Trichomonas Negative    Bacterial Vaginitis (gardnerella) Negative    Candida Vaginitis Positive (A)    Candida Glabrata Negative    Comment      Normal Reference Range Bacterial Vaginosis - Negative   Comment Normal Reference Range Candida Species - Negative    Comment Normal Reference Range Candida Galbrata - Negative    Comment  Normal Reference Range Trichomonas - Negative    Comment Normal Reference Ranger Chlamydia - Negative    Comment      Normal Reference Range Neisseria Gonorrhea - Negative    Labs Reviewed - No data to display  No Known Allergies  Past Medical History:  Diagnosis Date  . No pertinent past medical history   . Trichomonas    Family History  Problem Relation Age of Onset  . Hypertension Mother   . Healthy Father    Social History   Socioeconomic History  . Marital status: Single    Spouse name: Not on file  . Number of children: 2  . Years of education: Not on file  . Highest education level: Not on file  Occupational History  . Not on file  Tobacco Use  . Smoking status: Former Smoker    Types: Cigarettes  . Smokeless tobacco: Never Used  Vaping Use  . Vaping Use: Never used  Substance and Sexual Activity  . Alcohol use: No  . Drug use: Yes    Types: Marijuana  . Sexual activity: Yes    Partners: Male    Birth control/protection: None  Other Topics Concern  . Not on file  Social History Narrative  . Not on file   Social Determinants of Health   Financial Resource Strain:   . Difficulty of Paying Living Expenses:   Food Insecurity:   . Worried About Programme researcher, broadcasting/film/video in the Last Year:   .  Ran Out of Food in the Last Year:   Transportation Needs:   . Freight forwarder (Medical):   Marland Kitchen Lack of Transportation (Non-Medical):   Physical Activity:   . Days of Exercise per Week:   . Minutes of Exercise per Session:   Stress:   . Feeling of Stress :   Social Connections:   . Frequency of Communication with Friends and Family:   . Frequency of Social Gatherings with Friends and Family:   . Attends Religious Services:   . Active Member of Clubs or Organizations:   . Attends Banker Meetings:   Marland Kitchen Marital Status:   Intimate Partner Violence:   . Fear of Current or Ex-Partner:   . Emotionally Abused:   Marland Kitchen Physically Abused:   . Sexually  Abused:           Mardella Layman, MD 05/04/20 [phone]

## 2020-05-04 NOTE — ED Triage Notes (Signed)
Pt presents for STD's testing. States she tested positive for Chlamydia 2 weeks ago and did nit have the treatment as she left before be seen. Pt denies any symptoms.

## 2020-05-05 ENCOUNTER — Ambulatory Visit (HOSPITAL_COMMUNITY)
Admission: EM | Admit: 2020-05-05 | Discharge: 2020-05-05 | Disposition: A | Discharge status: Home / Self Care | Payer: Medicaid Other | Attending: Emergency Medicine | Admitting: Emergency Medicine

## 2020-05-05 ENCOUNTER — Encounter (HOSPITAL_COMMUNITY): Payer: Self-pay

## 2020-05-05 ENCOUNTER — Other Ambulatory Visit: Payer: Self-pay

## 2020-05-05 DIAGNOSIS — N760 Acute vaginitis: Secondary | ICD-10-CM | POA: Insufficient documentation

## 2020-05-05 DIAGNOSIS — B9689 Other specified bacterial agents as the cause of diseases classified elsewhere: Secondary | ICD-10-CM | POA: Insufficient documentation

## 2020-05-05 LAB — CERVICOVAGINAL ANCILLARY ONLY
Chlamydia: NEGATIVE
Comment: NEGATIVE
Comment: NORMAL
Neisseria Gonorrhea: NEGATIVE

## 2020-05-05 MED ORDER — METRONIDAZOLE 500 MG PO TABS: 500.0000 mg | ORAL_TABLET | Freq: Two times a day (BID) | ORAL | 0 refills | Status: AC

## 2020-05-05 NOTE — ED Provider Notes (Signed)
HPI  SUBJECTIVE:  Beverly Marquez is a 29 y.o. female who presents with odorous "fishy" vaginal discharge starting yesterday.  She states this is identical to previous episodes of BV and is fairly certain that this is the cause of her symptoms today.   No labial itching, rash, vomiting, fevers, abdominal, back, pelvic pain.  She states that she has not been sexually active since 7/6 when she was found to have chlamydia and yeast.  She was treated for chlamydia and finished those antibiotics.  She was here yesterday for repeat gonorrhea/chlamydia labs as proof of cure.  They were both negative.  She was asymptomatic.  There are no aggravating or alleviating factors.  She has not tried anything for this.  She states Flagyl usually works well for her.  She has a past medical history of chlamydia, HSV 1, trichomonas, BV, yeast.  Denies history of gonorrhea, HIV, syphilis, diabetes.  LMP 7/7.  PMD: Dr. Clearance Coots.   Patient was seen here yesterday for test of cure.  She was treated for chlamydia 2 weeks ago finished antibiotics.  She was asymptomatic.  Labs from 7/6: Hep C, HIV, RPR negative.  She had chlamydia and yeast.  No BV, trichomonas .   Past Medical History:  Diagnosis Date  . No pertinent past medical history   . Trichomonas     Past Surgical History:  Procedure Laterality Date  . NO PAST SURGERIES      Family History  Problem Relation Age of Onset  . Hypertension Mother   . Healthy Father     Social History   Tobacco Use  . Smoking status: Former Smoker    Types: Cigarettes  . Smokeless tobacco: Never Used  Vaping Use  . Vaping Use: Never used  Substance Use Topics  . Alcohol use: No  . Drug use: Yes    Types: Marijuana    No current facility-administered medications for this encounter.  Current Outpatient Medications:  .  metroNIDAZOLE (FLAGYL) 500 MG tablet, Take 1 tablet (500 mg total) by mouth 2 (two) times daily for 7 days., Disp: 14 tablet, Rfl: 0  No Known  Allergies   ROS  As noted in HPI.   Physical Exam  BP 103/68 (BP Location: Right Arm)   Pulse 63   Temp 98 F (36.7 C) (Oral)   Resp 18   LMP 04/20/2020   SpO2 95%   Constitutional: Well developed, well nourished, no acute distress Eyes:  EOMI, conjunctiva normal bilaterally HENT: Normocephalic, atraumatic,mucus membranes moist Respiratory: Normal inspiratory effort Cardiovascular: Normal rate GI: nondistended  skin: No rash, skin intact Musculoskeletal: no deformities Neurologic: Alert & oriented x 3, no focal neuro deficits Psychiatric: Speech and behavior appropriate   ED Course   Medications - No data to display  No orders of the defined types were placed in this encounter.   Results for orders placed or performed during the hospital encounter of 05/04/20 (from the past 24 hour(s))  Cervicovaginal ancillary only     Status: None   Collection Time: 05/04/20  6:58 PM  Result Value Ref Range   Neisseria Gonorrhea Negative    Chlamydia Negative    Comment Normal Reference Ranger Chlamydia - Negative    Comment      Normal Reference Range Neisseria Gonorrhea - Negative   No results found.  ED Clinical Impression  1. BV (bacterial vaginosis)      ED Assessment/Plan  History consistent with recurrent BV.  She just finished menses.  Will repeat test for BV.  She states that she has not been sexually active since 7/6, so will not check for trichomonas.  Flagyl will cover trichomonas infection.  She has no symptoms concerning for yeast, so we will not test or treat that.  Follow-up with OB/GYN as needed.  Meds ordered this encounter  Medications  . metroNIDAZOLE (FLAGYL) 500 MG tablet    Sig: Take 1 tablet (500 mg total) by mouth 2 (two) times daily for 7 days.    Dispense:  14 tablet    Refill:  0    *This clinic note was created using Scientist, clinical (histocompatibility and immunogenetics). Therefore, there may be occasional mistakes despite careful proofreading.  ?      Domenick Gong, MD 05/06/20 1432

## 2020-05-05 NOTE — Discharge Instructions (Addendum)
Finish the Flagyl, even if you feel better.  Using condoms may help reduce the risk of recurrence.  If you keep having frequent flares, follow-up with Dr. Clearance Coots.

## 2020-05-05 NOTE — ED Triage Notes (Signed)
Pt presents with recurrent vaginal discharge and vaginal odor.

## 2020-05-06 LAB — CERVICOVAGINAL ANCILLARY ONLY
Bacterial Vaginitis (gardnerella): POSITIVE — AB
Comment: NEGATIVE

## 2020-05-19 ENCOUNTER — Ambulatory Visit (HOSPITAL_COMMUNITY)
Admission: RE | Admit: 2020-05-19 | Discharge: 2020-05-19 | Disposition: A | Discharge status: Home / Self Care | Payer: Medicaid Other | Source: Ambulatory Visit | Attending: Urgent Care | Admitting: Urgent Care

## 2020-05-19 ENCOUNTER — Encounter (HOSPITAL_COMMUNITY): Payer: Self-pay

## 2020-05-19 ENCOUNTER — Other Ambulatory Visit: Payer: Self-pay

## 2020-05-19 VITALS — BP 104/63 | HR 85 | Temp 99.1°F | Resp 18

## 2020-05-19 DIAGNOSIS — N760 Acute vaginitis: Secondary | ICD-10-CM

## 2020-05-19 DIAGNOSIS — Z760 Encounter for issue of repeat prescription: Secondary | ICD-10-CM

## 2020-05-19 DIAGNOSIS — Z3202 Encounter for pregnancy test, result negative: Secondary | ICD-10-CM

## 2020-05-19 DIAGNOSIS — N898 Other specified noninflammatory disorders of vagina: Secondary | ICD-10-CM

## 2020-05-19 LAB — POC URINE PREG, ED: Preg Test, Ur: NEGATIVE

## 2020-05-19 MED ORDER — FLUCONAZOLE 150 MG PO TABS
150.0000 mg | ORAL_TABLET | ORAL | 0 refills | Status: DC
Start: 2020-05-19 — End: 2020-07-22

## 2020-05-19 MED ORDER — VALACYCLOVIR HCL 500 MG PO TABS: ORAL_TABLET | ORAL | 5 refills | Status: DC

## 2020-05-19 NOTE — ED Triage Notes (Signed)
Pt presents for medication refill of valtrex for genital herpes outbreak.

## 2020-05-19 NOTE — ED Provider Notes (Signed)
[facility]   MRN: [id number] DOB: 09-Feb-1991  Subjective:   Beverly Marquez is a 29 y.o. female presenting for 1 day hx of recurrent herpes outbreak. Needs Valtrex refill. She also has vaginal itching. States that she is on her cycle now. Would like STI testing. Denies pelvic pains, abd pain, n/v, dysuria, urinary freq.  No current facility-administered medications for this encounter. No current outpatient medications on file.   No Known Allergies  Past Medical History:  Diagnosis Date  . No pertinent past medical history   . Trichomonas      Past Surgical History:  Procedure Laterality Date  . NO PAST SURGERIES      Family History  Problem Relation Age of Onset  . Hypertension Mother   . Healthy Father     Social History   Tobacco Use  . Smoking status: Former Smoker    Types: Cigarettes  . Smokeless tobacco: Never Used  Vaping Use  . Vaping Use: Never used  Substance Use Topics  . Alcohol use: No  . Drug use: Yes    Types: Marijuana    ROS   Objective:   Vitals: BP 104/63 (BP Location: Left Arm)   Pulse 85   Temp 99.1 F (37.3 C) (Oral)   Resp 18   LMP 05/15/2020   SpO2 97%   Physical Exam Constitutional:      General: She is not in acute distress.    Appearance: Normal appearance. She is well-developed. She is not ill-appearing, toxic-appearing or diaphoretic.  HENT:     Head: Normocephalic and atraumatic.     Nose: Nose normal.     Mouth/Throat:     Mouth: Mucous membranes are moist.     Pharynx: Oropharynx is clear.  Eyes:     General: No scleral icterus.    Extraocular Movements: Extraocular movements intact.     Pupils: Pupils are equal, round, and reactive to light.  Cardiovascular:     Rate and Rhythm: Normal rate.  Pulmonary:     Effort: Pulmonary effort is normal.  Abdominal:     General: Bowel sounds are normal. There is no distension.     Palpations: Abdomen is soft. There is no mass.     Tenderness: There is  no abdominal tenderness. There is no right CVA tenderness, left CVA tenderness, guarding or rebound.  Skin:    General: Skin is warm and dry.  Neurological:     General: No focal deficit present.     Mental Status: She is alert and oriented to person, place, and time.  Psychiatric:        Mood and Affect: Mood normal.        Behavior: Behavior normal.        Thought Content: Thought content normal.        Judgment: Judgment normal.     Results for orders placed or performed during the hospital encounter of 05/19/20 (from the past 24 hour(s))  POC urine pregnancy     Status: None   Collection Time: 05/19/20 12:46 PM  Result Value Ref Range   Preg Test, Ur NEGATIVE NEGATIVE    Assessment and Plan :   PDMP not reviewed this encounter.  1. Vaginal itching   2. Encounter for medication refill   3. Acute vaginitis     Patient did not want to wait for urine preg test results. Refilled Valtrex. Rx for diflucan for yeast vaginitis as she recently took an antibiotic and has  sx. Will tx based off of lab results otherwise. Counseled patient on potential for adverse effects with medications prescribed/recommended today, ER and return-to-clinic precautions discussed, patient verbalized understanding.    Wallis Bamberg, PA-C 05/19/20 1248

## 2020-05-20 ENCOUNTER — Telehealth (HOSPITAL_COMMUNITY): Payer: Self-pay

## 2020-05-20 LAB — CERVICOVAGINAL ANCILLARY ONLY
Bacterial Vaginitis (gardnerella): POSITIVE — AB
Candida Glabrata: NEGATIVE
Candida Vaginitis: NEGATIVE
Chlamydia: NEGATIVE
Comment: NEGATIVE
Comment: NEGATIVE
Comment: NEGATIVE
Comment: NEGATIVE
Comment: NEGATIVE
Comment: NORMAL
Neisseria Gonorrhea: NEGATIVE
Trichomonas: NEGATIVE

## 2020-05-20 MED ORDER — METRONIDAZOLE 500 MG PO TABS: 500.0000 mg | ORAL_TABLET | Freq: Two times a day (BID) | ORAL | 0 refills | Status: DC

## 2020-06-10 ENCOUNTER — Encounter (HOSPITAL_COMMUNITY): Payer: Self-pay

## 2020-06-10 ENCOUNTER — Other Ambulatory Visit: Payer: Self-pay

## 2020-06-10 ENCOUNTER — Ambulatory Visit (HOSPITAL_COMMUNITY)
Admission: RE | Admit: 2020-06-10 | Discharge: 2020-06-10 | Disposition: A | Discharge status: Home / Self Care | Payer: Medicaid Other | Source: Ambulatory Visit | Attending: Family Medicine | Admitting: Family Medicine

## 2020-06-10 VITALS — BP 103/67 | HR 67 | Temp 98.3°F | Resp 16

## 2020-06-10 DIAGNOSIS — B9689 Other specified bacterial agents as the cause of diseases classified elsewhere: Secondary | ICD-10-CM | POA: Diagnosis not present

## 2020-06-10 DIAGNOSIS — N760 Acute vaginitis: Secondary | ICD-10-CM

## 2020-06-10 MED ORDER — METRONIDAZOLE 500 MG PO TABS: 500.0000 mg | ORAL_TABLET | Freq: Two times a day (BID) | ORAL | 0 refills | Status: DC

## 2020-06-10 NOTE — ED Triage Notes (Signed)
Pt presents for STD's testing. Pt states having a fishy smell in her vagina, denies any vaginal discharge.

## 2020-06-10 NOTE — Discharge Instructions (Addendum)
Treating you for recurrent BV Recommend follow-up with specialist for this.

## 2020-06-13 NOTE — ED Provider Notes (Signed)
Renaldo Fiddler    CSN: [id number] Arrival date & time: 06/10/20  5009      History   Chief Complaint Chief Complaint  Patient presents with  . Appointment    1000  . STD test    HPI Tayli Pincus is a 29 y.o. female.   Patient is a 29 year old female presents today for vaginal discharge.  History of recurrent BV and feels the same.  No abdominal pain, back pain, fevers     History reviewed. No pertinent past medical history.  There are no problems to display for this patient.   History reviewed. No pertinent surgical history.  OB History   No obstetric history on file.      Home Medications    Prior to Admission medications   Medication Sig Start Date End Date Taking? Authorizing Provider  metroNIDAZOLE (FLAGYL) 500 MG tablet Take 1 tablet (500 mg total) by mouth 2 (two) times daily. 06/10/20   Janace Aris, NP    Family History History reviewed. No pertinent family history.  Social History Social History   Tobacco Use  . Smoking status: Never Smoker  . Smokeless tobacco: Never Used  Substance Use Topics  . Alcohol use: Yes  . Drug use: Not on file     Allergies   Patient has no known allergies.   Review of Systems Review of Systems   Physical Exam Triage Vital Signs ED Triage Vitals  Enc Vitals Group     BP 06/10/20 1042 103/67     Pulse Rate 06/10/20 1042 67     Resp 06/10/20 1042 16     Temp 06/10/20 1042 98.3 F (36.8 C)     Temp Source 06/10/20 1042 Oral     SpO2 06/10/20 1042 100 %     Weight --      Height --      Head Circumference --      Peak Flow --      Pain Score 06/10/20 1041 0     Pain Loc --      Pain Edu? --      Excl. in GC? --    No data found.  Updated Vital Signs BP 103/67 (BP Location: Right Arm)   Pulse 67   Temp 98.3 F (36.8 C) (Oral)   Resp 16   LMP 05/16/2020 (Exact Date)   SpO2 100%   Visual Acuity Right Eye Distance:   Left Eye Distance:   Bilateral Distance:    Right Eye  Near:   Left Eye Near:    Bilateral Near:     Physical Exam Vitals and nursing note reviewed.  Constitutional:      General: She is not in acute distress.    Appearance: Normal appearance. She is not ill-appearing, toxic-appearing or diaphoretic.  HENT:     Head: Normocephalic.     Nose: Nose normal.  Eyes:     Conjunctiva/sclera: Conjunctivae normal.  Pulmonary:     Effort: Pulmonary effort is normal.  Musculoskeletal:        General: Normal range of motion.     Cervical back: Normal range of motion.  Skin:    General: Skin is warm and dry.     Findings: No rash.  Neurological:     Mental Status: She is alert.  Psychiatric:        Mood and Affect: Mood normal.      UC Treatments / Results  Labs (all labs ordered are listed,  but only abnormal results are displayed) Labs Reviewed - No data to display  EKG   Radiology No results found.  Procedures Procedures (including critical care time)  Medications Ordered in UC Medications - No data to display  Initial Impression / Assessment and Plan / UC Course  I have reviewed the triage vital signs and the nursing notes.  Pertinent labs & imaging results that were available during my care of the patient were reviewed by me and considered in my medical decision making (see chart for details).     BV  Recurrent BV Treated with Flagyl No need for testing today patient has recently been STD screening. Follow up as needed for continued or worsening symptoms  Final Clinical Impressions(s) / UC Diagnoses   Final diagnoses:  Bacterial vaginosis     Discharge Instructions     Treating you for recurrent BV Recommend follow-up with specialist for this.     ED Prescriptions    Medication Sig Dispense Auth. Provider   metroNIDAZOLE (FLAGYL) 500 MG tablet Take 1 tablet (500 mg total) by mouth 2 (two) times daily. 14 tablet Jenean Escandon A, NP     PDMP not reviewed this encounter.   Janace Aris, NP 06/13/20  [phone]

## 2020-06-14 ENCOUNTER — Encounter (HOSPITAL_COMMUNITY): Payer: Self-pay

## 2020-07-22 ENCOUNTER — Ambulatory Visit (HOSPITAL_COMMUNITY)
Admission: EM | Admit: 2020-07-22 | Discharge: 2020-07-22 | Disposition: A | Discharge status: Home / Self Care | Payer: Medicaid Other | Attending: Family Medicine | Admitting: Family Medicine

## 2020-07-22 ENCOUNTER — Other Ambulatory Visit: Payer: Self-pay

## 2020-07-22 ENCOUNTER — Encounter (HOSPITAL_COMMUNITY): Payer: Self-pay | Admitting: *Deleted

## 2020-07-22 DIAGNOSIS — N898 Other specified noninflammatory disorders of vagina: Secondary | ICD-10-CM

## 2020-07-22 MED ORDER — METRONIDAZOLE 500 MG PO TABS
500.0000 mg | ORAL_TABLET | Freq: Two times a day (BID) | ORAL | 0 refills | Status: DC
Start: 2020-07-22 — End: 2020-08-08

## 2020-07-22 NOTE — ED Triage Notes (Signed)
Pt reports Vag DC started yesterday with a Hx of BV.

## 2020-07-25 NOTE — ED Provider Notes (Signed)
[facility]    CSN: [id number] Arrival date & time: 07/22/20  1147      History   Chief Complaint Chief Complaint  Patient presents with  . Vaginal Discharge    HPI Beverly Marquez is a 29 y.o. female.   Patient is a 29 year old female who presents today with vaginal discharge that started yesterday.  This is similar to previous bacterial infections.  Denies any concern for STDs at this time.  No abdominal pain, dysuria, hematuria urinary frequency.     Past Medical History:  Diagnosis Date  . No pertinent past medical history   . Trichomonas     Patient Active Problem List   Diagnosis Date Noted  . Contraception 09/12/2016    Past Surgical History:  Procedure Laterality Date  . NO PAST SURGERIES      OB History    Gravida  5   Para  3   Term  3   Preterm  0   AB  2   Living  3     SAB  1   TAB  1   Ectopic  0   Multiple      Live Births  3            Home Medications    Prior to Admission medications   Medication Sig Start Date End Date Taking? Authorizing Provider  valACYclovir (VALTREX) 500 MG tablet At the start of an outbreak, take 1 tablet twice daily for 3 days. 05/19/20  Yes Wallis Bamberg, PA-C  metroNIDAZOLE (FLAGYL) 500 MG tablet Take 1 tablet (500 mg total) by mouth 2 (two) times daily. 07/22/20   Janace Aris, NP    Family History Family History  Problem Relation Age of Onset  . Hypertension Mother   . Healthy Father     Social History Social History   Tobacco Use  . Smoking status: Never Smoker  . Smokeless tobacco: Never Used  Vaping Use  . Vaping Use: Never used  Substance Use Topics  . Alcohol use: Yes  . Drug use: Yes    Types: Marijuana     Allergies   Patient has no known allergies.   Review of Systems Review of Systems   Physical Exam Triage Vital Signs ED Triage Vitals  Enc Vitals Group     BP 07/22/20 1229 (!) 105/53     Pulse Rate 07/22/20 1229 80     Resp 07/22/20 1229  15     Temp 07/22/20 1229 98.9 F (37.2 C)     Temp Source 07/22/20 1229 Oral     SpO2 07/22/20 1229 97 %     Weight 07/22/20 1223 144 lb (65.3 kg)     Height 07/22/20 1223 5\' 5"  (1.651 m)     Head Circumference --      Peak Flow --      Pain Score 07/22/20 1251 0     Pain Loc --      Pain Edu? --      Excl. in GC? --    No data found.  Updated Vital Signs BP (!) 105/53 (BP Location: Right Arm)   Pulse 80   Temp 98.9 F (37.2 C) (Oral)   Resp 15   Ht 5\' 5"  (1.651 m)   Wt 144 lb (65.3 kg)   LMP 07/09/2020   SpO2 97%   BMI 23.96 kg/m   Visual Acuity Right Eye Distance:   Left Eye Distance:   Bilateral  Distance:    Right Eye Near:   Left Eye Near:    Bilateral Near:     Physical Exam Vitals and nursing note reviewed.  Constitutional:      General: She is not in acute distress.    Appearance: Normal appearance. She is not ill-appearing, toxic-appearing or diaphoretic.  HENT:     Head: Normocephalic.     Nose: Nose normal.  Eyes:     Conjunctiva/sclera: Conjunctivae normal.  Pulmonary:     Effort: Pulmonary effort is normal.  Abdominal:     Palpations: Abdomen is soft.  Musculoskeletal:        General: Normal range of motion.     Cervical back: Normal range of motion.  Skin:    General: Skin is warm and dry.     Findings: No rash.  Neurological:     Mental Status: She is alert.  Psychiatric:        Mood and Affect: Mood normal.      UC Treatments / Results  Labs (all labs ordered are listed, but only abnormal results are displayed) Labs Reviewed - No data to display  EKG   Radiology No results found.  Procedures Procedures (including critical care time)  Medications Ordered in UC Medications - No data to display  Initial Impression / Assessment and Plan / UC Course  I have reviewed the triage vital signs and the nursing notes.  Pertinent labs & imaging results that were available during my care of the patient were reviewed by me and  considered in my medical decision making (see chart for details).     Vaginal discharge Patient with chronic recurrent BV. Treating with Flagyl Recommend follow-up with OB GYN Final Clinical Impressions(s) / UC Diagnoses   Final diagnoses:  Vaginal discharge   Discharge Instructions   None    ED Prescriptions    Medication Sig Dispense Auth. Provider   metroNIDAZOLE (FLAGYL) 500 MG tablet Take 1 tablet (500 mg total) by mouth 2 (two) times daily. 14 tablet Amica Harron A, NP     PDMP not reviewed this encounter.   Janace Aris, NP 07/25/20 [phone]

## 2020-08-05 ENCOUNTER — Ambulatory Visit (HOSPITAL_COMMUNITY): Payer: Self-pay

## 2020-08-08 ENCOUNTER — Encounter (HOSPITAL_COMMUNITY): Payer: Self-pay

## 2020-08-08 ENCOUNTER — Other Ambulatory Visit: Payer: Self-pay

## 2020-08-08 ENCOUNTER — Ambulatory Visit (HOSPITAL_COMMUNITY)
Admission: RE | Admit: 2020-08-08 | Discharge: 2020-08-08 | Disposition: A | Discharge status: Home / Self Care | Payer: Medicaid Other | Source: Ambulatory Visit | Attending: Emergency Medicine | Admitting: Emergency Medicine

## 2020-08-08 VITALS — BP 111/72 | HR 79 | Temp 98.4°F | Resp 16

## 2020-08-08 DIAGNOSIS — Z113 Encounter for screening for infections with a predominantly sexual mode of transmission: Secondary | ICD-10-CM | POA: Insufficient documentation

## 2020-08-08 DIAGNOSIS — Z3202 Encounter for pregnancy test, result negative: Secondary | ICD-10-CM | POA: Diagnosis not present

## 2020-08-08 LAB — HIV ANTIBODY (ROUTINE TESTING W REFLEX): HIV Screen 4th Generation wRfx: NONREACTIVE

## 2020-08-08 LAB — POC URINE PREG, ED: Preg Test, Ur: NEGATIVE

## 2020-08-08 NOTE — ED Provider Notes (Signed)
[facility]    CSN: [id number] Arrival date & time: 08/08/20  1249      History   Chief Complaint Chief Complaint  Patient presents with  . Appointment    1300  . STD test    HPI Beverly Marquez is a 29 y.o. female presenting today for STD screening.  She denies any new symptoms or known exposures.  Last menstrual cycle ended around 10/17.  Is not on birth control.  HPI  Past Medical History:  Diagnosis Date  . No pertinent past medical history   . Trichomonas     Patient Active Problem List   Diagnosis Date Noted  . Contraception 09/12/2016    Past Surgical History:  Procedure Laterality Date  . NO PAST SURGERIES      OB History    Gravida  5   Para  3   Term  3   Preterm  0   AB  2   Living  3     SAB  1   TAB  1   Ectopic  0   Multiple      Live Births  3            Home Medications    Prior to Admission medications   Not on File    Family History Family History  Problem Relation Age of Onset  . Hypertension Mother   . Healthy Father     Social History Social History   Tobacco Use  . Smoking status: Never Smoker  . Smokeless tobacco: Never Used  Vaping Use  . Vaping Use: Never used  Substance Use Topics  . Alcohol use: Yes  . Drug use: Yes    Types: Marijuana     Allergies   Patient has no known allergies.   Review of Systems Review of Systems  Constitutional: Negative for fever.  Respiratory: Negative for shortness of breath.   Cardiovascular: Negative for chest pain.  Gastrointestinal: Negative for abdominal pain, diarrhea, nausea and vomiting.  Genitourinary: Negative for dysuria, flank pain, genital sores, hematuria, menstrual problem, vaginal bleeding, vaginal discharge and vaginal pain.  Musculoskeletal: Negative for back pain.  Skin: Negative for rash.  Neurological: Negative for dizziness, light-headedness and headaches.     Physical Exam Triage Vital Signs ED Triage Vitals    Enc Vitals Group     BP 08/08/20 1313 111/72     Pulse Rate 08/08/20 1313 79     Resp 08/08/20 1313 16     Temp 08/08/20 1313 98.4 F (36.9 C)     Temp Source 08/08/20 1313 Oral     SpO2 08/08/20 1313 98 %     Weight --      Height --      Head Circumference --      Peak Flow --      Pain Score 08/08/20 1312 0     Pain Loc --      Pain Edu? --      Excl. in GC? --    No data found.  Updated Vital Signs BP 111/72 (BP Location: Left Arm)   Pulse 79   Temp 98.4 F (36.9 C) (Oral)   Resp 16   LMP 07/31/2020 (Exact Date)   SpO2 98%   Visual Acuity Right Eye Distance:   Left Eye Distance:   Bilateral Distance:    Right Eye Near:   Left Eye Near:    Bilateral Near:     Physical  Exam Vitals and nursing note reviewed.  Constitutional:      Appearance: She is well-developed.     Comments: No acute distress  HENT:     Head: Normocephalic and atraumatic.     Nose: Nose normal.  Eyes:     Conjunctiva/sclera: Conjunctivae normal.  Cardiovascular:     Rate and Rhythm: Normal rate.  Pulmonary:     Effort: Pulmonary effort is normal. No respiratory distress.  Abdominal:     General: There is no distension.  Musculoskeletal:        General: Normal range of motion.     Cervical back: Neck supple.  Skin:    General: Skin is warm and dry.  Neurological:     Mental Status: She is alert and oriented to person, place, and time.      UC Treatments / Results  Labs (all labs ordered are listed, but only abnormal results are displayed) Labs Reviewed  HIV ANTIBODY (ROUTINE TESTING W REFLEX)  RPR  POC URINE PREG, ED  CERVICOVAGINAL ANCILLARY ONLY    EKG   Radiology No results found.  Procedures Procedures (including critical care time)  Medications Ordered in UC Medications - No data to display  Initial Impression / Assessment and Plan / UC Course  I have reviewed the triage vital signs and the nursing notes.  Pertinent labs & imaging results that were  available during my care of the patient were reviewed by me and considered in my medical decision making (see chart for details).     Screening for STDs.  Vaginal swab pending for gonorrhea chlamydia and trichomonas, blood work pending for HIV and syphilis.  Will call with results and provide treatment as needed.  Deferring empiric treatment given asymptomatic and no known exposures.  Discussed strict return precautions. Patient verbalized understanding and is agreeable with plan.  Final Clinical Impressions(s) / UC Diagnoses   Final diagnoses:  Screen for STD (sexually transmitted disease)     Discharge Instructions     We are testing you for HIV, Syphillis, Gonorrhea, Chlamydia, Trichomonas, Yeast and Bacterial Vaginosis. We will call you if anything is positive and let you know if you require any further treatment. Please inform partners of any positive results.   Please return if symptoms not improving with treatment, development of fever, nausea, vomiting, abdominal pain.    ED Prescriptions    None     PDMP not reviewed this encounter.   Lew Dawes, [place] 08/08/20 1347

## 2020-08-08 NOTE — ED Triage Notes (Signed)
Pt presents for STD's testing. Denies dysuria, vaginal discharge or ay other symptoms.

## 2020-08-08 NOTE — Discharge Instructions (Signed)
We are testing you for HIV, Syphillis, Gonorrhea, Chlamydia, Trichomonas, Yeast and Bacterial Vaginosis. We will call you if anything is positive and let you know if you require any further treatment. Please inform partners of any positive results.   Please return if symptoms not improving with treatment, development of fever, nausea, vomiting, abdominal pain.  

## 2020-08-09 LAB — CERVICOVAGINAL ANCILLARY ONLY
Bacterial Vaginitis (gardnerella): NEGATIVE
Candida Glabrata: NEGATIVE
Candida Vaginitis: POSITIVE — AB
Chlamydia: NEGATIVE
Comment: NEGATIVE
Comment: NEGATIVE
Comment: NEGATIVE
Comment: NEGATIVE
Comment: NEGATIVE
Comment: NORMAL
Neisseria Gonorrhea: POSITIVE — AB
Trichomonas: NEGATIVE

## 2020-08-09 LAB — RPR: RPR Ser Ql: NONREACTIVE

## 2020-08-10 ENCOUNTER — Telehealth (HOSPITAL_COMMUNITY): Payer: Self-pay | Admitting: Emergency Medicine

## 2020-08-10 ENCOUNTER — Other Ambulatory Visit: Payer: Self-pay

## 2020-08-10 ENCOUNTER — Ambulatory Visit (HOSPITAL_COMMUNITY)
Admission: EM | Admit: 2020-08-10 | Discharge: 2020-08-10 | Disposition: A | Discharge status: Home / Self Care | Payer: Medicaid Other | Attending: Family Medicine | Admitting: Family Medicine

## 2020-08-10 ENCOUNTER — Encounter (HOSPITAL_COMMUNITY): Payer: Self-pay | Admitting: Emergency Medicine

## 2020-08-10 DIAGNOSIS — A549 Gonococcal infection, unspecified: Secondary | ICD-10-CM | POA: Diagnosis not present

## 2020-08-10 MED ORDER — LIDOCAINE HCL (PF) 1 % IJ SOLN
INTRAMUSCULAR | Status: AC
  Filled 2020-08-10: qty 2

## 2020-08-10 MED ORDER — CEFTRIAXONE SODIUM 500 MG IJ SOLR
500.0000 mg | Freq: Once | INTRAMUSCULAR | Status: AC
  Administered 2020-08-10: 500 mg via INTRAMUSCULAR

## 2020-08-10 MED ORDER — CEFTRIAXONE SODIUM 500 MG IJ SOLR
INTRAMUSCULAR | Status: AC
  Filled 2020-08-10: qty 500

## 2020-08-10 MED ORDER — FLUCONAZOLE 150 MG PO TABS
150.0000 mg | ORAL_TABLET | Freq: Once | ORAL | 0 refills | Status: AC
Start: 2020-08-10 — End: 2020-08-10

## 2020-08-10 NOTE — ED Triage Notes (Signed)
Patient presents for treatment for STD. Provider notified. She is aware to not have sex for 7 days after this treatment. She does not have any further questions at this time.

## 2020-08-17 ENCOUNTER — Ambulatory Visit (HOSPITAL_COMMUNITY): Payer: Self-pay

## 2020-08-18 ENCOUNTER — Ambulatory Visit (HOSPITAL_COMMUNITY)
Admission: RE | Admit: 2020-08-18 | Discharge: 2020-08-18 | Disposition: A | Discharge status: Home / Self Care | Payer: Medicaid Other | Source: Ambulatory Visit | Attending: Family Medicine | Admitting: Family Medicine

## 2020-08-18 ENCOUNTER — Other Ambulatory Visit: Payer: Self-pay

## 2020-08-18 VITALS — BP 110/61 | HR 81 | Temp 97.7°F | Resp 15 | Ht 65.0 in | Wt 143.3 lb

## 2020-08-18 DIAGNOSIS — N898 Other specified noninflammatory disorders of vagina: Secondary | ICD-10-CM | POA: Diagnosis not present

## 2020-08-18 NOTE — Discharge Instructions (Addendum)
You can check your test results on my Chart You will be called if any of the tests are positive

## 2020-08-18 NOTE — ED Provider Notes (Signed)
[facility]    CSN: [id number] Arrival date & time: 08/18/20  0109      History   Chief Complaint Chief Complaint  Patient presents with  . Exposure to STD    follow up    HPI Beverly Marquez is a 29 y.o. female.   HPI   Patient was seen for a STD evaluation on 08/08/2020.  Her swab report was positive for gonorrhea and for Candida.  She got the shot of ceftriaxone.  She took one of the Candida pills.  She is here because she still has irritation of her external genitalia she thinks is Candida.  She knows to take the second pill if she is not improved after few days.  She would like to have a swab repeated at this time.  Past Medical History:  Diagnosis Date  . No pertinent past medical history   . Trichomonas     Patient Active Problem List   Diagnosis Date Noted  . Contraception 09/12/2016    Past Surgical History:  Procedure Laterality Date  . NO PAST SURGERIES      OB History    Gravida  5   Para  3   Term  3   Preterm  0   AB  2   Living  3     SAB  1   TAB  1   Ectopic  0   Multiple      Live Births  3            Home Medications    Prior to Admission medications   Not on File    Family History Family History  Problem Relation Age of Onset  . Hypertension Mother   . Healthy Father     Social History Social History   Tobacco Use  . Smoking status: Never Smoker  . Smokeless tobacco: Never Used  Vaping Use  . Vaping Use: Never used  Substance Use Topics  . Alcohol use: Yes  . Drug use: Yes    Types: Marijuana     Allergies   Patient has no known allergies.   Review of Systems Review of Systems  See HPI Physical Exam Triage Vital Signs ED Triage Vitals  Enc Vitals Group     BP 08/18/20 0918 110/61     Pulse Rate 08/18/20 0918 81     Resp 08/18/20 0918 15     Temp 08/18/20 0918 97.7 F (36.5 C)     Temp Source 08/18/20 0918 Oral     SpO2 08/18/20 0918 98 %     Weight 08/18/20 0915 143  lb 4.8 oz (65 kg)     Height 08/18/20 0915 5\' 5"  (1.651 m)     Head Circumference --      Peak Flow --      Pain Score 08/18/20 0914 0     Pain Loc --      Pain Edu? --      Excl. in GC? --    No data found.  Updated Vital Signs BP 110/61 (BP Location: Right Arm)   Pulse 81   Temp 97.7 F (36.5 C) (Oral)   Resp 15   Ht 5\' 5"  (1.651 m)   Wt 65 kg   LMP 07/31/2020 (Exact Date)   SpO2 98%   BMI 23.85 kg/m     Physical Exam Constitutional:      General: She is not in acute distress.    Appearance: She  is well-developed.     Comments: Exam by observation.  Mask is in place.  No acute distress  HENT:     Head: Normocephalic and atraumatic.  Eyes:     Conjunctiva/sclera: Conjunctivae normal.     Pupils: Pupils are equal, round, and reactive to light.  Cardiovascular:     Rate and Rhythm: Normal rate.  Pulmonary:     Effort: Pulmonary effort is normal. No respiratory distress.  Abdominal:     Palpations: Abdomen is soft.  Musculoskeletal:        General: Normal range of motion.     Cervical back: Normal range of motion.  Skin:    General: Skin is warm and dry.  Neurological:     Mental Status: She is alert.  Psychiatric:        Behavior: Behavior normal.      UC Treatments / Results  Labs (all labs ordered are listed, but only abnormal results are displayed) Labs Reviewed  CERVICOVAGINAL ANCILLARY ONLY    EKG   Radiology No results found.  Procedures Procedures (including critical care time)  Medications Ordered in UC Medications - No data to display  Initial Impression / Assessment and Plan / UC Course  I have reviewed the triage vital signs and the nursing notes.  Pertinent labs & imaging results that were available during my care of the patient were reviewed by me and considered in my medical decision making (see chart for details).      Final Clinical Impressions(s) / UC Diagnoses   Final diagnoses:  Vaginal discharge     Discharge  Instructions     You can check your test results on my Chart You will be called if any of the tests are positive   ED Prescriptions    None     PDMP not reviewed this encounter.   Eustace Moore, MD 08/18/20 [phone]

## 2020-08-18 NOTE — ED Triage Notes (Signed)
Pt reports recent treatment for STD and yeast infection. Pt returns today because she still has itching to external labia . Pt wants to be rechecked.

## 2020-08-22 ENCOUNTER — Ambulatory Visit (HOSPITAL_COMMUNITY)
Admission: RE | Admit: 2020-08-22 | Discharge: 2020-08-22 | Disposition: A | Discharge status: Home / Self Care | Payer: Medicaid Other | Source: Ambulatory Visit | Attending: Family Medicine | Admitting: Family Medicine

## 2020-08-22 ENCOUNTER — Other Ambulatory Visit: Payer: Self-pay

## 2020-08-22 ENCOUNTER — Encounter (HOSPITAL_COMMUNITY): Payer: Self-pay

## 2020-08-22 VITALS — BP 110/57 | HR 77 | Temp 98.4°F | Resp 15 | Ht 65.0 in | Wt 143.3 lb

## 2020-08-22 DIAGNOSIS — Z202 Contact with and (suspected) exposure to infections with a predominantly sexual mode of transmission: Secondary | ICD-10-CM

## 2020-08-22 DIAGNOSIS — B9689 Other specified bacterial agents as the cause of diseases classified elsewhere: Secondary | ICD-10-CM

## 2020-08-22 DIAGNOSIS — N760 Acute vaginitis: Secondary | ICD-10-CM

## 2020-08-22 LAB — CERVICOVAGINAL ANCILLARY ONLY
Bacterial Vaginitis (gardnerella): POSITIVE — AB
Candida Glabrata: NEGATIVE
Candida Vaginitis: NEGATIVE
Chlamydia: POSITIVE — AB
Comment: NEGATIVE
Comment: NEGATIVE
Comment: NEGATIVE
Comment: NEGATIVE
Comment: NEGATIVE
Comment: NORMAL
Neisseria Gonorrhea: POSITIVE — AB
Trichomonas: NEGATIVE

## 2020-08-22 MED ORDER — AZITHROMYCIN 250 MG PO TABS
1000.0000 mg | ORAL_TABLET | Freq: Once | ORAL | Status: AC
  Administered 2020-08-22: 1000 mg via ORAL

## 2020-08-22 MED ORDER — CEFTRIAXONE SODIUM 500 MG IJ SOLR
500.0000 mg | Freq: Once | INTRAMUSCULAR | Status: AC
  Administered 2020-08-22: 500 mg via INTRAMUSCULAR

## 2020-08-22 MED ORDER — AZITHROMYCIN 250 MG PO TABS
ORAL_TABLET | ORAL | Status: AC
  Filled 2020-08-22: qty 4

## 2020-08-22 MED ORDER — CEFTRIAXONE SODIUM 500 MG IJ SOLR
INTRAMUSCULAR | Status: AC
  Filled 2020-08-22: qty 500

## 2020-08-22 MED ORDER — METRONIDAZOLE 500 MG PO TABS: 500.0000 mg | ORAL_TABLET | Freq: Two times a day (BID) | ORAL | 0 refills | Status: DC

## 2020-08-22 NOTE — ED Triage Notes (Signed)
Pt reports results on my chart today were positive for STD and Pt is here for anti-BX treatment.

## 2020-08-22 NOTE — ED Provider Notes (Signed)
Patient is here for treatment of positive cervical vaginal ancillary swab Appropriate antibiotics are ordered Instructions were given to avoid sexual encounters for 7 days    Eustace Moore, MD 08/22/20 [phone]

## 2020-08-22 NOTE — Discharge Instructions (Addendum)
Avoid sexual encounters until you have completed the 7 days of metronidazole treatment.

## 2020-08-29 ENCOUNTER — Ambulatory Visit (HOSPITAL_COMMUNITY): Payer: Self-pay

## 2020-09-02 ENCOUNTER — Other Ambulatory Visit: Payer: Self-pay

## 2020-09-02 ENCOUNTER — Ambulatory Visit
Admission: RE | Admit: 2020-09-02 | Discharge: 2020-09-02 | Disposition: A | Discharge status: Home / Self Care | Payer: Medicaid Other | Source: Ambulatory Visit | Attending: Physician Assistant | Admitting: Physician Assistant

## 2020-09-02 VITALS — BP 104/65 | HR 77 | Temp 98.3°F | Resp 20

## 2020-09-02 DIAGNOSIS — Z113 Encounter for screening for infections with a predominantly sexual mode of transmission: Secondary | ICD-10-CM | POA: Diagnosis not present

## 2020-09-02 NOTE — ED Triage Notes (Signed)
Pt requesting std testing

## 2020-09-02 NOTE — ED Provider Notes (Signed)
[facility]    CSN: [id number] Arrival date & time: 09/02/20  1450      History   Chief Complaint Chief Complaint  Patient presents with   SEXUALLY TRANSMITTED DISEASE    HPI Beverly Marquez is a 29 y.o. female.   29 year old female comes in for STD testing. She is asymptomatic. Denies any exposures. Denies vaginal discharge, itching, spotting. Denies urinary symptoms such as frequency, dysuria, hematuria. Denies fever, chills, body aches. Denies abdominal pain, nausea, vomiting. LMP 08/26/2020. Sexually active with one female partner. Was treated for [facility] 08/22/2020. States has had unprotected intercourse with same partner 2 days ago.      Past Medical History:  Diagnosis Date   No pertinent past medical history    Trichomonas     Patient Active Problem List   Diagnosis Date Noted   Contraception 09/12/2016    Past Surgical History:  Procedure Laterality Date   NO PAST SURGERIES      OB History    Gravida  5   Para  3   Term  3   Preterm  0   AB  2   Living  3     SAB  1   TAB  1   Ectopic  0   Multiple      Live Births  3            Home Medications    Prior to Admission medications   Not on File    Family History Family History  Problem Relation Age of Onset   Hypertension Mother    Healthy Father     Social History Social History   Tobacco Use   Smoking status: Never Smoker   Smokeless tobacco: Never Used  Building services engineer Use: Never used  Substance Use Topics   Alcohol use: Yes   Drug use: Yes    Types: Marijuana     Allergies   Patient has no known allergies.   Review of Systems Review of Systems  Reason unable to perform ROS: See HPI as above.     Physical Exam Triage Vital Signs ED Triage Vitals [09/02/20 1459]  Enc Vitals Group     BP 104/65     Pulse Rate 77     Resp 20     Temp 98.3 F (36.8 C)     Temp Source Oral     SpO2 95 %     Weight      Height       Head Circumference      Peak Flow      Pain Score      Pain Loc      Pain Edu?      Excl. in GC?    No data found.  Updated Vital Signs BP 104/65 (BP Location: Left Arm)    Pulse 77    Temp 98.3 F (36.8 C) (Oral)    Resp 20    LMP 08/26/2020    SpO2 95%   Physical Exam Constitutional:      General: She is not in acute distress.    Appearance: Normal appearance. She is well-developed. She is not toxic-appearing or diaphoretic.  HENT:     Head: Normocephalic and atraumatic.  Eyes:     Conjunctiva/sclera: Conjunctivae normal.     Pupils: Pupils are equal, round, and reactive to light.  Pulmonary:     Effort: Pulmonary effort is normal. No respiratory  distress.  Musculoskeletal:     Cervical back: Normal range of motion and neck supple.  Skin:    General: Skin is warm and dry.  Neurological:     Mental Status: She is alert and oriented to person, place, and time.      UC Treatments / Results  Labs (all labs ordered are listed, but only abnormal results are displayed) Labs Reviewed  CERVICOVAGINAL ANCILLARY ONLY    EKG   Radiology No results found.  Procedures Procedures (including critical care time)  Medications Ordered in UC Medications - No data to display  Initial Impression / Assessment and Plan / UC Course  I have reviewed the triage vital signs and the nursing notes.  Pertinent labs & imaging results that were available during my care of the patient were reviewed by me and considered in my medical decision making (see chart for details).    Discussed cytology testing will not be accurate for encounter 2 days ago. Patient expresses understanding and would like to proceed with testing. Cytology sent. Return precautions given.  Final Clinical Impressions(s) / UC Diagnoses   Final diagnoses:  Screen for STD (sexually transmitted disease)   ED Prescriptions    None     PDMP not reviewed this encounter.   Belinda Fisher, PA-C 09/02/20 1525

## 2020-09-02 NOTE — Discharge Instructions (Signed)
As discussed, this test will not be accurate for your last encounter. Please continue to monitor for symptoms. Cytology sent, you will be contacted with any positive results that requires further treatment. Monitor for any worsening of symptoms, fever, abdominal pain, nausea, vomiting, to follow up for reevaluation.

## 2020-09-05 LAB — CERVICOVAGINAL ANCILLARY ONLY
Chlamydia: NEGATIVE
Comment: NEGATIVE
Comment: NEGATIVE
Comment: NORMAL
Neisseria Gonorrhea: NEGATIVE
Trichomonas: NEGATIVE

## 2020-09-06 ENCOUNTER — Other Ambulatory Visit (HOSPITAL_COMMUNITY)
Admission: RE | Admit: 2020-09-06 | Discharge: 2020-09-06 | Disposition: A | Discharge status: Home / Self Care | Payer: Medicaid Other | Source: Ambulatory Visit | Attending: Obstetrics | Admitting: Obstetrics

## 2020-09-06 ENCOUNTER — Other Ambulatory Visit: Payer: Self-pay

## 2020-09-06 ENCOUNTER — Ambulatory Visit (INDEPENDENT_AMBULATORY_CARE_PROVIDER_SITE_OTHER): Payer: Medicaid Other | Admitting: Obstetrics

## 2020-09-06 ENCOUNTER — Encounter: Payer: Self-pay | Admitting: Obstetrics

## 2020-09-06 VITALS — BP 106/61 | HR 71 | Ht 65.0 in | Wt 150.0 lb

## 2020-09-06 DIAGNOSIS — Z01419 Encounter for gynecological examination (general) (routine) without abnormal findings: Secondary | ICD-10-CM | POA: Insufficient documentation

## 2020-09-06 DIAGNOSIS — N898 Other specified noninflammatory disorders of vagina: Secondary | ICD-10-CM | POA: Insufficient documentation

## 2020-09-06 DIAGNOSIS — B373 Candidiasis of vulva and vagina: Secondary | ICD-10-CM

## 2020-09-06 DIAGNOSIS — B9689 Other specified bacterial agents as the cause of diseases classified elsewhere: Secondary | ICD-10-CM | POA: Diagnosis not present

## 2020-09-06 DIAGNOSIS — N760 Acute vaginitis: Secondary | ICD-10-CM

## 2020-09-06 DIAGNOSIS — Z113 Encounter for screening for infections with a predominantly sexual mode of transmission: Secondary | ICD-10-CM

## 2020-09-06 DIAGNOSIS — B3731 Acute candidiasis of vulva and vagina: Secondary | ICD-10-CM

## 2020-09-06 DIAGNOSIS — E569 Vitamin deficiency, unspecified: Secondary | ICD-10-CM

## 2020-09-06 MED ORDER — METRONIDAZOLE 0.75 % VA GEL: VAGINAL | 5 refills | Status: DC

## 2020-09-06 MED ORDER — PRENATAL PLUS 27-1 MG PO TABS: 1.0000 | ORAL_TABLET | Freq: Every day | ORAL | 11 refills | Status: DC

## 2020-09-06 MED ORDER — TERCONAZOLE 0.8 % VA CREA: 1.0000 | TOPICAL_CREAM | Freq: Every day | VAGINAL | 0 refills | Status: DC

## 2020-09-06 NOTE — Progress Notes (Signed)
Pt c/o vaginal discharge and vaginal itching

## 2020-09-06 NOTE — Progress Notes (Signed)
Subjective:        Beverly Marquez is a 29 y.o. female here for a routine exam.  Current complaints: Malodorous vaginal discharge with itching.  Has a history of recurrent BV.  Personal health questionnaire:  Is patient Ashkenazi Jewish, have a family history of breast and/or ovarian cancer: no Is there a family history of uterine cancer diagnosed at age < 10, gastrointestinal cancer, urinary tract cancer, family member who is a Personnel officer syndrome-associated carrier: no Is the patient overweight and hypertensive, family history of diabetes, personal history of gestational diabetes, preeclampsia or PCOS: no Is patient over 48, have PCOS,  family history of premature CHD under age 56, diabetes, smoke, have hypertension or peripheral artery disease:  no At any time, has a partner hit, kicked or otherwise hurt or frightened you?: no Over the past 2 weeks, have you felt down, depressed or hopeless?: no Over the past 2 weeks, have you felt little interest or pleasure in doing things?:no   Gynecologic History Patient's last menstrual period was 08/26/2020. Contraception: none Last Pap: 01-07-2018. Results were: normal Last mammogram: n/a. Results were: n/a  Obstetric History OB History  Gravida Para Term Preterm AB Living  5 3 3  0 2 3  SAB TAB Ectopic Multiple Live Births  1 1 0   3    # Outcome Date GA Lbr Len/2nd Weight Sex Delivery Anes PTL Lv  5 Term 2017     Vag-Spont   LIV  4 Term 11/15/11 [redacted]w[redacted]d 10:45 / 00:11 6 lb 14 oz (3.118 kg) F Vag-Spont EPI  LIV  3 Term 06/30/10    F Vag-Spont EPI N LIV  2 TAB           1 SAB             Past Medical History:  Diagnosis Date  . No pertinent past medical history   . Trichomonas     Past Surgical History:  Procedure Laterality Date  . NO PAST SURGERIES      No current outpatient medications on file. No Known Allergies  Social History   Tobacco Use  . Smoking status: Never Smoker  . Smokeless tobacco: Never Used  Substance  Use Topics  . Alcohol use: Yes    Family History  Problem Relation Age of Onset  . Hypertension Mother   . Healthy Father       Review of Systems  Constitutional: negative for fatigue and weight loss Respiratory: negative for cough and wheezing Cardiovascular: negative for chest pain, fatigue and palpitations Gastrointestinal: negative for abdominal pain and change in bowel habits Musculoskeletal:negative for myalgias Neurological: negative for gait problems and tremors Behavioral/Psych: negative for abusive relationship, depression Endocrine: negative for temperature intolerance    Genitourinary:negative for abnormal menstrual periods, genital lesions, hot flashes, sexual problems.  Positive for malodorous vaginal discharge and itching Integument/breast: negative for breast lump, breast tenderness, nipple discharge and skin lesion(s)    Objective:       BP 106/61   Pulse 71   Ht 5\' 5"  (1.651 m)   Wt 150 lb (68 kg)   LMP 08/26/2020   BMI 24.96 kg/m  General:   alert and no distress  Skin:   no rash or abnormalities  Lungs:   clear to auscultation bilaterally  Heart:   regular rate and rhythm, S1, S2 normal, no murmur, click, rub or gallop  Breasts:   normal without suspicious masses, skin or nipple changes or axillary nodes  Abdomen:  normal findings: no organomegaly, soft, non-tender and no hernia  Pelvis:  External genitalia: normal general appearance Urinary system: urethral meatus normal and bladder without fullness, nontender Vaginal: normal without tenderness, induration or masses Cervix: normal appearance Adnexa: normal bimanual exam Uterus: anteverted and non-tender, normal size   Lab Review Urine pregnancy test Labs reviewed yes Radiologic studies reviewed no  50% of 20 min visit spent on counseling and coordination of care.   Assessment:     1. Encounter for routine gynecological examination with Papanicolaou smear of cervix Rx: - Cytology - PAP(  [place])  2. Vaginal discharge Rx: - Cervicovaginal ancillary only( [place])  3. BV (bacterial vaginosis) Rx: - metroNIDAZOLE (METROGEL VAGINAL) 0.75 % vaginal gel; Use as directed bid after the period, monthly for 6 months.  Dispense: 70 g; Refill: 5  4. Candida vaginitis Rx: - terconazole (TERAZOL 3) 0.8 % vaginal cream; Place 1 applicator vaginally at bedtime.  Dispense: 20 g; Refill: 0  5. Vitamin deficiency Rx: - prenatal vitamin w/FE, FA (PRENATAL 1 + 1) 27-1 MG TABS tablet; Take 1 tablet by mouth daily before breakfast.  Dispense: 30 tablet; Refill: 11    Plan:    Education reviewed: calcium supplements, depression evaluation, low fat, low cholesterol diet, safe sex/STD prevention, self breast exams and weight bearing exercise. Contraception: none. Follow up in: 1 year.    Brock Bad, MD 09/06/2020 11:08 AM

## 2020-09-12 LAB — CERVICOVAGINAL ANCILLARY ONLY
Bacterial Vaginitis (gardnerella): NEGATIVE
Candida Glabrata: NEGATIVE
Candida Vaginitis: NEGATIVE
Chlamydia: NEGATIVE
Comment: NEGATIVE
Comment: NEGATIVE
Comment: NEGATIVE
Comment: NEGATIVE
Comment: NEGATIVE
Comment: NORMAL
Neisseria Gonorrhea: NEGATIVE
Trichomonas: NEGATIVE

## 2020-09-14 LAB — CYTOLOGY - PAP

## 2020-09-15 ENCOUNTER — Other Ambulatory Visit: Payer: Self-pay | Admitting: Obstetrics

## 2020-09-27 ENCOUNTER — Other Ambulatory Visit: Payer: Self-pay

## 2020-09-27 ENCOUNTER — Ambulatory Visit
Admission: RE | Admit: 2020-09-27 | Discharge: 2020-09-27 | Disposition: A | Discharge status: Home / Self Care | Payer: Medicaid Other | Source: Ambulatory Visit | Attending: Emergency Medicine | Admitting: Emergency Medicine

## 2020-09-27 VITALS — BP 107/67 | HR 84 | Temp 98.4°F | Resp 18

## 2020-09-27 DIAGNOSIS — Z3A01 Less than 8 weeks gestation of pregnancy: Secondary | ICD-10-CM | POA: Diagnosis not present

## 2020-09-27 DIAGNOSIS — Z3201 Encounter for pregnancy test, result positive: Secondary | ICD-10-CM

## 2020-09-27 LAB — POCT URINE PREGNANCY: Preg Test, Ur: POSITIVE — AB

## 2020-09-27 NOTE — ED Provider Notes (Signed)
[facility]    CSN: [id number] Arrival date & time: 09/27/20  1340      History   Chief Complaint Chief Complaint  Patient presents with  . pregnancy test    HPI Beverly Marquez is a 29 y.o. female  Presenting for pregnancy test.  States that her doctor is requiring this: Had a home positive pregnancy test.  LMP 11/11: Normal for her.  No vaginal pain, bleeding, discharge.  Taking prenatal vitamin.  Past Medical History:  Diagnosis Date  . No pertinent past medical history   . Trichomonas     Patient Active Problem List   Diagnosis Date Noted  . Contraception 09/12/2016    Past Surgical History:  Procedure Laterality Date  . NO PAST SURGERIES      OB History    Gravida  6   Para  3   Term  3   Preterm  0   AB  2   Living  3     SAB  1   IAB  1   Ectopic  0   Multiple      Live Births  3            Home Medications    Prior to Admission medications   Medication Sig Start Date End Date Taking? Authorizing Provider  prenatal vitamin w/FE, FA (PRENATAL 1 + 1) 27-1 MG TABS tablet Take 1 tablet by mouth daily before breakfast. 09/06/20   Brock Bad, MD    Family History Family History  Problem Relation Age of Onset  . Hypertension Mother   . Healthy Father     Social History Social History   Tobacco Use  . Smoking status: Never Smoker  . Smokeless tobacco: Never Used  Vaping Use  . Vaping Use: Never used  Substance Use Topics  . Alcohol use: Yes  . Drug use: Yes    Types: Marijuana     Allergies   Patient has no known allergies.   Review of Systems Review of Systems  Constitutional: Negative for fatigue and fever.  Respiratory: Negative for cough and shortness of breath.   Cardiovascular: Negative for chest pain and palpitations.  Gastrointestinal: Negative for constipation and diarrhea.  Genitourinary: Negative for dysuria, flank pain, frequency, hematuria, pelvic pain, urgency, vaginal  bleeding, vaginal discharge and vaginal pain.     Physical Exam Triage Vital Signs ED Triage Vitals  Enc Vitals Group     BP 09/27/20 1403 107/67     Pulse Rate 09/27/20 1403 84     Resp 09/27/20 1403 18     Temp 09/27/20 1403 98.4 F (36.9 C)     Temp Source 09/27/20 1403 Oral     SpO2 09/27/20 1404 96 %     Weight --      Height --      Head Circumference --      Peak Flow --      Pain Score 09/27/20 1404 0     Pain Loc --      Pain Edu? --      Excl. in GC? --    No data found.  Updated Vital Signs BP 107/67 (BP Location: Left Arm)   Pulse 84   Temp 98.4 F (36.9 C) (Oral)   Resp 18   LMP 08/25/2020   SpO2 96%   Visual Acuity Right Eye Distance:   Left Eye Distance:   Bilateral Distance:    Right Eye Near:  Left Eye Near:    Bilateral Near:     Physical Exam Constitutional:      General: She is not in acute distress. HENT:     Head: Normocephalic and atraumatic.  Eyes:     General: No scleral icterus.    Pupils: Pupils are equal, round, and reactive to light.  Cardiovascular:     Rate and Rhythm: Normal rate.  Pulmonary:     Effort: Pulmonary effort is normal.  Abdominal:     General: Bowel sounds are normal.     Palpations: Abdomen is soft.     Tenderness: There is no abdominal tenderness. There is no right CVA tenderness, left CVA tenderness or guarding.  Skin:    Coloration: Skin is not jaundiced or pale.  Neurological:     Mental Status: She is alert and oriented to person, place, and time.      UC Treatments / Results  Labs (all labs ordered are listed, but only abnormal results are displayed) Labs Reviewed  POCT URINE PREGNANCY - Abnormal; Notable for the following components:      Result Value   Preg Test, Ur Positive (*)    All other components within normal limits    EKG   Radiology No results found.  Procedures Procedures (including critical care time)  Medications Ordered in UC Medications - No data to  display  Initial Impression / Assessment and Plan / UC Course  I have reviewed the triage vital signs and the nursing notes.  Pertinent labs & imaging results that were available during my care of the patient were reviewed by me and considered in my medical decision making (see chart for details).     Urine pregnancy positive.  Patient asymptomatic.  We will continue prenatal vitamin.  Provided handout for OTC meds safe in pregnancy.  Return precautions discussed, pt verbalized understanding and is agreeable to plan. Final Clinical Impressions(s) / UC Diagnoses   Final diagnoses:  Encounter for pregnancy test, result positive  Less than [redacted] weeks gestation of pregnancy   Discharge Instructions   None    ED Prescriptions    None     PDMP not reviewed this encounter.   Hall[place]Potvin, [place], [place] 09/27/20 1407

## 2020-09-27 NOTE — ED Triage Notes (Signed)
Pt states had a positive pregnancy test and needs documentation to get her dr. Tyrell Antonio.

## 2020-10-03 ENCOUNTER — Ambulatory Visit
Admission: RE | Admit: 2020-10-03 | Discharge: 2020-10-03 | Disposition: A | Discharge status: Home / Self Care | Payer: Medicaid Other | Source: Ambulatory Visit | Attending: Emergency Medicine | Admitting: Emergency Medicine

## 2020-10-03 VITALS — BP 109/68 | HR 85 | Temp 98.2°F | Resp 18

## 2020-10-03 DIAGNOSIS — N898 Other specified noninflammatory disorders of vagina: Secondary | ICD-10-CM | POA: Diagnosis not present

## 2020-10-03 MED ORDER — METRONIDAZOLE 500 MG PO TABS
500.0000 mg | ORAL_TABLET | Freq: Two times a day (BID) | ORAL | 0 refills | Status: AC
Start: 2020-10-03 — End: 2020-10-10

## 2020-10-03 NOTE — ED Triage Notes (Signed)
Pt c/o acute vaginal discharge with "fishy smell" onset this morning. Also c/o nausea, diarrhea for several days.  Pt states she is [redacted] weeks pregnant, had lower abdominal pain last week which has since resolved. Denies fever, vomiting, flank or lower back pain, dysuria symptoms, or vaginal bleeding.

## 2020-10-03 NOTE — ED Provider Notes (Signed)
[facility]    CSN: [id number] Arrival date & time: 10/03/20  1150      History   Chief Complaint Chief Complaint  Patient presents with  . Vaginal Discharge    HPI Beverly Marquez is a 29 y.o. female approximately [redacted] weeks pregnant.  Presenting today for evaluation of vaginal discharge.  Symptoms began this morning.  Has associated odor nausea and diarrhea.  Reports feels similar to bacterial vaginosis in the past and feels confident that this is the cause.  Would like to screen for STDs as well on swab.  Declining HIV and syphilis screening.  Patient reports she does not plan on keeping pregnancy.  HPI  Past Medical History:  Diagnosis Date  . No pertinent past medical history   . Trichomonas     Patient Active Problem List   Diagnosis Date Noted  . Contraception 09/12/2016    Past Surgical History:  Procedure Laterality Date  . NO PAST SURGERIES      OB History    Gravida  6   Para  3   Term  3   Preterm  0   AB  2   Living  3     SAB  1   IAB  1   Ectopic  0   Multiple      Live Births  3            Home Medications    Prior to Admission medications   Medication Sig Start Date End Date Taking? Authorizing Provider  prenatal vitamin w/FE, FA (PRENATAL 1 + 1) 27-1 MG TABS tablet Take 1 tablet by mouth daily before breakfast. 09/06/20  Yes Brock Bad, MD  metroNIDAZOLE (FLAGYL) 500 MG tablet Take 1 tablet (500 mg total) by mouth 2 (two) times daily for 7 days. 10/03/20 10/10/20  Kenshin Splawn, Junius Creamer, PA-C    Family History Family History  Problem Relation Age of Onset  . Hypertension Mother   . Healthy Father     Social History Social History   Tobacco Use  . Smoking status: Never Smoker  . Smokeless tobacco: Never Used  Vaping Use  . Vaping Use: Never used  Substance Use Topics  . Alcohol use: Yes  . Drug use: Yes    Types: Marijuana     Allergies   Patient has no known allergies.   Review of  Systems Review of Systems  Constitutional: Negative for fever.  Respiratory: Negative for shortness of breath.   Cardiovascular: Negative for chest pain.  Gastrointestinal: Negative for abdominal pain, diarrhea, nausea and vomiting.  Genitourinary: Positive for vaginal discharge. Negative for dysuria, flank pain, genital sores, hematuria, menstrual problem, vaginal bleeding and vaginal pain.  Musculoskeletal: Negative for back pain.  Skin: Negative for rash.  Neurological: Negative for dizziness, light-headedness and headaches.     Physical Exam Triage Vital Signs ED Triage Vitals  Enc Vitals Group     BP 10/03/20 1222 109/68     Pulse Rate 10/03/20 1222 85     Resp 10/03/20 1222 18     Temp 10/03/20 1222 98.2 F (36.8 C)     Temp Source 10/03/20 1222 Oral     SpO2 10/03/20 1222 98 %     Weight --      Height --      Head Circumference --      Peak Flow --      Pain Score 10/03/20 1220 0     Pain  Loc --      Pain Edu? --      Excl. in GC? --    No data found.  Updated Vital Signs BP 109/68 (BP Location: Left Arm)   Pulse 85   Temp 98.2 F (36.8 C) (Oral)   Resp 18   LMP 08/26/2020   SpO2 98%   Visual Acuity Right Eye Distance:   Left Eye Distance:   Bilateral Distance:    Right Eye Near:   Left Eye Near:    Bilateral Near:     Physical Exam Vitals and nursing note reviewed.  Constitutional:      Appearance: She is well-developed and well-nourished.     Comments: No acute distress  HENT:     Head: Normocephalic and atraumatic.     Nose: Nose normal.  Eyes:     Conjunctiva/sclera: Conjunctivae normal.  Cardiovascular:     Rate and Rhythm: Normal rate.  Pulmonary:     Effort: Pulmonary effort is normal. No respiratory distress.  Abdominal:     General: There is no distension.  Musculoskeletal:        General: Normal range of motion.     Cervical back: Neck supple.  Skin:    General: Skin is warm and dry.  Neurological:     Mental Status: She is  alert and oriented to person, place, and time.  Psychiatric:        Mood and Affect: Mood and affect normal.      UC Treatments / Results  Labs (all labs ordered are listed, but only abnormal results are displayed) Labs Reviewed  CERVICOVAGINAL ANCILLARY ONLY    EKG   Radiology No results found.  Procedures Procedures (including critical care time)  Medications Ordered in UC Medications - No data to display  Initial Impression / Assessment and Plan / UC Course  I have reviewed the triage vital signs and the nursing notes.  Pertinent labs & imaging results that were available during my care of the patient were reviewed by me and considered in my medical decision making (see chart for details).     Vaginal swab pending.  Empirically treating for BV today with metronidazole x1 week.  Will alter therapy based off results.  Discussed strict return precautions. Patient verbalized understanding and is agreeable with plan.  Final Clinical Impressions(s) / UC Diagnoses   Final diagnoses:  Vaginal discharge     Discharge Instructions     Take flagyl as prescribed for BV  We are testing you for Gonorrhea, Chlamydia, Trichomonas, Yeast and Bacterial Vaginosis. We will call you if anything is positive and let you know if you require any further treatment. Please inform partners of any positive results.   Please return if symptoms not improving with treatment, development of fever, nausea, vomiting, abdominal pain.     ED Prescriptions    Medication Sig Dispense Auth. Provider   metroNIDAZOLE (FLAGYL) 500 MG tablet Take 1 tablet (500 mg total) by mouth 2 (two) times daily for 7 days. 14 tablet Zaida Reiland, [place] C, PA-C     PDMP not reviewed this encounter.   Lew Dawes, [place] 10/03/20 1247

## 2020-10-03 NOTE — Discharge Instructions (Signed)
Take flagyl as prescribed for BV  We are testing you for Gonorrhea, Chlamydia, Trichomonas, Yeast and Bacterial Vaginosis. We will call you if anything is positive and let you know if you require any further treatment. Please inform partners of any positive results.   Please return if symptoms not improving with treatment, development of fever, nausea, vomiting, abdominal pain.

## 2020-10-05 LAB — CERVICOVAGINAL ANCILLARY ONLY
Bacterial Vaginitis (gardnerella): POSITIVE — AB
Candida Glabrata: NEGATIVE
Candida Vaginitis: NEGATIVE
Chlamydia: NEGATIVE
Comment: NEGATIVE
Comment: NEGATIVE
Comment: NEGATIVE
Comment: NEGATIVE
Comment: NEGATIVE
Comment: NORMAL
Neisseria Gonorrhea: NEGATIVE
Trichomonas: NEGATIVE

## 2020-10-22 ENCOUNTER — Ambulatory Visit (HOSPITAL_COMMUNITY): Payer: Self-pay

## 2020-10-24 ENCOUNTER — Ambulatory Visit (HOSPITAL_COMMUNITY)
Admission: RE | Admit: 2020-10-24 | Discharge: 2020-10-24 | Disposition: A | Discharge status: Home / Self Care | Payer: Medicaid Other | Source: Ambulatory Visit | Attending: Family Medicine | Admitting: Family Medicine

## 2020-10-24 ENCOUNTER — Other Ambulatory Visit: Payer: Self-pay

## 2020-10-24 ENCOUNTER — Encounter (HOSPITAL_COMMUNITY): Payer: Self-pay

## 2020-10-24 VITALS — BP 115/55 | HR 88 | Temp 98.0°F | Resp 17

## 2020-10-24 DIAGNOSIS — Z202 Contact with and (suspected) exposure to infections with a predominantly sexual mode of transmission: Secondary | ICD-10-CM | POA: Insufficient documentation

## 2020-10-24 LAB — HIV ANTIBODY (ROUTINE TESTING W REFLEX): HIV Screen 4th Generation wRfx: NONREACTIVE

## 2020-10-24 NOTE — ED Provider Notes (Signed)
[facility]    CSN: [id number] Arrival date & time: 10/24/20  1046      History   Chief Complaint Chief Complaint  Patient presents with  . STD testing    HPI Beverly Marquez is a 30 y.o. female.   HPI  Patient has no symptoms.  She is here desiring STD testing "to be safe". She was in her early pregnancy.  Is under the care of OB is taking prenatal vitamins States that she is otherwise healthy  Past Medical History:  Diagnosis Date  . No pertinent past medical history   . Trichomonas     Patient Active Problem List   Diagnosis Date Noted  . Contraception 09/12/2016    Past Surgical History:  Procedure Laterality Date  . NO PAST SURGERIES      OB History    Gravida  6   Para  3   Term  3   Preterm  0   AB  2   Living  3     SAB  1   IAB  1   Ectopic  0   Multiple      Live Births  3            Home Medications    Prior to Admission medications   Medication Sig Start Date End Date Taking? Authorizing Provider  prenatal vitamin w/FE, FA (PRENATAL 1 + 1) 27-1 MG TABS tablet Take 1 tablet by mouth daily before breakfast. 09/06/20   Brock Bad, MD    Family History Family History  Problem Relation Age of Onset  . Hypertension Mother   . Healthy Father     Social History Social History   Tobacco Use  . Smoking status: Never Smoker  . Smokeless tobacco: Never Used  Vaping Use  . Vaping Use: Never used  Substance Use Topics  . Alcohol use: Yes  . Drug use: Yes    Types: Marijuana     Allergies   Patient has no known allergies.   Review of Systems Review of Systems See HPI  Physical Exam Triage Vital Signs ED Triage Vitals  Enc Vitals Group     BP 10/24/20 1117 (!) 115/55     Pulse Rate 10/24/20 1117 88     Resp 10/24/20 1117 17     Temp 10/24/20 1117 98 F (36.7 C)     Temp src --      SpO2 10/24/20 1117 99 %     Weight --      Height --      Head Circumference --      Peak Flow --       Pain Score 10/24/20 1115 0     Pain Loc --      Pain Edu? --      Excl. in GC? --    No data found.  Updated Vital Signs BP (!) 115/55   Pulse 88   Temp 98 F (36.7 C)   Resp 17   LMP 08/26/2020   SpO2 99%     Physical Exam Constitutional:      General: She is not in acute distress.    Appearance: She is well-developed and well-nourished.     Comments: Exam by observation  HENT:     Head: Normocephalic and atraumatic.     Mouth/Throat:     Mouth: Oropharynx is clear and moist.  Eyes:     Conjunctiva/sclera: Conjunctivae normal.  Pupils: Pupils are equal, round, and reactive to light.  Cardiovascular:     Rate and Rhythm: Normal rate.  Pulmonary:     Effort: Pulmonary effort is normal. No respiratory distress.  Abdominal:     General: There is no distension.     Palpations: Abdomen is soft.  Musculoskeletal:        General: No edema. Normal range of motion.     Cervical back: Normal range of motion.  Skin:    General: Skin is warm and dry.  Neurological:     Mental Status: She is alert.      UC Treatments / Results  Labs (all labs ordered are listed, but only abnormal results are displayed) Labs Reviewed  RPR  HIV ANTIBODY (ROUTINE TESTING W REFLEX)  CERVICOVAGINAL ANCILLARY ONLY    EKG   Radiology No results found.  Procedures Procedures (including critical care time)  Medications Ordered in UC Medications - No data to display  Initial Impression / Assessment and Plan / UC Course  I have reviewed the triage vital signs and the nursing notes.  Pertinent labs & imaging results that were available during my care of the patient were reviewed by me and considered in my medical decision making (see chart for details).     Final Clinical Impressions(s) / UC Diagnoses   Final diagnoses:  Possible exposure to STD     Discharge Instructions     Check for test results on my Chart You will be called if any tests are positive   ED  Prescriptions    None     PDMP not reviewed this encounter.   Eustace Moore, MD 10/24/20 [phone]

## 2020-10-24 NOTE — ED Triage Notes (Signed)
Pt in requesting routine STD screening  Denies any sxs

## 2020-10-24 NOTE — Discharge Instructions (Signed)
Check for test results on my Chart You will be called if any tests are positive

## 2020-10-25 LAB — CERVICOVAGINAL ANCILLARY ONLY
Bacterial Vaginitis (gardnerella): POSITIVE — AB
Candida Glabrata: NEGATIVE
Candida Vaginitis: NEGATIVE
Chlamydia: NEGATIVE
Comment: NEGATIVE
Comment: NEGATIVE
Comment: NEGATIVE
Comment: NEGATIVE
Comment: NEGATIVE
Comment: NORMAL
Neisseria Gonorrhea: NEGATIVE
Trichomonas: NEGATIVE

## 2020-10-25 LAB — RPR: RPR Ser Ql: NONREACTIVE

## 2020-10-26 ENCOUNTER — Telehealth (HOSPITAL_COMMUNITY): Payer: Self-pay

## 2020-10-26 MED ORDER — METRONIDAZOLE 0.75 % VA GEL: 1.0000 | Freq: Every day | VAGINAL | 0 refills | Status: AC

## 2020-10-30 ENCOUNTER — Ambulatory Visit: Payer: Self-pay

## 2020-10-31 ENCOUNTER — Encounter (HOSPITAL_COMMUNITY): Payer: Self-pay

## 2020-10-31 ENCOUNTER — Other Ambulatory Visit: Payer: Self-pay

## 2020-10-31 ENCOUNTER — Encounter: Payer: Medicaid Other | Admitting: Obstetrics

## 2020-10-31 ENCOUNTER — Ambulatory Visit (HOSPITAL_COMMUNITY)
Admission: EM | Admit: 2020-10-31 | Discharge: 2020-10-31 | Disposition: A | Discharge status: Home / Self Care | Payer: Medicaid Other | Attending: Physician Assistant | Admitting: Physician Assistant

## 2020-10-31 DIAGNOSIS — L42 Pityriasis rosea: Secondary | ICD-10-CM | POA: Diagnosis not present

## 2020-10-31 MED ORDER — TRIAMCINOLONE ACETONIDE 0.1 % EX CREA: 1.0000 "application " | TOPICAL_CREAM | Freq: Two times a day (BID) | CUTANEOUS | 0 refills | Status: DC

## 2020-10-31 MED ORDER — HYDROCORTISONE 1 % EX CREA: TOPICAL_CREAM | CUTANEOUS | 0 refills | Status: DC

## 2020-10-31 NOTE — ED Triage Notes (Signed)
Pt in with c/o rash on chest, abdomen that she noticed 1 week ago  Pt has been using cortisone cream with no relief  States that it is itchy

## 2020-10-31 NOTE — ED Provider Notes (Signed)
[facility]    CSN: [id number] Arrival date & time: 10/31/20  1817      History   Chief Complaint Chief Complaint  Patient presents with  . Rash    HPI Beverly Marquez is a 30 y.o. female.   Pt complains of a rash to her back, abdomen, and chest that started about one week ago.  Reports it is very itchy.  She has applied cortisone cream with temporary relief.  She denies new soaps, detergents, or creams.       Past Medical History:  Diagnosis Date  . No pertinent past medical history   . Trichomonas     Patient Active Problem List   Diagnosis Date Noted  . Contraception 09/12/2016    Past Surgical History:  Procedure Laterality Date  . NO PAST SURGERIES      OB History    Gravida  6   Para  3   Term  3   Preterm  0   AB  2   Living  3     SAB  1   IAB  1   Ectopic  0   Multiple      Live Births  3            Home Medications    Prior to Admission medications   Medication Sig Start Date End Date Taking? Authorizing Provider  triamcinolone (KENALOG) 0.1 % Apply 1 application topically 2 (two) times daily. 10/31/20  Yes Lister Brizzi, PA-C  metroNIDAZOLE (METROGEL VAGINAL) 0.75 % vaginal gel Place 1 Applicatorful vaginally at bedtime for 5 days. 10/26/20 10/31/20  Merrilee Jansky, MD  prenatal vitamin w/FE, FA (PRENATAL 1 + 1) 27-1 MG TABS tablet Take 1 tablet by mouth daily before breakfast. 09/06/20   Brock Bad, MD    Family History Family History  Problem Relation Age of Onset  . Hypertension Mother   . Healthy Father     Social History Social History   Tobacco Use  . Smoking status: Never Smoker  . Smokeless tobacco: Never Used  Vaping Use  . Vaping Use: Never used  Substance Use Topics  . Alcohol use: Yes  . Drug use: Yes    Types: Marijuana     Allergies   Patient has no known allergies.   Review of Systems Review of Systems  Constitutional: Negative for chills and fever.  HENT:  Negative for ear pain and sore throat.   Eyes: Negative for pain and visual disturbance.  Respiratory: Negative for cough and shortness of breath.   Cardiovascular: Negative for chest pain and palpitations.  Gastrointestinal: Negative for abdominal pain and vomiting.  Genitourinary: Negative for dysuria and hematuria.  Musculoskeletal: Negative for arthralgias and back pain.  Skin: Positive for rash. Negative for color change.  Neurological: Negative for seizures and syncope.  All other systems reviewed and are negative.    Physical Exam Triage Vital Signs ED Triage Vitals  Enc Vitals Group     BP 10/31/20 1855 126/71     Pulse Rate 10/31/20 1855 97     Resp 10/31/20 1855 16     Temp 10/31/20 1855 98.9 F (37.2 C)     Temp src --      SpO2 10/31/20 1855 98 %     Weight --      Height --      Head Circumference --      Peak Flow --      Pain  Score 10/31/20 1853 0     Pain Loc --      Pain Edu? --      Excl. in GC? --    No data found.  Updated Vital Signs BP 126/71   Pulse 97   Temp 98.9 F (37.2 C)   Resp 16   LMP 08/26/2020   SpO2 98%   Breastfeeding Unknown   Visual Acuity Right Eye Distance:   Left Eye Distance:   Bilateral Distance:    Right Eye Near:   Left Eye Near:    Bilateral Near:     Physical Exam Vitals and nursing note reviewed.  Constitutional:      General: She is not in acute distress.    Appearance: She is well-developed and well-nourished.  HENT:     Head: Normocephalic and atraumatic.  Eyes:     Conjunctiva/sclera: Conjunctivae normal.  Cardiovascular:     Rate and Rhythm: Normal rate and regular rhythm.     Heart sounds: No murmur heard.   Pulmonary:     Effort: Pulmonary effort is normal. No respiratory distress.     Breath sounds: Normal breath sounds.  Abdominal:     Palpations: Abdomen is soft.     Tenderness: There is no abdominal tenderness.  Musculoskeletal:        General: No edema.     Cervical back: Neck supple.   Skin:    General: Skin is warm and dry.     Findings: Rash (circular dry patches noted to back abdomen and chest consistent with pityriasis rosea) present.  Neurological:     Mental Status: She is alert.  Psychiatric:        Mood and Affect: Mood and affect normal.      UC Treatments / Results  Labs (all labs ordered are listed, but only abnormal results are displayed) Labs Reviewed - No data to display  EKG   Radiology No results found.  Procedures Procedures (including critical care time)  Medications Ordered in UC Medications - No data to display  Initial Impression / Assessment and Plan / UC Course  I have reviewed the triage vital signs and the nursing notes.  Pertinent labs & imaging results that were available during my care of the patient were reviewed by me and considered in my medical decision making (see chart for details).     Rash consistent with pityriasis rosea to back, abdomen, and chest.  Recommend benadryl as needed for itching.  Triamcinolone cream sent to pharmacy.  Rash is self limited, discussed with pt.  Final Clinical Impressions(s) / UC Diagnoses   Final diagnoses:  Pityriasis rosea     Discharge Instructions     Can take Benadryl as needed for itching.   Use cream as needed.    ED Prescriptions    Medication Sig Dispense Auth. Provider   hydrocortisone cream 1 %  (Status: Discontinued) Apply to affected area 2 times daily 15 g Ronnie Mallette, PA-C   triamcinolone (KENALOG) 0.1 % Apply 1 application topically 2 (two) times daily. 30 g Jodell Cipro, PA-C     PDMP not reviewed this encounter.   Jodell Cipro, PA-C 10/31/20 1914

## 2020-10-31 NOTE — Discharge Instructions (Addendum)
Can take Benadryl as needed for itching.   Use cream as needed.

## 2020-11-09 DIAGNOSIS — Z3480 Encounter for supervision of other normal pregnancy, unspecified trimester: Secondary | ICD-10-CM | POA: Insufficient documentation

## 2020-11-10 ENCOUNTER — Encounter: Payer: Medicaid Other | Admitting: Obstetrics and Gynecology

## 2020-11-10 DIAGNOSIS — Z3480 Encounter for supervision of other normal pregnancy, unspecified trimester: Secondary | ICD-10-CM

## 2020-11-17 ENCOUNTER — Encounter: Payer: Self-pay | Admitting: Obstetrics

## 2020-11-17 ENCOUNTER — Ambulatory Visit (INDEPENDENT_AMBULATORY_CARE_PROVIDER_SITE_OTHER): Payer: Medicaid Other | Admitting: Obstetrics

## 2020-11-17 ENCOUNTER — Other Ambulatory Visit: Payer: Self-pay

## 2020-11-17 ENCOUNTER — Other Ambulatory Visit (HOSPITAL_COMMUNITY)
Admission: RE | Admit: 2020-11-17 | Discharge: 2020-11-17 | Disposition: A | Discharge status: Home / Self Care | Payer: Medicaid Other | Source: Ambulatory Visit | Attending: Obstetrics | Admitting: Obstetrics

## 2020-11-17 VITALS — BP 107/80 | HR 70 | Wt 152.0 lb

## 2020-11-17 DIAGNOSIS — N898 Other specified noninflammatory disorders of vagina: Secondary | ICD-10-CM | POA: Diagnosis not present

## 2020-11-17 DIAGNOSIS — R87612 Low grade squamous intraepithelial lesion on cytologic smear of cervix (LGSIL): Secondary | ICD-10-CM

## 2020-11-17 NOTE — Patient Instructions (Signed)
https://www.[facility].org/womens-[email or web address]/~/link.[email or web address]=z">  Colposcopy  Colposcopy is a procedure to examine the lowest part of the uterus (cervix), the vagina, and the area around the vaginal opening (vulva) for abnormalities or signs of disease. This procedure is done using an instrument that makes objects appear larger and provides light. (colposcope). During the procedure, the health care provider may remove a tissue sample to be looked at later under a microscope (biopsy). A biopsy may be done if any unusual cells are found during the colposcopy. You may have a colposcopy if you have:  An abnormal Pap smear, also called a Pap test. This screening test is used to check for signs of cancer or infection of the vagina, cervix, and uterus.  An HPV (human papillomavirus) test and get a positive result for a type of HPV that puts you at high risk of cancer.  Certain conditions or symptoms, such as: ? A sore, or lesion, on your cervix. ? Genital warts on your vulva, vagina, or cervix. ? Pain during sex. ? Vaginal bleeding, especially after sex.  A growth on your cervix (cervical polyp) that needs to be removed. Let your health care provider know about:  Any allergies you have, including allergies to medicines, latex, or iodine.  All medicines you are taking, including vitamins, herbs, eye drops, creams, and over-the-counter medicines.  Any problems you or family members have had with anesthetic medicines.  Any blood disorders you have.  Any surgeries you have had.  Any medical conditions you have, such as pelvic inflammatory disease (PID) or endometrial disorder.  The pattern of your menstrual cycles and the form of birth control (contraception) you use, if any.  Your medical history, including any history of fainting often or of cervical treatment.  Whether you are pregnant or may be pregnant. What are the risks? Generally, this is a safe  procedure. However, problems may occur, including:  Infection. Symptoms of infection may include fever, bad-smelling vaginal discharge, or pelvic pain.  Allergic reactions to medicines.  Damage to nearby structures or organs.  Fainting. This is rare. What happens before the procedure? Eating and drinking restrictions  Follow instructions from your health care provider about eating or drinking restrictions.  You will likely need to eat a regular diet the day of the procedure and not skip any meals. Tests  You may have an exam or testing. A pregnancy test will be done the day of the procedure.  You may have a blood or urine sample taken. General instructions  Ask your health care provider about: ? Changing or stopping your regular medicines. This is especially important if you are taking diabetes medicines or blood thinners. ? Taking medicines such as aspirin and ibuprofen. These medicines can thin your blood. Do not take these medicines unless your health care provider tells you to take them. Your health care provider will likely tell you to avoid taking aspirin, or medicine that contains aspirin, for 7 days before the procedure. ? Taking over-the-counter medicines, vitamins, herbs, and supplements.  Tell your health care provider if you have your menstrual period now or will have it at the time of your procedure. A colposcopy is not normally done during your menstrual period.  If you use contraception, continue to use it before your procedure.  For 24 hours before the procedure: ? Do not use douche products or tampons. ? Do not use medicines, creams, or suppositories in the vagina. ? Do not have sex.  Ask your health care provider what steps will be   taken to prevent infection. What happens during the procedure?  You will lie down on your back, with your feet in foot rests (stirrups).  A tool called a speculum will be warmed and will have oil or gel put on it (will be  lubricated). The speculum will then be inserted into your vagina. This will be used to hold apart the walls of your vagina so your health care provider can see your cervix and the inside of your vagina.  A cotton swab will be used to place a small amount of a liquid (solution) on the areas to be examined. This solution makes it easier to see abnormal cells. You may feel a slight burning during this part.  The colposcope will be used to scan the cervix with a bright white light. The colposcope will be held near your vulva and will make your vulva, vagina, and cervix look bigger so they can be seen better.  If a biopsy is needed: ? You may be given a medicine to numb the area (local anesthetic). ? Surgical tools will be used to remove mucus and cells through your vagina. ? You may feel mild pain while the tissue sample is removed. ? Bleeding may occur. A solution may be used to stop the bleeding. ? If a biopsy is needed from the inside of the cervix, a different procedure called endocervical curettage (ECC) may be done. During this procedure, a curved tool called a curette will be used to scrape cells from your cervix or the top of your cervix (endocervix).  Any abnormalities that are found will be recorded. The procedure may vary among health care providers and hospitals. What happens after the procedure?  You will lie down and rest for a few minutes. You may be offered juice or cookies.  Your blood pressure, heart rate, breathing rate, and blood oxygen level will be monitored until you leave the hospital or clinic.  You may have some cramping in your abdomen. This should go away after a few minutes.  It is up to you to get the results of your procedure. Ask your health care provider, or the department that is doing the procedure, when your results will be ready. Summary  Colposcopy is a procedure to examine the lowest part of the uterus (cervix), the vagina, and the area around the vaginal  opening (vulva) for abnormalities or signs of disease.  A biopsy may be done as part of the procedure.  After the procedure, you will remain lying down and will rest for a few minutes.  You may have some cramping in your abdomen. This should go away after a few minutes. This information is not intended to replace advice given to you by your health care provider. Make sure you discuss any questions you have with your health care provider. Document Revised: 09/30/2019 Document Reviewed: 09/30/2019 Elsevier Patient Education  2021 [employer]  Colposcopy, Care After This sheet gives you information about how to care for yourself after your procedure. Your doctor may also give you more specific instructions. If you have problems or questions, contact your doctor. What can I expect after the procedure? If you did not have a sample of your tissue taken out (did not have a biopsy), you may only have some spotting of blood for a few days. You can go back to your normal activities. If you had a sample of your tissue taken out, it is common to have:  Soreness and mild pain. These may last  for a few days.  A light-headed feeling.  Mild bleeding or fluid (discharge) coming from your vagina. The fluid will look dark and grainy. You may have this for a few days. The fluid may be caused by a liquid that was used during your procedure. You may need to wear a sanitary pad.  Spotting of blood for at least 48 hours after the procedure. Follow these instructions at home: Medicines  Take over-the-counter and prescription medicines only as told by your doctor.  Ask your doctor what medicines you can start taking again. This is very important if you take blood thinners. Activity  Limit your activity for the first day after your procedure as told by your doctor.  For at least 3 days, or for as long as told by your doctor, avoid: ? Douching. ? Using tampons. ? Having sex.  Return to your normal  activities as told by your doctor. Ask your doctor what activities are safe for you. General instructions  Drink enough fluid to keep your pee (urine) pale yellow.  Ask your doctor if you may take baths, swim, or use a hot tub. You may take showers.  If you use birth control (contraception), keep using it.  Keep all follow-up visits as told by your doctor. This is important.   Contact a doctor if:  You get a skin rash. Get help right away if:  You bleed a lot from your vagina. A lot of bleeding means you use more than one pad an hour for 2 hours in a row.  You have clumps of blood (blood clots) coming from your vagina.  You have a fever or chills.  You have signs of infection. This may be fluid coming from your vagina that is: ? Different than normal. ? Yellow. ? Bad-smelling.  You have very bad pain or cramps in your lower belly that do not get better with medicine.  You faint. Summary  If you did not have a sample of your tissue taken out, you may only have some spotting of blood for a few days. You can go back to your normal activities.  If you had a sample of your tissue taken out, it is common to have mild pain for a few days and spotting for 48 hours.  Avoid douching, using tampons, and having sex for at least 3 days after the procedure or for as long as told.  Get help right away if you have a lot of bleeding, very bad pain, or signs of infection. This information is not intended to replace advice given to you by your health care provider. Make sure you discuss any questions you have with your health care provider. Document Revised: 08/02/2020 Document Reviewed: 09/30/2019 Elsevier Patient Education  2021 [employer].

## 2020-11-17 NOTE — Progress Notes (Addendum)
Pt presents for colposcopy s/p LGSIL pap on 09/06/20 Pt wasn't aware of the procedure. She declined procedure today and requests self swab only.  TAB on 10/29/20 per pt

## 2020-11-17 NOTE — Progress Notes (Signed)
Patient ID: Beverly Marquez, female   DOB: 1991-06-16, 30 y.o.   MRN: [id number]  Chief Complaint  Patient presents with  . Follow-up    HPI Beverly Marquez is a 29 y.o. female.  History of LGSIL pap smear.  Also complains of vaginal discharge. HPI  Past Medical History:  Diagnosis Date  . No pertinent past medical history   . Trichomonas     Past Surgical History:  Procedure Laterality Date  . NO PAST SURGERIES      Family History  Problem Relation Age of Onset  . Hypertension Mother   . Healthy Father     Social History Social History   Tobacco Use  . Smoking status: Never Smoker  . Smokeless tobacco: Never Used  Vaping Use  . Vaping Use: Never used  Substance Use Topics  . Alcohol use: Yes  . Drug use: Yes    Types: Marijuana    No Known Allergies  Current Outpatient Medications  Medication Sig Dispense Refill  . Levonorgest-Eth Estrad-Fe Bisg (BALCOLTRA PO) Take by mouth.    . prenatal vitamin w/FE, FA (PRENATAL 1 + 1) 27-1 MG TABS tablet Take 1 tablet by mouth daily before breakfast. (Patient not taking: Reported on 11/17/2020) 30 tablet 11  . triamcinolone (KENALOG) 0.1 % Apply 1 application topically 2 (two) times daily. (Patient not taking: Reported on 11/17/2020) 30 g 0   No current facility-administered medications for this visit.    Review of Systems Review of Systems Constitutional: negative for fatigue and weight loss Respiratory: negative for cough and wheezing Cardiovascular: negative for chest pain, fatigue and palpitations Gastrointestinal: negative for abdominal pain and change in bowel habits Genitourinary: positive for  Integument/breast: negative for nipple discharge Musculoskeletal:negative for myalgias Neurological: negative for gait problems and tremors Behavioral/Psych: negative for abusive relationship, depression Endocrine: negative for temperature intolerance      Weight 152 lb (68.9 kg), last menstrual period 08/26/2020,  unknown if currently breastfeeding.  Physical Exam Physical Exam: Deferred   >50% of 15 min visit spent on counseling and coordination of care.   Data Reviewed Pap Smear  Assessment     1. Low grade squamous intraepith lesion on cytologic smear cervix (lgsil) - discussed abnormal pap smears and management, and all questions answer to patient's satisfaction - colposcopy scheduled  2. Vaginal discharge Rx: - Cervicovaginal ancillary only    Plan   Follow up in 2 weeks for colposcopy    Brock Bad, MD 11/17/2020 10:15 AM

## 2020-11-18 LAB — CERVICOVAGINAL ANCILLARY ONLY
Bacterial Vaginitis (gardnerella): POSITIVE — AB
Candida Glabrata: NEGATIVE
Candida Vaginitis: NEGATIVE
Chlamydia: NEGATIVE
Comment: NEGATIVE
Comment: NEGATIVE
Comment: NEGATIVE
Comment: NEGATIVE
Comment: NEGATIVE
Comment: NORMAL
Neisseria Gonorrhea: NEGATIVE
Trichomonas: NEGATIVE

## 2020-11-19 ENCOUNTER — Other Ambulatory Visit: Payer: Self-pay | Admitting: Obstetrics

## 2020-11-19 DIAGNOSIS — N760 Acute vaginitis: Secondary | ICD-10-CM

## 2020-11-19 DIAGNOSIS — B9689 Other specified bacterial agents as the cause of diseases classified elsewhere: Secondary | ICD-10-CM

## 2020-11-19 MED ORDER — METRONIDAZOLE 500 MG PO TABS: 500.0000 mg | ORAL_TABLET | Freq: Two times a day (BID) | ORAL | 2 refills | Status: DC

## 2020-12-02 ENCOUNTER — Ambulatory Visit (HOSPITAL_COMMUNITY): Payer: Self-pay

## 2020-12-07 ENCOUNTER — Other Ambulatory Visit: Payer: Self-pay

## 2020-12-07 ENCOUNTER — Ambulatory Visit (INDEPENDENT_AMBULATORY_CARE_PROVIDER_SITE_OTHER): Payer: Medicaid Other | Admitting: Obstetrics

## 2020-12-07 ENCOUNTER — Other Ambulatory Visit (HOSPITAL_COMMUNITY)
Admission: RE | Admit: 2020-12-07 | Discharge: 2020-12-07 | Disposition: A | Discharge status: Home / Self Care | Payer: Medicaid Other | Source: Ambulatory Visit | Attending: Obstetrics | Admitting: Obstetrics

## 2020-12-07 ENCOUNTER — Encounter: Payer: Self-pay | Admitting: Obstetrics

## 2020-12-07 DIAGNOSIS — R87612 Low grade squamous intraepithelial lesion on cytologic smear of cervix (LGSIL): Secondary | ICD-10-CM | POA: Insufficient documentation

## 2020-12-07 NOTE — Progress Notes (Signed)
GYN presents for COLPO LSIL on PAP.  UPT today is NEGATIVE

## 2020-12-07 NOTE — Progress Notes (Signed)
Colposcopy Procedure Note  Indications: Pap smear 3 months ago showed: low-grade squamous intraepithelial neoplasia (LGSIL - encompassing HPV,mild dysplasia,CIN I). The prior pap showed no abnormalities.  Prior cervical/vaginal disease: normal exam without visible pathology. Prior cervical treatment: no treatment.  Procedure Details  The risks and benefits of the procedure and Written informed consent obtained.  A time-out was performed confirming the patient, procedure and allergy status  Speculum placed in vagina and excellent visualization of cervix achieved, cervix swabbed x 3 with acetic acid solution.  Findings: Cervix: no visible lesions, no mosaicism, no punctation and no abnormal vasculature; SCJ visualized 360 degrees without lesions, endocervical curettage performed, cervical biopsies taken at 6 and 12 o'clock, specimen labelled and sent to pathology and hemostasis achieved with silver nitrate.   Vaginal inspection: normal without visible lesions. Vulvar colposcopy: vulvar colposcopy not performed.   Physical Exam   Specimens: ECC and Cervical Biopsies  Complications: none.  Plan: Specimens labelled and sent to Pathology. Will base further treatment on Pathology findings. Treatment options discussed with patient. Post biopsy instructions given to patient. Return to discuss Pathology results in 2 weeks.  Brock Bad, MD 12/07/2020 10:55 AM

## 2020-12-08 LAB — SURGICAL PATHOLOGY

## 2020-12-21 ENCOUNTER — Encounter: Payer: Self-pay | Admitting: Obstetrics

## 2020-12-21 ENCOUNTER — Telehealth (INDEPENDENT_AMBULATORY_CARE_PROVIDER_SITE_OTHER): Payer: Medicaid Other | Admitting: Obstetrics

## 2020-12-21 DIAGNOSIS — N870 Mild cervical dysplasia: Secondary | ICD-10-CM

## 2020-12-21 NOTE — Progress Notes (Signed)
Virtual F/U to discuss Colpo results.      TELEHEALTH GYNECOLOGY VISIT ENCOUNTER NOTE  Provider location: [facility] [employer] [facility] [place]   I connected with Beverly Marquez on 12/21/20 at  9:00 AM EST by telephone at home and verified that I am speaking with the correct person using two identifiers. Patient was unable to do MyChart audiovisual encounter due to technical difficulties, she tried several times.    I discussed the limitations, risks, security and privacy concerns of performing an evaluation and management service by telephone and the availability of in person appointments. I also discussed with the patient that there may be a patient responsible charge related to this service. The patient expressed understanding and agreed to proceed.   History:  Beverly Marquez is a 30 y.o. [phone] female being evaluated today for results of colposcopic biopsies. She denies any abnormal vaginal discharge, bleeding, pelvic pain or other concerns.       Past Medical History:  Diagnosis Date  . No pertinent past medical history   . Trichomonas    Past Surgical History:  Procedure Laterality Date  . NO PAST SURGERIES     The following portions of the patient's history were reviewed and updated as appropriate: allergies, current medications, past family history, past medical history, past social history, past surgical history and problem list.   Health Maintenance:  Abnormal pap ( LGSIL )  on 09-06-2020.  Colposcopy done on 12-07-2020, and biopsies revealed LGSIL.  Review of Systems:  Pertinent items noted in HPI and remainder of comprehensive ROS otherwise negative.  Physical Exam:   General:  Alert, oriented and cooperative.   Mental Status: Normal mood and affect perceived. Normal judgment and thought content.  Physical exam deferred due to nature of the encounter  Labs and Imaging  SURGICAL PATHOLOGY Surgical pathology( [place]/ [facility]) Collected: 12/07/20  1027  Lab status: Final-Edited  Resulting lab: [facility]  Value: SURGICAL PATHOLOGY  CASE: [id number]  PATIENT: Beverly Marquez  Surgical Pathology Report      Clinical History: LGSIL (cm)    FINAL MICROSCOPIC DIAGNOSIS:   A. ENDOCERVIX, CURETTAGE:  - Low grade squamous intraepithelial lesion (CIN1, mild dysplasia)  - Benign endocervical glandular epithelium   B. CERVIX, 6 AND 12 O'CLOCK, BIOPSY:  - Low grade squamous intraepithelial lesion (CIN1, mild dysplasia)    GROSS DESCRIPTION:   A: Received in formalin is blood tinged mucus that is entirely submitted  in one block. Volume: 1.2 x 1.2 x 0.3 cm   B: Received in formalin are tan, soft tissue fragments that are  submitted in toto. Number: 3 size: 0.3-0.6 cm blocks: 1 (GRP 12/07/2020)      Final Diagnosis performed by Manning Charity, MD.  Electronically signed  12/08/2020  Technical and / or Professional components performed at [facility]. [facility], [address], [place], [place] [address].  Immunohistochemistry Technical component (if applicable) was performed  at [facility]. [address],  [place], [place] [address].  IMMUNOHISTOCHEMISTRY DISCLAIMER (if applicable):  Some of these immunohistochemical stains may have been developed and the  performance characteristics determine by [facility]. Some  may not have been cleared or approved by the U.S. Food and Drug  Administration. The FDA has determined that such clearance or approval  is not necessary. This test is used for clinical purposes. It should not  be regarded as investigational or for research. This laboratory is  certified  under the Clinical Laboratory Improvement Amendments of 1988  (CLIA-88) as qualified to perform high complexity clinical laboratory  testing. The controls stained appropriately.   *Additional information available - result note      Results Surgical  pathology( [place]/ [facility]) (Order [id number])  MyChart Results Release  MyChart Status: Active Results Release    Surgical pathology( / [facility]) Order: [id number]  Status: Edited Result - FINAL    Visible to patient: Yes (seen)    Next appt: None    Dx: LGSIL on Pap smear of cervix    2 Result Notes   Component 2 wk ago  SURGICAL PATHOLOGY SURGICAL PATHOLOGY  CASE: [id number]  PATIENT: Beverly Marquez  Surgical Pathology Report      Clinical History: LGSIL (cm)    FINAL MICROSCOPIC DIAGNOSIS:   A. ENDOCERVIX, CURETTAGE:  - Low grade squamous intraepithelial lesion (CIN1, mild dysplasia)  - Benign endocervical glandular epithelium   B. CERVIX, 6 AND 12 O'CLOCK, BIOPSY:  - Low grade squamous intraepithelial lesion (CIN1, mild dysplasia)    GROSS DESCRIPTION:   A: Received in formalin is blood tinged mucus that is entirely submitted  in one block. Volume: 1.2 x 1.2 x 0.3 cm   B: Received in formalin are tan, soft tissue fragments that are  submitted in toto. Number: 3 size: 0.3-0.6 cm blocks: 1 (GRP 12/07/2020)      Final Diagnosis performed by Manning Charity, MD.  Electronically signed  12/08/2020  Technical and / or Professional components performed at [facility]. [facility], [address], [place], [place] [address].  Immunohistochemistry Technical component (if applicable) was performed  at [facility]. [address],  [place], [place] [address].  IMMUNOHISTOCHEMISTRY DISCLAIMER (if applicable):  Some of these immunohistochemical stains may have been developed and the  performance characteristics determine by [facility]. Some  may not have been cleared or approved by the U.S. Food and Drug  Administration. The FDA has determined that such clearance or approval  is not necessary. This test is used for clinical purposes. It should not  be regarded as investigational or  for research. This laboratory is  certified under the Clinical Laboratory Improvement Amendments of 1988  (CLIA-88) as qualified to perform high complexity clinical laboratory  testing. The controls stained appropriately.   Resulting Agency [facility]        Assessment and Plan:     1. Dysplasia of cervix, low grade (CIN 1) - repeat pap in 1 year      I discussed the assessment and treatment plan with the patient. The patient was provided an opportunity to ask questions and all were answered. The patient agreed with the plan and demonstrated an understanding of the instructions.   The patient was advised to call back or seek an in-person evaluation/go to the ED if the symptoms worsen or if the condition fails to improve as anticipated.  I provided 15 minutes of non-face-to-face time during this encounter.   Coral Ceo, MD [facility], [facility] 12/21/20

## 2020-12-31 ENCOUNTER — Ambulatory Visit (HOSPITAL_COMMUNITY): Payer: Self-pay

## 2021-01-02 ENCOUNTER — Ambulatory Visit (HOSPITAL_COMMUNITY): Payer: Self-pay

## 2021-01-09 ENCOUNTER — Ambulatory Visit (HOSPITAL_COMMUNITY)
Admission: RE | Admit: 2021-01-09 | Discharge: 2021-01-09 | Disposition: A | Discharge status: Home / Self Care | Payer: Medicaid Other | Source: Ambulatory Visit | Attending: Family Medicine | Admitting: Family Medicine

## 2021-01-09 ENCOUNTER — Encounter (HOSPITAL_COMMUNITY): Payer: Self-pay

## 2021-01-09 ENCOUNTER — Other Ambulatory Visit: Payer: Self-pay

## 2021-01-09 VITALS — BP 115/71 | HR 72 | Temp 98.2°F | Resp 16

## 2021-01-09 DIAGNOSIS — Z113 Encounter for screening for infections with a predominantly sexual mode of transmission: Secondary | ICD-10-CM

## 2021-01-09 NOTE — Discharge Instructions (Addendum)
We have sent testing for sexually transmitted infections. We will notify you of any positive results once they are received. If required, we will prescribe any medications you might need.  Please refrain from all sexual activity for at least the next seven days.  

## 2021-01-09 NOTE — ED Triage Notes (Signed)
Pt presents for STD testing. Denies any symptoms at this time.  

## 2021-01-09 NOTE — ED Provider Notes (Signed)
  [facility]   [id number] 01/09/21 Arrival Time: 0944  ASSESSMENT & PLAN:  1. Screening for STDs (sexually transmitted diseases)    Discharge Instructions     We have sent testing for sexually transmitted infections. We will notify you of any positive results once they are received. If required, we will prescribe any medications you might need.  Please refrain from all sexual activity for at least the next seven days.  Without s/s of PID.  Labs Reviewed  CERVICOVAGINAL ANCILLARY ONLY   Will notify of any positive results. Instructed to refrain from sexual activity for at least seven days.  Reviewed expectations re: course of current medical issues. Questions answered. Outlined signs and symptoms indicating need for more acute intervention. Patient verbalized understanding. After Visit Summary given.   SUBJECTIVE:  Beverly Marquez is a 30 y.o. female who requests STD screening. No symptoms. New female partner. Patient's last menstrual period was 01/06/2021.   OBJECTIVE:  Vitals:   01/09/21 1002  BP: 115/71  Pulse: 72  Resp: 16  Temp: 98.2 F (36.8 C)  TempSrc: Oral  SpO2: 96%     General appearance: alert, cooperative, appears stated age and no distress Lungs: unlabored respirations; speaks full sentences without difficulty GU: deferred Skin: warm and dry Psychological: alert and cooperative; normal mood and affect.    Labs Reviewed  CERVICOVAGINAL ANCILLARY ONLY    No Known Allergies  Past Medical History:  Diagnosis Date  . No pertinent past medical history   . Trichomonas    Family History  Problem Relation Age of Onset  . Hypertension Mother   . Healthy Father    Social History   Socioeconomic History  . Marital status: Single    Spouse name: Not on file  . Number of children: 2  . Years of education: Not on file  . Highest education level: Not on file  Occupational History  . Not on file  Tobacco Use  . Smoking status:  Never Smoker  . Smokeless tobacco: Never Used  Vaping Use  . Vaping Use: Never used  Substance and Sexual Activity  . Alcohol use: Yes  . Drug use: Yes    Types: Marijuana  . Sexual activity: Yes    Partners: Male    Birth control/protection: None  Other Topics Concern  . Not on file  Social History Narrative   ** Merged History Encounter **       Social Determinants of Health   Financial Resource Strain: Not on file  Food Insecurity: Not on file  Transportation Needs: Not on file  Physical Activity: Not on file  Stress: Not on file  Social Connections: Not on file  Intimate Partner Violence: Not on file          Mardella Layman, MD 01/09/21 1037

## 2021-01-10 ENCOUNTER — Telehealth (HOSPITAL_COMMUNITY): Payer: Self-pay | Admitting: Emergency Medicine

## 2021-01-10 LAB — CERVICOVAGINAL ANCILLARY ONLY
Bacterial Vaginitis (gardnerella): POSITIVE — AB
Candida Glabrata: NEGATIVE
Candida Vaginitis: POSITIVE — AB
Chlamydia: NEGATIVE
Comment: NEGATIVE
Comment: NEGATIVE
Comment: NEGATIVE
Comment: NEGATIVE
Comment: NEGATIVE
Comment: NORMAL
Neisseria Gonorrhea: NEGATIVE
Trichomonas: NEGATIVE

## 2021-01-10 MED ORDER — METRONIDAZOLE 500 MG PO TABS: 500.0000 mg | ORAL_TABLET | Freq: Two times a day (BID) | ORAL | 0 refills | Status: DC

## 2021-01-10 MED ORDER — FLUCONAZOLE 150 MG PO TABS: 150.0000 mg | ORAL_TABLET | Freq: Once | ORAL | 0 refills | Status: AC

## 2021-02-21 ENCOUNTER — Other Ambulatory Visit: Payer: Self-pay

## 2021-02-21 ENCOUNTER — Ambulatory Visit (HOSPITAL_COMMUNITY)
Admission: RE | Admit: 2021-02-21 | Discharge: 2021-02-21 | Disposition: A | Discharge status: Home / Self Care | Payer: Medicaid Other | Source: Ambulatory Visit | Attending: Emergency Medicine | Admitting: Emergency Medicine

## 2021-02-21 ENCOUNTER — Encounter (HOSPITAL_COMMUNITY): Payer: Self-pay

## 2021-02-21 DIAGNOSIS — Z793 Long term (current) use of hormonal contraceptives: Secondary | ICD-10-CM | POA: Insufficient documentation

## 2021-02-21 DIAGNOSIS — Z3202 Encounter for pregnancy test, result negative: Secondary | ICD-10-CM | POA: Insufficient documentation

## 2021-02-21 DIAGNOSIS — Z113 Encounter for screening for infections with a predominantly sexual mode of transmission: Secondary | ICD-10-CM | POA: Insufficient documentation

## 2021-02-21 DIAGNOSIS — N760 Acute vaginitis: Secondary | ICD-10-CM | POA: Diagnosis not present

## 2021-02-21 DIAGNOSIS — N898 Other specified noninflammatory disorders of vagina: Secondary | ICD-10-CM | POA: Diagnosis present

## 2021-02-21 DIAGNOSIS — N390 Urinary tract infection, site not specified: Secondary | ICD-10-CM | POA: Diagnosis not present

## 2021-02-21 DIAGNOSIS — B3749 Other urogenital candidiasis: Secondary | ICD-10-CM | POA: Diagnosis not present

## 2021-02-21 DIAGNOSIS — B9689 Other specified bacterial agents as the cause of diseases classified elsewhere: Secondary | ICD-10-CM | POA: Diagnosis not present

## 2021-02-21 LAB — POCT URINALYSIS DIPSTICK, ED / UC
Glucose, UA: NEGATIVE mg/dL
Leukocytes,Ua: NEGATIVE
Nitrite: NEGATIVE
Protein, ur: NEGATIVE mg/dL
Specific Gravity, Urine: >=1.03 (ref 1.005–1.030)
Urobilinogen, UA: 0.2 mg/dL (ref 0.0–1.0)
pH: 5.5 (ref 5.0–8.0)

## 2021-02-21 LAB — POC URINE PREG, ED: Preg Test, Ur: NEGATIVE

## 2021-02-21 MED ORDER — FLUCONAZOLE 150 MG PO TABS: 150.0000 mg | ORAL_TABLET | Freq: Every day | ORAL | 0 refills | Status: DC

## 2021-02-21 MED ORDER — METRONIDAZOLE 500 MG PO TABS: 500.0000 mg | ORAL_TABLET | Freq: Two times a day (BID) | ORAL | 0 refills | Status: AC

## 2021-02-21 NOTE — Discharge Instructions (Signed)
Take the Flagyl one pill twice a day for the next 7 days.  Take one diflucan today and another in 3 days if you are still having symptoms.   We will contact you with the results from your lab work and any additional treatment.    Do not have sex while taking undergoing treatment for STI.  Make sure that all of your partners get tested and treated.   Use a condom or other barrier method for all sexual encounters.    Return or go to the Emergency Department if symptoms worsen or do not improve in the next few days.

## 2021-02-21 NOTE — ED Triage Notes (Signed)
Pt presents with urinary frequency X 1 week; pt requests STD testing as well with no known symptoms.

## 2021-02-21 NOTE — ED Provider Notes (Signed)
[facility]    CSN: [id number] Arrival date & time: 02/21/21  1647      History   Chief Complaint Chief Complaint  Patient presents with  . APPOINTMENT : UTI    HPI Beverly Marquez is a 30 y.o. female.   Patient here for evaluation of urinary frequency, vaginal discharge, and vaginal odor for the past week.  Reports noticing a fishy odor and states symptoms are similar to when she has had BV in the past.  Also reports a thick "cottage cheese" like discharge from vagina.  Patient also requesting STD screening but denies any known exposures.  Denies any trauma, injury, or other precipitating event.  Denies any specific alleviating or aggravating factors.  Denies any fevers, chest pain, shortness of breath, N/V/D, numbness, tingling, weakness, abdominal pain, or headaches.   ROS: As per HPI, all other pertinent ROS negative    The history is provided by the patient.    Past Medical History:  Diagnosis Date  . No pertinent past medical history   . Trichomonas     Patient Active Problem List   Diagnosis Date Noted  . Supervision of other normal pregnancy, antepartum 11/09/2020  . Contraception 09/12/2016    Past Surgical History:  Procedure Laterality Date  . NO PAST SURGERIES      OB History    Gravida  7   Para  3   Term  3   Preterm  0   AB  4   Living  3     SAB  1   IAB  2   Ectopic  0   Multiple      Live Births  3            Home Medications    Prior to Admission medications   Medication Sig Start Date End Date Taking? Authorizing Provider  fluconazole (DIFLUCAN) 150 MG tablet Take 1 tablet (150 mg total) by mouth daily. Take one tablet now and one in 3 days if you are still having symptoms 02/21/21  Yes Ivette Loyal, NP  Levonorgest-Eth Estrad-Fe Bisg (BALCOLTRA PO) Take by mouth.    [provider]  metroNIDAZOLE (FLAGYL) 500 MG tablet Take 1 tablet (500 mg total) by mouth 2 (two) times daily for 7 days.  02/21/21 02/28/21  Ivette Loyal, NP    Family History Family History  Problem Relation Age of Onset  . Hypertension Mother   . Healthy Father     Social History Social History   Tobacco Use  . Smoking status: Never Smoker  . Smokeless tobacco: Never Used  Vaping Use  . Vaping Use: Never used  Substance Use Topics  . Alcohol use: Yes  . Drug use: Yes    Types: Marijuana     Allergies   Patient has no known allergies.   Review of Systems Review of Systems  Genitourinary: Positive for frequency and vaginal discharge.  All other systems reviewed and are negative.    Physical Exam Triage Vital Signs ED Triage Vitals  Enc Vitals Group     BP 02/21/21 1712 (!) 115/57     Pulse Rate 02/21/21 1712 93     Resp 02/21/21 1712 17     Temp 02/21/21 1712 98.8 F (37.1 C)     Temp Source 02/21/21 1712 Oral     SpO2 02/21/21 1712 96 %     Weight --      Height --  Head Circumference --      Peak Flow --      Pain Score 02/21/21 1710 0     Pain Loc --      Pain Edu? --      Excl. in GC? --    No data found.  Updated Vital Signs BP (!) 115/57 (BP Location: Right Arm)   Pulse 93   Temp 98.8 F (37.1 C) (Oral)   Resp 17   LMP  (LMP Unknown)   SpO2 96%   Visual Acuity Right Eye Distance:   Left Eye Distance:   Bilateral Distance:    Right Eye Near:   Left Eye Near:    Bilateral Near:     Physical Exam Vitals and nursing note reviewed.  Constitutional:      General: She is not in acute distress.    Appearance: Normal appearance. She is not ill-appearing, toxic-appearing or diaphoretic.  HENT:     Head: Normocephalic and atraumatic.  Eyes:     Conjunctiva/sclera: Conjunctivae normal.  Cardiovascular:     Rate and Rhythm: Normal rate.     Pulses: Normal pulses.  Pulmonary:     Effort: Pulmonary effort is normal.  Abdominal:     General: Abdomen is flat.  Genitourinary:    Comments: declines Musculoskeletal:        General: Normal range of  motion.     Cervical back: Normal range of motion.  Skin:    General: Skin is warm and dry.  Neurological:     General: No focal deficit present.     Mental Status: She is alert and oriented to person, place, and time.  Psychiatric:        Mood and Affect: Mood normal.      UC Treatments / Results  Labs (all labs ordered are listed, but only abnormal results are displayed) Labs Reviewed  POCT URINALYSIS DIPSTICK, ED / UC - Abnormal; Notable for the following components:      Result Value   Bilirubin Urine SMALL (*)    Ketones, ur TRACE (*)    Hgb urine dipstick TRACE (*)    All other components within normal limits  POC URINE PREG, ED  CERVICOVAGINAL ANCILLARY ONLY    EKG   Radiology No results found.  Procedures Procedures (including critical care time)  Medications Ordered in UC Medications - No data to display  Initial Impression / Assessment and Plan / UC Course  I have reviewed the triage vital signs and the nursing notes.  Pertinent labs & imaging results that were available during my care of the patient were reviewed by me and considered in my medical decision making (see chart for details).    Assessment negative for red flags or concerns.  Self swab obtained.  Will treat for BV and yeast based on symptoms.  Urinalysis with no signs of urinary tract infection, pregnancy test negative.  Flagyl twice daily for 7 days and Diflucan once today with a repeat in 3 days if symptoms do not improve.  Encourage fluids and rest.  Discussed safe sex practices including condom use or other barrier methods.  Follow-up as needed.  Final Clinical Impressions(s) / UC Diagnoses   Final diagnoses:  None     Discharge Instructions     Take the Flagyl one pill twice a day for the next 7 days.  Take one diflucan today and another in 3 days if you are still having symptoms.   We will contact you  with the results from your lab work and any additional treatment.    Do not  have sex while taking undergoing treatment for STI.  Make sure that all of your partners get tested and treated.   Use a condom or other barrier method for all sexual encounters.    Return or go to the Emergency Department if symptoms worsen or do not improve in the next few days.      ED Prescriptions    Medication Sig Dispense Auth. Provider   metroNIDAZOLE (FLAGYL) 500 MG tablet Take 1 tablet (500 mg total) by mouth 2 (two) times daily for 7 days. 14 tablet Ivette Loyal, NP   fluconazole (DIFLUCAN) 150 MG tablet Take 1 tablet (150 mg total) by mouth daily. Take one tablet now and one in 3 days if you are still having symptoms 2 tablet Ivette Loyal, NP     PDMP not reviewed this encounter.   Ivette Loyal, NP 02/21/21 [phone]

## 2021-02-22 LAB — CERVICOVAGINAL ANCILLARY ONLY
Bacterial Vaginitis (gardnerella): POSITIVE — AB
Candida Glabrata: NEGATIVE
Candida Vaginitis: NEGATIVE
Chlamydia: NEGATIVE
Comment: NEGATIVE
Comment: NEGATIVE
Comment: NEGATIVE
Comment: NEGATIVE
Comment: NEGATIVE
Comment: NORMAL
Neisseria Gonorrhea: NEGATIVE
Trichomonas: NEGATIVE

## 2021-02-27 ENCOUNTER — Other Ambulatory Visit: Payer: Self-pay | Admitting: Obstetrics

## 2021-02-27 DIAGNOSIS — Z30011 Encounter for initial prescription of contraceptive pills: Secondary | ICD-10-CM

## 2021-03-01 ENCOUNTER — Ambulatory Visit (HOSPITAL_COMMUNITY): Payer: Self-pay

## 2021-03-02 ENCOUNTER — Ambulatory Visit (HOSPITAL_COMMUNITY): Payer: Self-pay

## 2021-04-15 ENCOUNTER — Ambulatory Visit (HOSPITAL_COMMUNITY)
Admission: RE | Admit: 2021-04-15 | Discharge: 2021-04-15 | Disposition: A | Discharge status: Home / Self Care | Payer: Medicaid Other | Source: Ambulatory Visit | Attending: Family Medicine | Admitting: Family Medicine

## 2021-04-15 ENCOUNTER — Other Ambulatory Visit: Payer: Self-pay

## 2021-04-15 VITALS — BP 104/66 | HR 61 | Temp 99.0°F | Resp 18

## 2021-04-15 DIAGNOSIS — J00 Acute nasopharyngitis [common cold]: Secondary | ICD-10-CM | POA: Insufficient documentation

## 2021-04-15 DIAGNOSIS — J3489 Other specified disorders of nose and nasal sinuses: Secondary | ICD-10-CM | POA: Diagnosis not present

## 2021-04-15 MED ORDER — FLUTICASONE PROPIONATE 50 MCG/ACT NA SUSP: 1.0000 | Freq: Two times a day (BID) | NASAL | 2 refills | Status: DC

## 2021-04-15 MED ORDER — MUPIROCIN 2 % EX OINT: 1.0000 "application " | TOPICAL_OINTMENT | Freq: Two times a day (BID) | CUTANEOUS | 0 refills | Status: DC

## 2021-04-15 NOTE — ED Triage Notes (Signed)
Pt presents with nose pain x 4 days.  States her nose is congested when laying down and states the left nostril has been itching and feel sore.   Pt presents here for STD testing. C/o vaginal itching.

## 2021-04-15 NOTE — ED Provider Notes (Signed)
[facility]    CSN: [id number] Arrival date & time: 04/15/21  1048      History   Chief Complaint Chief Complaint  Patient presents with   Nose Problem   SEXUALLY TRANSMITTED DISEASE    HPI Beverly Marquez is a 30 y.o. female.   Patient here today with 3 to 4-day history of runny nose, sore area in left nostril.  She states that she has been using warm compresses to the sore area and this has seemed to help dramatically improve the sore area in the nose.  She is not as worried about the symptoms anymore since yesterday the symptoms improve.  Not tried any medicines for this and states this happened intermittently over the past few years.  Denies history of seasonal allergies or known nasal polyps.  She is also requesting STD screening.  She states she is asymptomatic and has had no new exposures.  Declines HIV and syphilis labs today.   Past Medical History:  Diagnosis Date   No pertinent past medical history    Trichomonas     Patient Active Problem List   Diagnosis Date Noted   Supervision of other normal pregnancy, antepartum 11/09/2020   Contraception 09/12/2016    Past Surgical History:  Procedure Laterality Date   NO PAST SURGERIES      OB History     Gravida  7   Para  3   Term  3   Preterm  0   AB  4   Living  3      SAB  1   IAB  2   Ectopic  0   Multiple      Live Births  3            Home Medications    Prior to Admission medications   Medication Sig Start Date End Date Taking? Authorizing Provider  fluticasone (FLONASE) 50 MCG/ACT nasal spray Place 1 spray into both nostrils 2 (two) times daily. Spray upward and out toward outer corners of eyes 04/15/21  Yes Particia Nearing, PA-C  mupirocin ointment (BACTROBAN) 2 % Apply 1 application topically 2 (two) times daily. 04/15/21  Yes Particia Nearing, PA-C  BALCOLTRA 0.1-20 MG-MCG(21) TABS TAKE 1 TABLET BY MOUTH DAILY 02/28/21   Brock Bad, MD   fluconazole (DIFLUCAN) 150 MG tablet Take 1 tablet (150 mg total) by mouth daily. Take one tablet now and one in 3 days if you are still having symptoms 02/21/21   Ivette Loyal, NP  Levonorgest-Eth Estrad-Fe Bisg (BALCOLTRA PO) Take by mouth.    [provider]    Family History Family History  Problem Relation Age of Onset   Hypertension Mother    Healthy Father     Social History Social History   Tobacco Use   Smoking status: Never   Smokeless tobacco: Never  Vaping Use   Vaping Use: Never used  Substance Use Topics   Alcohol use: Yes   Drug use: Yes    Types: Marijuana     Allergies   Patient has no known allergies.   Review of Systems Review of Systems Per HPI  Physical Exam Triage Vital Signs ED Triage Vitals  Enc Vitals Group     BP 04/15/21 1208 104/66     Pulse Rate 04/15/21 1208 61     Resp 04/15/21 1208 18     Temp 04/15/21 1208 99 F (37.2 C)     Temp Source  04/15/21 1208 Oral     SpO2 04/15/21 1208 96 %     Weight --      Height --      Head Circumference --      Peak Flow --      Pain Score 04/15/21 1215 0     Pain Loc --      Pain Edu? --      Excl. in GC? --    No data found.  Updated Vital Signs BP 104/66   Pulse 61   Temp 99 F (37.2 C) (Oral)   Resp 18   LMP 04/15/2021 (Exact Date)   SpO2 96%   Visual Acuity Right Eye Distance:   Left Eye Distance:   Bilateral Distance:    Right Eye Near:   Left Eye Near:    Bilateral Near:     Physical Exam Vitals and nursing note reviewed.  Constitutional:      Appearance: Normal appearance. She is not ill-appearing.  HENT:     Head: Atraumatic.     Nose:     Comments: Nasal mucosa boggy, erythematous, edematous b/l, small area of abrasion/irritation to left lateral nasal mucosa    Mouth/Throat:     Mouth: Mucous membranes are moist.     Pharynx: Oropharynx is clear. No posterior oropharyngeal erythema.  Eyes:     Extraocular Movements: Extraocular movements intact.      Conjunctiva/sclera: Conjunctivae normal.  Cardiovascular:     Rate and Rhythm: Normal rate and regular rhythm.     Heart sounds: Normal heart sounds.  Pulmonary:     Effort: Pulmonary effort is normal.     Breath sounds: Normal breath sounds.  Abdominal:     General: Bowel sounds are normal. There is no distension.     Palpations: Abdomen is soft.     Tenderness: There is no abdominal tenderness. There is no guarding.  Genitourinary:    Comments: GU exam deferred, self swab performed Musculoskeletal:        General: Normal range of motion.     Cervical back: Normal range of motion and neck supple.  Skin:    General: Skin is warm and dry.  Neurological:     Mental Status: She is alert and oriented to person, place, and time.  Psychiatric:        Mood and Affect: Mood normal.        Thought Content: Thought content normal.        Judgment: Judgment normal.   UC Treatments / Results  Labs (all labs ordered are listed, but only abnormal results are displayed) Labs Reviewed  CERVICOVAGINAL ANCILLARY ONLY    EKG   Radiology No results found.  Procedures Procedures (including critical care time)  Medications Ordered in UC Medications - No data to display  Initial Impression / Assessment and Plan / UC Course  I have reviewed the triage vital signs and the nursing notes.  Pertinent labs & imaging results that were available during my care of the patient were reviewed by me and considered in my medical decision making (see chart for details).     Suspect irritation and nasal mucosa related to uncontrolled seasonal allergies.  Discussed antihistamine, Flonase twice daily with proper use discussed, Bactroban ointment with a Q-tip gently to inside of nostrils as needed.  Vaginal swab pending for STD screening.  She declines the blood work for HIV and syphilis today.  Discussed safe sexual practices and abstinence until results return.  Return for acutely worsening  symptoms.  Final Clinical Impressions(s) / UC Diagnoses   Final diagnoses:  Acute rhinitis  Nasal lesion   Discharge Instructions   None    ED Prescriptions     Medication Sig Dispense Auth. Provider   fluticasone (FLONASE) 50 MCG/ACT nasal spray Place 1 spray into both nostrils 2 (two) times daily. Spray upward and out toward outer corners of eyes 16 g Particia Nearing, PA-C   mupirocin ointment (BACTROBAN) 2 % Apply 1 application topically 2 (two) times daily. 22 g Particia Nearing, [place]      PDMP not reviewed this encounter.   Particia Nearing, [place] 04/15/21 1348

## 2021-04-18 ENCOUNTER — Telehealth (HOSPITAL_COMMUNITY): Payer: Self-pay | Admitting: Emergency Medicine

## 2021-04-18 LAB — CERVICOVAGINAL ANCILLARY ONLY
Bacterial Vaginitis (gardnerella): NEGATIVE
Candida Glabrata: NEGATIVE
Candida Vaginitis: POSITIVE — AB
Chlamydia: NEGATIVE
Comment: NEGATIVE
Comment: NEGATIVE
Comment: NEGATIVE
Comment: NEGATIVE
Comment: NEGATIVE
Comment: NORMAL
Neisseria Gonorrhea: NEGATIVE
Trichomonas: NEGATIVE

## 2021-04-18 MED ORDER — FLUCONAZOLE 150 MG PO TABS: 150.0000 mg | ORAL_TABLET | Freq: Once | ORAL | 0 refills | Status: AC

## 2021-05-10 ENCOUNTER — Other Ambulatory Visit: Payer: Self-pay

## 2021-05-10 ENCOUNTER — Ambulatory Visit (HOSPITAL_COMMUNITY)
Admission: EM | Admit: 2021-05-10 | Discharge: 2021-05-10 | Disposition: A | Discharge status: Home / Self Care | Payer: Medicaid Other | Attending: Urgent Care | Admitting: Urgent Care

## 2021-05-10 ENCOUNTER — Encounter (HOSPITAL_COMMUNITY): Payer: Self-pay

## 2021-05-10 ENCOUNTER — Ambulatory Visit (HOSPITAL_COMMUNITY): Payer: Medicaid Other

## 2021-05-10 DIAGNOSIS — Z8742 Personal history of other diseases of the female genital tract: Secondary | ICD-10-CM | POA: Insufficient documentation

## 2021-05-10 DIAGNOSIS — R2231 Localized swelling, mass and lump, right upper limb: Secondary | ICD-10-CM | POA: Insufficient documentation

## 2021-05-10 DIAGNOSIS — N898 Other specified noninflammatory disorders of vagina: Secondary | ICD-10-CM | POA: Diagnosis not present

## 2021-05-10 DIAGNOSIS — M67431 Ganglion, right wrist: Secondary | ICD-10-CM | POA: Diagnosis not present

## 2021-05-10 LAB — HIV ANTIBODY (ROUTINE TESTING W REFLEX): HIV Screen 4th Generation wRfx: NONREACTIVE

## 2021-05-10 MED ORDER — METRONIDAZOLE 0.75 % VA GEL: 1.0000 | Freq: Every day | VAGINAL | 0 refills | Status: DC

## 2021-05-10 MED ORDER — FLUCONAZOLE 150 MG PO TABS: 150.0000 mg | ORAL_TABLET | ORAL | 0 refills | Status: DC

## 2021-05-10 NOTE — ED Triage Notes (Signed)
Pt in with c/o knot on her right wrist that has been there for a while states she has intermittent pain in the area  Pt also requesting std testing and bloodwork  States she thinks she has BV, she gets it recurrently

## 2021-05-10 NOTE — ED Provider Notes (Signed)
[facility]   MRN: [id number] DOB: Nov 12, 1990  Subjective:   Beverly Marquez is a 30 y.o. female presenting for recommendations for a longstanding knot over her right wrist.  Reports that it is gotten bigger over time can also cause her some pain.  Denies drainage, fever, erythema, trauma.  Most the time it does not bother her but wants to know who she can go to to have it removed.  She is also had 1 week history of recurrent vaginal odor that is very consistent when she has bacterial vaginosis.  Would like to get treatment for this.  She does very well with MetroGel and would also like fluconazole to help her with yeast infection which she gets together with BV.  Denies fever, nausea, vomiting, abdominal or pelvic pain, dysuria, urinary frequency, vaginal discharge, genital rash.  She would like to make sure she does not have any STIs.  Is more of a precaution as she is having unprotected sex with 1 female partner.  Does not want to be tested for pregnancy, states that she took one 2 days ago and was negative.  No current facility-administered medications for this encounter.  Current Outpatient Medications:    BALCOLTRA 0.1-20 MG-MCG(21) TABS, TAKE 1 TABLET BY MOUTH DAILY, Disp: 28 tablet, Rfl: 11   fluticasone (FLONASE) 50 MCG/ACT nasal spray, Place 1 spray into both nostrils 2 (two) times daily. Spray upward and out toward outer corners of eyes, Disp: 16 g, Rfl: 2   Levonorgest-Eth Estrad-Fe Bisg (BALCOLTRA PO), Take by mouth., Disp: , Rfl:    mupirocin ointment (BACTROBAN) 2 %, Apply 1 application topically 2 (two) times daily., Disp: 22 g, Rfl: 0   No Known Allergies  Past Medical History:  Diagnosis Date   No pertinent past medical history    Trichomonas      Past Surgical History:  Procedure Laterality Date   NO PAST SURGERIES      Family History  Problem Relation Age of Onset   Hypertension Mother    Healthy Father     Social History   Tobacco Use    Smoking status: Never   Smokeless tobacco: Never  Vaping Use   Vaping Use: Never used  Substance Use Topics   Alcohol use: Yes   Drug use: Yes    Types: Marijuana    ROS   Objective:   Vitals: BP 112/75 (BP Location: Right Arm)   Pulse 72   Temp 99 F (37.2 C) (Oral)   Resp 18   LMP 04/15/2021 (Exact Date)   SpO2 97%   Physical Exam Constitutional:      General: She is not in acute distress.    Appearance: Normal appearance. She is well-developed. She is not ill-appearing, toxic-appearing or diaphoretic.  HENT:     Head: Normocephalic and atraumatic.     Nose: Nose normal.     Mouth/Throat:     Mouth: Mucous membranes are moist.     Pharynx: Oropharynx is clear.  Eyes:     General: No scleral icterus.       Right eye: No discharge.        Left eye: No discharge.     Extraocular Movements: Extraocular movements intact.     Conjunctiva/sclera: Conjunctivae normal.     Pupils: Pupils are equal, round, and reactive to light.  Cardiovascular:     Rate and Rhythm: Normal rate.  Pulmonary:     Effort: Pulmonary effort is normal.  Musculoskeletal:       Hands:  Skin:    General: Skin is warm and dry.  Neurological:     General: No focal deficit present.     Mental Status: She is alert and oriented to person, place, and time.  Psychiatric:        Mood and Affect: Mood normal.        Behavior: Behavior normal.        Thought Content: Thought content normal.        Judgment: Judgment normal.     Assessment and Plan :   PDMP not reviewed this encounter.  1. Vaginal odor   2. History of vaginitis   3. Mass of right wrist   4. Ganglion cyst of wrist, right     Recommended [facility] to have an evaluation and consultation for ganglion cyst.  We will use MetroGel for recurrent bacterial vaginosis and fluconazole for antibiotic associated yeast infection.  Labs pending, will treat as appropriate otherwise.  Counseled patient on potential for adverse  effects with medications prescribed/recommended today, ER and return-to-clinic precautions discussed, patient verbalized understanding.    Wallis Bamberg, [place] 05/10/21 1909

## 2021-05-11 LAB — RPR: RPR Ser Ql: NONREACTIVE

## 2021-05-12 LAB — CERVICOVAGINAL ANCILLARY ONLY
Bacterial Vaginitis (gardnerella): POSITIVE — AB
Candida Glabrata: NEGATIVE
Candida Vaginitis: NEGATIVE
Chlamydia: NEGATIVE
Comment: NEGATIVE
Comment: NEGATIVE
Comment: NEGATIVE
Comment: NEGATIVE
Comment: NEGATIVE
Comment: NORMAL
Neisseria Gonorrhea: NEGATIVE
Trichomonas: NEGATIVE

## 2021-05-17 DIAGNOSIS — M25531 Pain in right wrist: Secondary | ICD-10-CM | POA: Diagnosis not present

## 2021-05-26 ENCOUNTER — Ambulatory Visit (HOSPITAL_COMMUNITY): Payer: Medicaid Other

## 2021-05-28 ENCOUNTER — Ambulatory Visit (HOSPITAL_COMMUNITY): Payer: Medicaid Other

## 2021-05-30 ENCOUNTER — Ambulatory Visit (HOSPITAL_COMMUNITY): Payer: Medicaid Other

## 2021-06-01 ENCOUNTER — Ambulatory Visit (HOSPITAL_COMMUNITY): Payer: Medicaid Other

## 2021-06-05 ENCOUNTER — Other Ambulatory Visit: Payer: Self-pay

## 2021-06-05 ENCOUNTER — Encounter (HOSPITAL_COMMUNITY): Payer: Self-pay

## 2021-06-05 ENCOUNTER — Ambulatory Visit (HOSPITAL_COMMUNITY)
Admission: RE | Admit: 2021-06-05 | Discharge: 2021-06-05 | Disposition: A | Discharge status: Home / Self Care | Payer: Medicaid Other | Source: Ambulatory Visit | Attending: Sports Medicine | Admitting: Sports Medicine

## 2021-06-05 VITALS — BP 113/61 | HR 89 | Temp 98.0°F | Resp 18

## 2021-06-05 DIAGNOSIS — N760 Acute vaginitis: Secondary | ICD-10-CM | POA: Diagnosis not present

## 2021-06-05 DIAGNOSIS — B9689 Other specified bacterial agents as the cause of diseases classified elsewhere: Secondary | ICD-10-CM

## 2021-06-05 DIAGNOSIS — Z113 Encounter for screening for infections with a predominantly sexual mode of transmission: Secondary | ICD-10-CM

## 2021-06-05 LAB — POC URINE PREG, ED: Preg Test, Ur: NEGATIVE

## 2021-06-05 MED ORDER — METRONIDAZOLE 500 MG PO TABS: 500.0000 mg | ORAL_TABLET | Freq: Two times a day (BID) | ORAL | 0 refills | Status: DC

## 2021-06-05 NOTE — Discharge Instructions (Addendum)
Flagyl 500mg  twice a day for 7 days with food (this should clear the BV)  If your BV returns negative, you can stop taking the medication  Will call with your STD results

## 2021-06-05 NOTE — ED Provider Notes (Signed)
[facility]    CSN: [id number] Arrival date & time: 06/05/21  1844      History   Chief Complaint Chief Complaint  Patient presents with   APPOINTMENT: STD Testing     HPI Beverly Marquez is a 30 y.o. female who presents today desiring STD testing.  HPI  Patient presents today with desire of testing for STDs.  She was tested on 05/10/2021 and was negative, however she states she is still with 1 female partner, however she does not trust him.  She did test positive for bacterial vaginitis at her last visit, and she was prescribed a gel, however states that this never did improve her symptoms and she never finished the entire course.  She is still having ongoing white/gray thin discharge that is malodorous.  She denies any pelvic pain or bleeding.  She denies any lesions or rashes in that area.  She denies any fever or chills or signs of systemic illness.  No abdominal pain or nausea.  She does not believe she is pregnant, she does take birth control but only on occasion when she is planning to have sexual intercourse.  No dysuria.  Past Medical History:  Diagnosis Date   No pertinent past medical history    Trichomonas     Patient Active Problem List   Diagnosis Date Noted   Supervision of other normal pregnancy, antepartum 11/09/2020   Contraception 09/12/2016    Past Surgical History:  Procedure Laterality Date   NO PAST SURGERIES      OB History     Gravida  7   Para  3   Term  3   Preterm  0   AB  4   Living  3      SAB  1   IAB  2   Ectopic  0   Multiple      Live Births  3            Home Medications    Prior to Admission medications   Medication Sig Start Date End Date Taking? Authorizing Provider  metroNIDAZOLE (FLAGYL) 500 MG tablet Take 1 tablet (500 mg total) by mouth 2 (two) times daily. 06/05/21  Yes Tamisha Nordstrom, Annabelle Harman, DO  BALCOLTRA 0.1-20 MG-MCG(21) TABS TAKE 1 TABLET BY MOUTH DAILY 02/28/21   Brock Bad, MD   fluticasone [facility]) 50 MCG/ACT nasal spray Place 1 spray into both nostrils 2 (two) times daily. Spray upward and out toward outer corners of eyes 04/15/21   Particia Nearing, PA-C  Levonorgest-Eth Estrad-Fe Bisg (BALCOLTRA PO) Take by mouth.    [provider]  mupirocin ointment (BACTROBAN) 2 % Apply 1 application topically 2 (two) times daily. 04/15/21   Particia Nearing, PA-C    Family History Family History  Problem Relation Age of Onset   Hypertension Mother    Healthy Father     Social History Social History   Tobacco Use   Smoking status: Never   Smokeless tobacco: Never  Vaping Use   Vaping Use: Never used  Substance Use Topics   Alcohol use: Yes   Drug use: Yes    Types: Marijuana     Allergies   Patient has no known allergies.   Review of Systems Review of Systems  Constitutional:  Negative for activity change, chills and fever.  Gastrointestinal:  Negative for abdominal pain.  Genitourinary:  Positive for vaginal discharge (+ malodorous). Negative for dysuria and pelvic pain.  Skin:  Negative for rash.    Physical Exam Triage Vital Signs ED Triage Vitals  Enc Vitals Group     BP 06/05/21 1904 113/61     Pulse Rate 06/05/21 1904 89     Resp 06/05/21 1904 18     Temp 06/05/21 1904 98 F (36.7 C)     Temp Source 06/05/21 1904 Oral     SpO2 06/05/21 1904 99 %     Weight --      Height --      Head Circumference --      Peak Flow --      Pain Score 06/05/21 1906 0     Pain Loc --      Pain Edu? --      Excl. in GC? --    No data found.  Updated Vital Signs BP 113/61 (BP Location: Right Arm)   Pulse 89   Temp 98 F (36.7 C) (Oral)   Resp 18   SpO2 99%   Physical Exam Gen: Well-appearing, in no acute distress; non-toxic CV: Well-perfused. Warm.  Resp: Breathing unlabored on room air; no wheezing. Psych: Fluid speech in conversation; appropriate affect; normal thought process GU: no lower abd TTP   UC Treatments /  Results  Labs (all labs ordered are listed, but only abnormal results are displayed) Labs Reviewed  POC URINE PREG, ED  CERVICOVAGINAL ANCILLARY ONLY    Procedures Procedures (including critical care time)  Medications Ordered in UC Medications - No data to display  Initial Impression / Assessment and Plan / UC Course  I have reviewed the triage vital signs and the nursing notes.  Pertinent labs & imaging results that were available during my care of the patient were reviewed by me and considered in my medical decision making (see chart for details).     Vaginal discharge, hx of bacterial vaginosis w/o resolution Desires for STI/STD testing  Patient did test positive for bacterial vaginitis on 05/10/2021.  She was prescribed metronidazole gel, however states she did not complete this treatment course as her symptoms did not improve at all.  She is still having an white/gray discharge with malodor. UPT negative today. She desires oral therapy today, I will send a prescription for Flagyl 500 mg twice daily x7 days to be taken with food, we will retest for bacterial vaginosis to ensure this is still present.  If her test is negative, we will call to recommend stopping the Flagyl prescription.  The patient tested negative for STDs on 05/10/2021, however desires retesting today given unprotected sex with partner. We will send cervical vaginal testing for gonorrhea, chlamydia, and trichomonas.  We will call with results.  Encourage safe sex practices with barrier contraception use.    Final Clinical Impressions(s) / UC Diagnoses   Final diagnoses:  Bacterial vaginosis  Screen for STD (sexually transmitted disease)   Discharge Instructions   None    ED Prescriptions     Medication Sig Dispense Auth. Provider   metroNIDAZOLE (FLAGYL) 500 MG tablet Take 1 tablet (500 mg total) by mouth 2 (two) times daily. 14 tablet Madelyn Brunner, DO      PDMP not reviewed this encounter.   Madelyn Brunner, DO 06/05/21 1943

## 2021-06-05 NOTE — ED Triage Notes (Signed)
Pt presents for STD Testing with no known symptoms other than frequent urination.

## 2021-06-06 LAB — CERVICOVAGINAL ANCILLARY ONLY
Bacterial Vaginitis (gardnerella): POSITIVE — AB
Candida Glabrata: NEGATIVE
Candida Vaginitis: NEGATIVE
Chlamydia: NEGATIVE
Comment: NEGATIVE
Comment: NEGATIVE
Comment: NEGATIVE
Comment: NEGATIVE
Comment: NEGATIVE
Comment: NORMAL
Neisseria Gonorrhea: NEGATIVE
Trichomonas: NEGATIVE

## 2021-06-23 ENCOUNTER — Ambulatory Visit (HOSPITAL_COMMUNITY): Payer: Medicaid Other

## 2021-06-25 ENCOUNTER — Encounter (HOSPITAL_COMMUNITY): Payer: Self-pay

## 2021-06-25 ENCOUNTER — Ambulatory Visit (HOSPITAL_COMMUNITY)
Admission: RE | Admit: 2021-06-25 | Discharge: 2021-06-25 | Disposition: A | Discharge status: Home / Self Care | Payer: Medicaid Other | Source: Ambulatory Visit | Attending: Student | Admitting: Student

## 2021-06-25 ENCOUNTER — Other Ambulatory Visit: Payer: Self-pay

## 2021-06-25 VITALS — BP 104/68 | HR 77 | Temp 98.6°F | Resp 16

## 2021-06-25 DIAGNOSIS — B009 Herpesviral infection, unspecified: Secondary | ICD-10-CM | POA: Diagnosis not present

## 2021-06-25 DIAGNOSIS — Z3202 Encounter for pregnancy test, result negative: Secondary | ICD-10-CM | POA: Diagnosis not present

## 2021-06-25 HISTORY — DX: Herpesviral infection, unspecified: B00.9

## 2021-06-25 LAB — POC URINE PREG, ED: Preg Test, Ur: NEGATIVE

## 2021-06-25 MED ORDER — VALACYCLOVIR HCL 500 MG PO TABS: 500.0000 mg | ORAL_TABLET | Freq: Two times a day (BID) | ORAL | 1 refills | Status: AC

## 2021-06-25 NOTE — ED Triage Notes (Signed)
Pt reports needing med refill of valacyclovir; denies current outbreak of herpes. Also wishes to be checked for "yeast, BV, and STDs".  Denies vaginal discharge or any other sxs.

## 2021-06-25 NOTE — ED Provider Notes (Signed)
[facility]    CSN: [id number] Arrival date & time: 06/25/21  1448      History   Chief Complaint Chief Complaint  Patient presents with   Appointment    1500   STD Check   Medication Refill    HPI Henny Kathie Rhodes Nickelson is a 30 y.o. female presenting for refill on her Valtrex, and also STI screen.  This patient was last positive for BV 06/05/2021, treated appropriately with Flagyl.  Also with history of trichomonas, genital herpes.  Today she is without symptoms, denies new partners or STI risk. Denies hematuria, dysuria, frequency, urgency, back pain, n/v/d/abd pain, fevers/chills, abdnormal vaginal discharge, genital rashes/lesions.  HPI  Past Medical History:  Diagnosis Date   Herpes    Trichomonas     Patient Active Problem List   Diagnosis Date Noted   Supervision of other normal pregnancy, antepartum 11/09/2020   Contraception 09/12/2016    Past Surgical History:  Procedure Laterality Date   NO PAST SURGERIES      OB History     Gravida  7   Para  3   Term  3   Preterm  0   AB  4   Living  3      SAB  1   IAB  2   Ectopic  0   Multiple      Live Births  3            Home Medications    Prior to Admission medications   Medication Sig Start Date End Date Taking? Authorizing Provider  valACYclovir (VALTREX) 500 MG tablet Take 1 tablet (500 mg total) by mouth 2 (two) times daily for 3 days. 06/25/21 06/28/21 Yes Rhys Martini, PA-C  BALCOLTRA 0.1-20 MG-MCG(21) TABS TAKE 1 TABLET BY MOUTH DAILY 02/28/21   Brock Bad, MD  fluticasone [facility]) 50 MCG/ACT nasal spray Place 1 spray into both nostrils 2 (two) times daily. Spray upward and out toward outer corners of eyes 04/15/21   Particia Nearing, PA-C  Levonorgest-Eth Estrad-Fe Bisg (BALCOLTRA PO) Take by mouth.    [provider]  metroNIDAZOLE (FLAGYL) 500 MG tablet Take 1 tablet (500 mg total) by mouth 2 (two) times daily. 06/05/21   Madelyn Brunner, DO   mupirocin ointment (BACTROBAN) 2 % Apply 1 application topically 2 (two) times daily. 04/15/21   Particia Nearing, PA-C    Family History Family History  Problem Relation Age of Onset   Hypertension Mother    Healthy Father     Social History Social History   Tobacco Use   Smoking status: Former    Types: Cigarettes   Smokeless tobacco: Never  Vaping Use   Vaping Use: Every day  Substance Use Topics   Alcohol use: Not Currently   Drug use: Yes    Types: Marijuana     Allergies   Patient has no known allergies.   Review of Systems Review of Systems  Constitutional:  Negative for chills and fever.  HENT:  Negative for sore throat.   Eyes:  Negative for pain and redness.  Respiratory:  Negative for shortness of breath.   Cardiovascular:  Negative for chest pain.  Gastrointestinal:  Negative for abdominal pain, diarrhea, nausea and vomiting.  Genitourinary:  Negative for decreased urine volume, difficulty urinating, dysuria, flank pain, frequency, genital sores, hematuria, menstrual problem, pelvic pain, urgency, vaginal bleeding, vaginal discharge and vaginal pain.  Musculoskeletal:  Negative for back pain.  Skin:  Negative for rash.  All other systems reviewed and are negative.   Physical Exam Triage Vital Signs ED Triage Vitals [06/25/21 1541]  Enc Vitals Group     BP 104/68     Pulse Rate 77     Resp 16     Temp 98.6 F (37 C)     Temp Source Oral     SpO2 96 %     Weight      Height      Head Circumference      Peak Flow      Pain Score 0     Pain Loc      Pain Edu?      Excl. in GC?    No data found.  Updated Vital Signs BP 104/68   Pulse 77   Temp 98.6 F (37 C) (Oral)   Resp 16   LMP 06/10/2021 (Exact Date)   SpO2 96%   Breastfeeding No   Visual Acuity Right Eye Distance:   Left Eye Distance:   Bilateral Distance:    Right Eye Near:   Left Eye Near:    Bilateral Near:     Physical Exam Vitals reviewed.  Constitutional:       General: She is not in acute distress.    Appearance: Normal appearance. She is not ill-appearing.  HENT:     Head: Normocephalic and atraumatic.     Mouth/Throat:     Mouth: Mucous membranes are moist.     Comments: Moist mucous membranes Eyes:     Extraocular Movements: Extraocular movements intact.     Pupils: Pupils are equal, round, and reactive to light.  Cardiovascular:     Rate and Rhythm: Normal rate and regular rhythm.     Heart sounds: Normal heart sounds.  Pulmonary:     Effort: Pulmonary effort is normal.     Breath sounds: Normal breath sounds. No wheezing, rhonchi or rales.  Abdominal:     General: Bowel sounds are normal. There is no distension.     Palpations: Abdomen is soft. There is no mass.     Tenderness: There is no abdominal tenderness. There is no right CVA tenderness, left CVA tenderness, guarding or rebound.  Skin:    General: Skin is warm.     Capillary Refill: Capillary refill takes less than 2 seconds.     Comments: Good skin turgor  Neurological:     General: No focal deficit present.     Mental Status: She is alert and oriented to person, place, and time.  Psychiatric:        Mood and Affect: Mood normal.        Behavior: Behavior normal.     UC Treatments / Results  Labs (all labs ordered are listed, but only abnormal results are displayed) Labs Reviewed  POC URINE PREG, ED  CERVICOVAGINAL ANCILLARY ONLY    EKG   Radiology No results found.  Procedures Procedures (including critical care time)  Medications Ordered in UC Medications - No data to display  Initial Impression / Assessment and Plan / UC Course  I have reviewed the triage vital signs and the nursing notes.  Pertinent labs & imaging results that were available during my care of the patient were reviewed by me and considered in my medical decision making (see chart for details).     This patient is a very pleasant 30 y.o. year old female presenting for Valtrex  refill- no current symptoms. Afebrile, nontachycardic,  no reproducible abd pain or CVAT.   Valtrex refilled.  Negative u-preg.  Positive for BV 05/2021, treated appropriately. Requesting STI screen, denies symptoms. Will send self-swab for G/C, trich, yeast, BV testing. Declines HIV, RPR. Safe sex precautions.   Valtrex sent.  ED return precautions discussed. Patient verbalizes understanding and agreement.   Coding Level 4 for review of past notes/labs, order and interpretation of labs today, and prescription drug management   Final Clinical Impressions(s) / UC Diagnoses   Final diagnoses:  HSV (herpes simplex virus) infection     Discharge Instructions      -Valtrex 2x daily x3 days for outbreaks of genital herpes. I provided 1 refill. -We have sent testing for sexually transmitted infections. We will notify you of any positive results once they are received. If required, we will prescribe any medications you might need in 2-3 days. Please refrain from all sexual activity until treatment is complete.  -Seek additional medical attention if you develop fevers/chills, new/worsening abdominal pain, new/worsening vaginal discomfort/discharge, etc.        ED Prescriptions     Medication Sig Dispense Auth. Provider   valACYclovir (VALTREX) 500 MG tablet Take 1 tablet (500 mg total) by mouth 2 (two) times daily for 3 days. 6 tablet Rhys Martini, PA-C      PDMP not reviewed this encounter.   Rhys Martini, PA-C 06/25/21 1642

## 2021-06-25 NOTE — Discharge Instructions (Addendum)
-  Valtrex 2x daily x3 days for outbreaks of genital herpes. I provided 1 refill. -We have sent testing for sexually transmitted infections. We will notify you of any positive results once they are received. If required, we will prescribe any medications you might need in 2-3 days. Please refrain from all sexual activity until treatment is complete.  -Seek additional medical attention if you develop fevers/chills, new/worsening abdominal pain, new/worsening vaginal discomfort/discharge, etc.

## 2021-06-26 LAB — CERVICOVAGINAL ANCILLARY ONLY
Bacterial Vaginitis (gardnerella): NEGATIVE
Candida Glabrata: NEGATIVE
Candida Vaginitis: NEGATIVE
Chlamydia: NEGATIVE
Comment: NEGATIVE
Comment: NEGATIVE
Comment: NEGATIVE
Comment: NEGATIVE
Comment: NEGATIVE
Comment: NORMAL
Neisseria Gonorrhea: NEGATIVE
Trichomonas: NEGATIVE

## 2021-07-26 ENCOUNTER — Ambulatory Visit (HOSPITAL_COMMUNITY): Payer: Self-pay

## 2021-07-27 ENCOUNTER — Other Ambulatory Visit: Payer: Self-pay

## 2021-07-27 ENCOUNTER — Ambulatory Visit (HOSPITAL_COMMUNITY)
Admission: RE | Admit: 2021-07-27 | Discharge: 2021-07-27 | Disposition: A | Discharge status: Home / Self Care | Payer: Medicaid Other | Source: Ambulatory Visit | Attending: Physician Assistant | Admitting: Physician Assistant

## 2021-07-27 ENCOUNTER — Encounter (HOSPITAL_COMMUNITY): Payer: Self-pay

## 2021-07-27 VITALS — BP 93/66 | HR 82 | Temp 98.1°F | Resp 18

## 2021-07-27 DIAGNOSIS — Z113 Encounter for screening for infections with a predominantly sexual mode of transmission: Secondary | ICD-10-CM | POA: Insufficient documentation

## 2021-07-27 NOTE — ED Provider Notes (Signed)
[facility]    CSN: [id number] Arrival date & time: 07/27/21  1349      History   Chief Complaint Chief Complaint  Patient presents with   SEXUALLY TRANSMITTED DISEASE    HPI Beverly Marquez is a 30 y.o. female.   Patient presents today for STI testing.  Reports that she has no symptoms and denies any vaginal discharge, abdominal pain, pelvic pain, nausea, vomiting, fever.  She is a new sexual partner and wants to be tested.  She is confident that she is not pregnant.  Denies any recent antibiotic use.  She is not interested in testing for HIV/hepatitis/syphilis.   Past Medical History:  Diagnosis Date   Herpes    Trichomonas     Patient Active Problem List   Diagnosis Date Noted   Supervision of other normal pregnancy, antepartum 11/09/2020   Contraception 09/12/2016    Past Surgical History:  Procedure Laterality Date   NO PAST SURGERIES      OB History     Gravida  7   Para  3   Term  3   Preterm  0   AB  4   Living  3      SAB  1   IAB  2   Ectopic  0   Multiple      Live Births  3            Home Medications    Prior to Admission medications   Medication Sig Start Date End Date Taking? Authorizing Provider  fluticasone (FLONASE) 50 MCG/ACT nasal spray Place 1 spray into both nostrils 2 (two) times daily. Spray upward and out toward outer corners of eyes 04/15/21   Particia Nearing, PA-C  mupirocin ointment (BACTROBAN) 2 % Apply 1 application topically 2 (two) times daily. 04/15/21   Particia Nearing, PA-C    Family History Family History  Problem Relation Age of Onset   Hypertension Mother    Healthy Father     Social History Social History   Tobacco Use   Smoking status: Former    Types: Cigarettes   Smokeless tobacco: Never  Vaping Use   Vaping Use: Every day  Substance Use Topics   Alcohol use: Not Currently   Drug use: Yes    Types: Marijuana     Allergies   Patient has no known  allergies.   Review of Systems Review of Systems  Constitutional:  Negative for activity change, appetite change, fatigue and fever.  Respiratory:  Negative for cough and shortness of breath.   Cardiovascular:  Negative for chest pain.  Gastrointestinal:  Negative for abdominal pain, diarrhea, nausea and vomiting.  Genitourinary:  Negative for dysuria, frequency, urgency, vaginal bleeding, vaginal discharge and vaginal pain.  Musculoskeletal:  Negative for arthralgias, back pain and myalgias.  Neurological:  Negative for dizziness, light-headedness and headaches.    Physical Exam Triage Vital Signs ED Triage Vitals  Enc Vitals Group     BP 07/27/21 1422 93/66     Pulse Rate 07/27/21 1422 82     Resp 07/27/21 1422 18     Temp 07/27/21 1422 98.1 F (36.7 C)     Temp src --      SpO2 07/27/21 1422 95 %     Weight --      Height --      Head Circumference --      Peak Flow --      Pain Score 07/27/21  1426 0     Pain Loc --      Pain Edu? --      Excl. in GC? --    No data found.  Updated Vital Signs BP 93/66   Pulse 82   Temp 98.1 F (36.7 C)   Resp 18   LMP 07/04/2021   SpO2 95%   Visual Acuity Right Eye Distance:   Left Eye Distance:   Bilateral Distance:    Right Eye Near:   Left Eye Near:    Bilateral Near:     Physical Exam Vitals reviewed.  Constitutional:      General: She is awake. She is not in acute distress.    Appearance: Normal appearance. She is well-developed. She is not ill-appearing.     Comments: Very pleasant female appears stated age no acute distress  HENT:     Head: Normocephalic and atraumatic.  Cardiovascular:     Rate and Rhythm: Normal rate and regular rhythm.     Heart sounds: Normal heart sounds, S1 normal and S2 normal. No murmur heard. Pulmonary:     Effort: Pulmonary effort is normal.     Breath sounds: Normal breath sounds. No wheezing, rhonchi or rales.     Comments: Clear to auscultation bilaterally Abdominal:      Palpations: Abdomen is soft.     Tenderness: There is no abdominal tenderness.  Psychiatric:        Behavior: Behavior is cooperative.     UC Treatments / Results  Labs (all labs ordered are listed, but only abnormal results are displayed) Labs Reviewed  CERVICOVAGINAL ANCILLARY ONLY    EKG   Radiology No results found.  Procedures Procedures (including critical care time)  Medications Ordered in UC Medications - No data to display  Initial Impression / Assessment and Plan / UC Course  I have reviewed the triage vital signs and the nursing notes.  Pertinent labs & imaging results that were available during my care of the patient were reviewed by me and considered in my medical decision making (see chart for details).      STI screening obtained today-feels better.  She is asymptomatic so will defer treatment until results are obtained.  Discussed that she should abstain from sex until results are obtained.  Strict return precautions given to which she expressed understanding.  Final Clinical Impressions(s) / UC Diagnoses   Final diagnoses:  Routine screening for STI (sexually transmitted infection)   Discharge Instructions   None    ED Prescriptions   None    PDMP not reviewed this encounter.   Jeani Hawking, PA-C 07/27/21 1445

## 2021-07-27 NOTE — ED Triage Notes (Signed)
Pt is present today for STD testing. Pt denies any sx  

## 2021-07-28 LAB — CERVICOVAGINAL ANCILLARY ONLY
Bacterial Vaginitis (gardnerella): NEGATIVE
Candida Glabrata: NEGATIVE
Candida Vaginitis: NEGATIVE
Chlamydia: NEGATIVE
Comment: NEGATIVE
Comment: NEGATIVE
Comment: NEGATIVE
Comment: NEGATIVE
Comment: NEGATIVE
Comment: NORMAL
Neisseria Gonorrhea: NEGATIVE
Trichomonas: NEGATIVE

## 2021-08-17 ENCOUNTER — Telehealth: Payer: Self-pay | Admitting: Obstetrics

## 2021-08-17 ENCOUNTER — Other Ambulatory Visit: Payer: Self-pay | Admitting: Obstetrics

## 2021-08-17 DIAGNOSIS — A6000 Herpesviral infection of urogenital system, unspecified: Secondary | ICD-10-CM

## 2021-08-17 MED ORDER — VALACYCLOVIR HCL 1 G PO TABS: 1000.0000 mg | ORAL_TABLET | Freq: Two times a day (BID) | ORAL | 4 refills | Status: DC

## 2021-08-17 MED ORDER — VALACYCLOVIR HCL 1 G PO TABS: 1000.0000 mg | ORAL_TABLET | Freq: Every day | ORAL | 11 refills | Status: DC

## 2021-08-17 NOTE — Telephone Encounter (Signed)
Patient called requesting daily suppression Rx for herpes.  Patient is currently having an outbreak.   Request routed to provider for approval.   Rx for outbreak dosage of Valtrex sent to pharmacy per protocol.

## 2021-09-08 ENCOUNTER — Ambulatory Visit (HOSPITAL_COMMUNITY): Payer: Self-pay

## 2021-09-19 ENCOUNTER — Other Ambulatory Visit: Payer: Self-pay

## 2021-09-19 ENCOUNTER — Ambulatory Visit
Admission: RE | Admit: 2021-09-19 | Discharge: 2021-09-19 | Disposition: A | Discharge status: Home / Self Care | Payer: Medicaid Other | Source: Ambulatory Visit | Attending: Physician Assistant | Admitting: Physician Assistant

## 2021-09-19 VITALS — BP 109/68 | HR 87 | Temp 98.2°F | Resp 18

## 2021-09-19 DIAGNOSIS — Z113 Encounter for screening for infections with a predominantly sexual mode of transmission: Secondary | ICD-10-CM | POA: Diagnosis not present

## 2021-09-19 NOTE — ED Triage Notes (Signed)
Pt requesting STD testing. Pt denies any sx's.

## 2021-09-19 NOTE — ED Provider Notes (Signed)
[facility]    CSN: [id number] Arrival date & time: 09/19/21  1643      History   Chief Complaint Chief Complaint  Patient presents with   SEXUALLY TRANSMITTED DISEASE   appt 5    HPI Beverly Marquez is a 30 y.o. female.   Patient here today for CT screening.  She denies any symptoms or exposure, just wants to be tested to be on the safe side.  The history is provided by the patient.   Past Medical History:  Diagnosis Date   Herpes    Trichomonas     Patient Active Problem List   Diagnosis Date Noted   Supervision of other normal pregnancy, antepartum 11/09/2020   Contraception 09/12/2016    Past Surgical History:  Procedure Laterality Date   NO PAST SURGERIES      OB History     Gravida  7   Para  3   Term  3   Preterm  0   AB  4   Living  3      SAB  1   IAB  2   Ectopic  0   Multiple      Live Births  3            Home Medications    Prior to Admission medications   Medication Sig Start Date End Date Taking? Authorizing Provider  fluticasone (FLONASE) 50 MCG/ACT nasal spray Place 1 spray into both nostrils 2 (two) times daily. Spray upward and out toward outer corners of eyes 04/15/21   Volney American, PA-C  mupirocin ointment (BACTROBAN) 2 % Apply 1 application topically 2 (two) times daily. 04/15/21   Volney American, PA-C  valACYclovir (VALTREX) 1000 MG tablet Take 1 tablet (1,000 mg total) by mouth daily. 08/17/21   Shelly Bombard, MD    Family History Family History  Problem Relation Age of Onset   Hypertension Mother    Healthy Father     Social History Social History   Tobacco Use   Smoking status: Former    Types: Cigarettes   Smokeless tobacco: Never  Vaping Use   Vaping Use: Every day  Substance Use Topics   Alcohol use: Not Currently   Drug use: Yes    Types: Marijuana     Allergies   Patient has no known allergies.   Review of Systems Review of Systems   Constitutional:  Negative for chills and fever.  Eyes:  Negative for discharge and redness.  Respiratory:  Negative for shortness of breath.   Gastrointestinal:  Negative for abdominal pain, nausea and vomiting.  Genitourinary:  Negative for dysuria and vaginal discharge.    Physical Exam Triage Vital Signs ED Triage Vitals [09/19/21 1715]  Enc Vitals Group     BP 109/68     Pulse Rate 87     Resp 18     Temp 98.2 F (36.8 C)     Temp Source Oral     SpO2 96 %     Weight      Height      Head Circumference      Peak Flow      Pain Score 0     Pain Loc      Pain Edu?      Excl. in [place]?    No data found.  Updated Vital Signs BP 109/68 (BP Location: Left Arm)   Pulse 87   Temp 98.2 F (36.8  C) (Oral)   Resp 18   LMP 09/15/2021   SpO2 96%   Physical Exam Vitals and nursing note reviewed.  Constitutional:      General: She is not in acute distress.    Appearance: Normal appearance. She is not ill-appearing.  HENT:     Head: Normocephalic and atraumatic.  Eyes:     Conjunctiva/sclera: Conjunctivae normal.  Cardiovascular:     Rate and Rhythm: Normal rate.  Pulmonary:     Effort: Pulmonary effort is normal.  Neurological:     Mental Status: She is alert.  Psychiatric:        Mood and Affect: Mood normal.        Behavior: Behavior normal.        Thought Content: Thought content normal.     UC Treatments / Results  Labs (all labs ordered are listed, but only abnormal results are displayed) Labs Reviewed - No data to display  EKG   Radiology No results found.  Procedures Procedures (including critical care time)  Medications Ordered in UC Medications - No data to display  Initial Impression / Assessment and Plan / UC Course  I have reviewed the triage vital signs and the nursing notes.  Pertinent labs & imaging results that were available during my care of the patient were reviewed by me and considered in my medical decision making (see chart for  details).    STD screening ordered as requested.,  Declines blood work.  Will await results for further recommendation.  Final diagnoses:  Screening for STD (sexually transmitted disease)   Discharge Instructions   None    ED Prescriptions   None    PDMP not reviewed this encounter.   Tomi Bamberger, PA-C 09/19/21 1740

## 2021-09-21 ENCOUNTER — Ambulatory Visit (HOSPITAL_COMMUNITY)
Admission: EM | Admit: 2021-09-21 | Discharge: 2021-09-21 | Disposition: A | Discharge status: Home / Self Care | Payer: Medicaid Other | Attending: Family Medicine | Admitting: Family Medicine

## 2021-09-21 ENCOUNTER — Telehealth (HOSPITAL_COMMUNITY): Payer: Self-pay | Admitting: Emergency Medicine

## 2021-09-21 ENCOUNTER — Other Ambulatory Visit: Payer: Self-pay

## 2021-09-21 DIAGNOSIS — A549 Gonococcal infection, unspecified: Secondary | ICD-10-CM | POA: Diagnosis not present

## 2021-09-21 LAB — CERVICOVAGINAL ANCILLARY ONLY
Bacterial Vaginitis (gardnerella): POSITIVE — AB
Candida Glabrata: NEGATIVE
Candida Vaginitis: NEGATIVE
Chlamydia: NEGATIVE
Comment: NEGATIVE
Comment: NEGATIVE
Comment: NEGATIVE
Comment: NEGATIVE
Comment: NEGATIVE
Comment: NORMAL
Neisseria Gonorrhea: POSITIVE — AB
Trichomonas: NEGATIVE

## 2021-09-21 MED ORDER — CEFTRIAXONE SODIUM 500 MG IJ SOLR
INTRAMUSCULAR | Status: AC
  Filled 2021-09-21: qty 500

## 2021-09-21 MED ORDER — CEFTRIAXONE SODIUM 500 MG IJ SOLR
500.0000 mg | Freq: Once | INTRAMUSCULAR | Status: AC
  Administered 2021-09-21: 500 mg via INTRAMUSCULAR

## 2021-09-21 MED ORDER — LIDOCAINE HCL (PF) 1 % IJ SOLN
INTRAMUSCULAR | Status: AC
  Filled 2021-09-21: qty 2

## 2021-09-21 NOTE — Telephone Encounter (Signed)
Per protocol, patient will need treatment with IM Rocephin 500mg  for positive Gonorrhea.  Will also need treatment with Metronidazole.  Attempted to reach patient x 1, VM is full [facility] notified

## 2021-09-21 NOTE — ED Triage Notes (Signed)
Pt presents for STD tx. Pt states she tested positive for Gonorrhea.

## 2021-09-22 ENCOUNTER — Ambulatory Visit (HOSPITAL_COMMUNITY): Payer: Self-pay

## 2021-09-25 ENCOUNTER — Ambulatory Visit (HOSPITAL_COMMUNITY)
Admission: RE | Admit: 2021-09-25 | Discharge: 2021-09-25 | Disposition: A | Discharge status: Home / Self Care | Payer: Medicaid Other | Source: Ambulatory Visit | Attending: Emergency Medicine | Admitting: Emergency Medicine

## 2021-09-25 ENCOUNTER — Other Ambulatory Visit: Payer: Self-pay

## 2021-09-25 ENCOUNTER — Telehealth (HOSPITAL_COMMUNITY): Payer: Self-pay | Admitting: Emergency Medicine

## 2021-09-25 ENCOUNTER — Encounter (HOSPITAL_COMMUNITY): Payer: Self-pay

## 2021-09-25 VITALS — BP 108/70 | HR 84 | Temp 98.2°F | Resp 19

## 2021-09-25 DIAGNOSIS — Z113 Encounter for screening for infections with a predominantly sexual mode of transmission: Secondary | ICD-10-CM | POA: Insufficient documentation

## 2021-09-25 DIAGNOSIS — Z202 Contact with and (suspected) exposure to infections with a predominantly sexual mode of transmission: Secondary | ICD-10-CM | POA: Diagnosis not present

## 2021-09-25 MED ORDER — METRONIDAZOLE 500 MG PO TABS: 500.0000 mg | ORAL_TABLET | Freq: Two times a day (BID) | ORAL | 0 refills | Status: DC

## 2021-09-25 NOTE — ED Triage Notes (Signed)
Pt presents for a re-check and re-test for STD.   States she wants to make sure she no longer has Gonorrhea.   States she is going to pick up her medication today to treat BV.

## 2021-09-25 NOTE — Discharge Instructions (Signed)
Repeat gonorrhea/chlamydia test sent out today. We will contact you with results. Make sure to complete the Flagyl that was sent into the pharmacy for the BV.

## 2021-09-25 NOTE — ED Provider Notes (Signed)
[facility]    CSN: [id number] Arrival date & time: 09/25/21  1702      History   Chief Complaint Chief Complaint  Patient presents with   Appointment    HPI Beverly Marquez is a 30 y.o. female.   Patient presents for STI follow-up. She was seen here on 12/6 for asymptomatic screening. The tests returned positive for gonorrhea and BV. She was treated with Rocephin on 12/8. Flagyl was sent into her pharmacy which she states she is going to pick up today. The patient returns requesting repeat testing to make sure the gonorrhea has been resolved. She denies any symptoms including vaginal discharge, dysuria, abdominal or back pain.   The history is provided by the patient.   Past Medical History:  Diagnosis Date   Herpes    Trichomonas     Patient Active Problem List   Diagnosis Date Noted   Supervision of other normal pregnancy, antepartum 11/09/2020   Contraception 09/12/2016    Past Surgical History:  Procedure Laterality Date   NO PAST SURGERIES      OB History     Gravida  7   Para  3   Term  3   Preterm  0   AB  4   Living  3      SAB  1   IAB  2   Ectopic  0   Multiple      Live Births  3            Home Medications    Prior to Admission medications   Medication Sig Start Date End Date Taking? Authorizing Provider  fluticasone (FLONASE) 50 MCG/ACT nasal spray Place 1 spray into both nostrils 2 (two) times daily. Spray upward and out toward outer corners of eyes 04/15/21   Particia Nearing, PA-C  metroNIDAZOLE (FLAGYL) 500 MG tablet Take 1 tablet (500 mg total) by mouth 2 (two) times daily. 09/25/21   LampteyBritta Mccreedy, MD  mupirocin ointment (BACTROBAN) 2 % Apply 1 application topically 2 (two) times daily. 04/15/21   Particia Nearing, PA-C  valACYclovir (VALTREX) 1000 MG tablet Take 1 tablet (1,000 mg total) by mouth daily. 08/17/21   Brock Bad, MD    Family History Family History  Problem Relation  Age of Onset   Hypertension Mother    Healthy Father     Social History Social History   Tobacco Use   Smoking status: Former    Types: Cigarettes   Smokeless tobacco: Never  Vaping Use   Vaping Use: Every day  Substance Use Topics   Alcohol use: Not Currently   Drug use: Yes    Types: Marijuana     Allergies   Patient has no known allergies.   Review of Systems Review of Systems  Constitutional:  Negative for fatigue and fever.  Gastrointestinal:  Negative for abdominal pain, nausea and vomiting.  Genitourinary:  Negative for difficulty urinating, dysuria, frequency, genital sores, hematuria, urgency, vaginal discharge and vaginal pain.  Musculoskeletal:  Negative for back pain.  Skin:  Negative for rash.    Physical Exam Triage Vital Signs ED Triage Vitals  Enc Vitals Group     BP 09/25/21 1738 108/70     Pulse Rate 09/25/21 1738 84     Resp 09/25/21 1738 19     Temp 09/25/21 1738 98.2 F (36.8 C)     Temp Source 09/25/21 1738 Oral     SpO2 09/25/21 1738  96 %     Weight --      Height --      Head Circumference --      Peak Flow --      Pain Score 09/25/21 1737 0     Pain Loc --      Pain Edu? --      Excl. in [place]? --    No data found.  Updated Vital Signs BP 108/70 (BP Location: Right Arm)   Pulse 84   Temp 98.2 F (36.8 C) (Oral)   Resp 19   LMP 09/15/2021 (Exact Date)   SpO2 96%   Visual Acuity Right Eye Distance:   Left Eye Distance:   Bilateral Distance:    Right Eye Near:   Left Eye Near:    Bilateral Near:     Physical Exam Vitals and nursing note reviewed.  Constitutional:      General: She is not in acute distress. Eyes:     Pupils: Pupils are equal, round, and reactive to light.  Cardiovascular:     Rate and Rhythm: Normal rate and regular rhythm.     Heart sounds: Normal heart sounds.  Pulmonary:     Effort: Pulmonary effort is normal.     Breath sounds: Normal breath sounds.  Abdominal:     Palpations: Abdomen is  soft.     Tenderness: There is no abdominal tenderness. There is no right CVA tenderness, left CVA tenderness or guarding.  Skin:    Findings: No rash.  Neurological:     Mental Status: She is alert.  Psychiatric:        Mood and Affect: Mood normal.     UC Treatments / Results  Labs (all labs ordered are listed, but only abnormal results are displayed) Labs Reviewed  CERVICOVAGINAL ANCILLARY ONLY    EKG   Radiology No results found.  Procedures Procedures (including critical care time)  Medications Ordered in UC Medications - No data to display  Initial Impression / Assessment and Plan / UC Course  I have reviewed the triage vital signs and the nursing notes.  Pertinent labs & imaging results that were available during my care of the patient were reviewed by me and considered in my medical decision making (see chart for details).     Repeat G/C testing sent out. Encouraged pt again to complete Flagyl for BV.   E/M: 1 acute uncomplicated illness, 1 data (G/C), low risk  Final Clinical Impressions(s) / UC Diagnoses   Final diagnoses:  Screen for sexually transmitted diseases     Discharge Instructions      Repeat gonorrhea/chlamydia test sent out today. We will contact you with results. Make sure to complete the Flagyl that was sent into the pharmacy for the [place].     ED Prescriptions   None    PDMP not reviewed this encounter.   Delsa Sale, [place] 09/25/21 1800

## 2021-09-28 LAB — CERVICOVAGINAL ANCILLARY ONLY
Chlamydia: NEGATIVE
Comment: NEGATIVE

## 2021-10-02 ENCOUNTER — Ambulatory Visit
Admission: RE | Admit: 2021-10-02 | Discharge status: Against Medical Advice | Payer: Medicaid Other | Source: Ambulatory Visit | Attending: Obstetrics | Admitting: Obstetrics

## 2021-10-10 ENCOUNTER — Ambulatory Visit: Payer: Self-pay

## 2021-10-12 ENCOUNTER — Ambulatory Visit (HOSPITAL_COMMUNITY): Payer: Self-pay

## 2021-10-14 ENCOUNTER — Ambulatory Visit: Payer: Self-pay

## 2021-10-14 ENCOUNTER — Ambulatory Visit (HOSPITAL_COMMUNITY): Payer: Self-pay

## 2021-10-23 ENCOUNTER — Ambulatory Visit (HOSPITAL_COMMUNITY): Payer: Self-pay

## 2021-12-27 ENCOUNTER — Other Ambulatory Visit (HOSPITAL_COMMUNITY)
Admission: RE | Admit: 2021-12-27 | Discharge: 2021-12-27 | Disposition: A | Discharge status: Home / Self Care | Payer: Medicaid Other | Source: Ambulatory Visit | Attending: Obstetrics | Admitting: Obstetrics

## 2021-12-27 ENCOUNTER — Ambulatory Visit (INDEPENDENT_AMBULATORY_CARE_PROVIDER_SITE_OTHER): Payer: Medicaid Other | Admitting: Obstetrics

## 2021-12-27 ENCOUNTER — Encounter: Payer: Self-pay | Admitting: Obstetrics

## 2021-12-27 ENCOUNTER — Other Ambulatory Visit: Payer: Self-pay

## 2021-12-27 VITALS — BP 112/71 | HR 76 | Ht 65.0 in | Wt 152.6 lb

## 2021-12-27 DIAGNOSIS — N898 Other specified noninflammatory disorders of vagina: Secondary | ICD-10-CM

## 2021-12-27 DIAGNOSIS — R87612 Low grade squamous intraepithelial lesion on cytologic smear of cervix (LGSIL): Secondary | ICD-10-CM

## 2021-12-27 DIAGNOSIS — B851 Pediculosis due to Pediculus humanus corporis: Secondary | ICD-10-CM | POA: Diagnosis present

## 2021-12-27 DIAGNOSIS — Z01419 Encounter for gynecological examination (general) (routine) without abnormal findings: Secondary | ICD-10-CM

## 2021-12-27 DIAGNOSIS — Z1501 Genetic susceptibility to malignant neoplasm of breast: Secondary | ICD-10-CM

## 2021-12-27 DIAGNOSIS — Z3009 Encounter for other general counseling and advice on contraception: Secondary | ICD-10-CM

## 2021-12-27 NOTE — Progress Notes (Signed)
GYN/ Annual ?Declines STI testing ?Wants referral to "breast specialist", concerned for breast cancer screening: mother has "aggressive" form of breast cancer at 60, lumpectomy and chemo. ?

## 2021-12-27 NOTE — Progress Notes (Signed)
? ?Subjective: ? ? ?  ?  ? Beverly Marquez is a 31 y.o. female here for a routine exam.  Current complaints: Vaginal discharge.  Mother recently diagnosed with Breast CA, and she would like to be tested for genetic susceptibility. ? ?Personal health questionnaire:  ?Is patient Ashkenazi Jewish, have a family history of breast and/or ovarian cancer: yes ?Is there a family history of uterine cancer diagnosed at age < 52, gastrointestinal cancer, urinary tract cancer, family member who is a Personnel officer syndrome-associated carrier: no ?Is the patient overweight and hypertensive, family history of diabetes, personal history of gestational diabetes, preeclampsia or PCOS: no ?Is patient over 70, have PCOS,  family history of premature CHD under age 55, diabetes, smoke, have hypertension or peripheral artery disease:  no ?At any time, has a partner hit, kicked or otherwise hurt or frightened you?: no ?Over the past 2 weeks, have you felt down, depressed or hopeless?: no ?Over the past 2 weeks, have you felt little interest or pleasure in doing things?:no ? ? ?Gynecologic History ?Patient's last menstrual period was 12/20/2021. ?Contraception: condoms ?Last Pap: 09-06-2020. Results were: LGSIL ?Last mammogram: n/a. Results were: n/a ? ?Obstetric History ?OB History  ?Gravida Para Term Preterm AB Living  ?7 3 3  0 4 3  ?SAB IAB Ectopic Multiple Live Births  ?1 2 0   3  ?  ?# Outcome Date GA Lbr Len/2nd Weight Sex Delivery Anes PTL Lv  ?7 AB 10/29/20 [redacted]w[redacted]d         ?6 Term 2017     Vag-Spont   LIV  ?5 Term 11/15/11 [redacted]w[redacted]d 10:45 / 00:11 6 lb 14 oz (3.118 kg) F Vag-Spont EPI  LIV  ?4 Term 06/30/10    F Vag-Spont EPI N LIV  ?3 IAB           ?2 IAB           ?1 SAB           ? ? ?Past Medical History:  ?Diagnosis Date  ? Herpes   ? Trichomonas   ?  ?Past Surgical History:  ?Procedure Laterality Date  ? NO PAST SURGERIES    ?  ? ?Current Outpatient Medications:  ?  fluticasone (FLONASE) 50 MCG/ACT nasal spray, Place 1 spray into both  nostrils 2 (two) times daily. Spray upward and out toward outer corners of eyes (Patient not taking: Reported on 12/27/2021), Disp: 16 g, Rfl: 2 ?  metroNIDAZOLE (FLAGYL) 500 MG tablet, Take 1 tablet (500 mg total) by mouth 2 (two) times daily., Disp: 14 tablet, Rfl: 0 ?  mupirocin ointment (BACTROBAN) 2 %, Apply 1 application topically 2 (two) times daily., Disp: 22 g, Rfl: 0 ?  valACYclovir (VALTREX) 1000 MG tablet, Take 1 tablet (1,000 mg total) by mouth daily., Disp: 30 tablet, Rfl: 11 ?No Known Allergies  ?Social History  ? ?Tobacco Use  ? Smoking status: Former  ?  Types: Cigarettes  ? Smokeless tobacco: Never  ?Substance Use Topics  ? Alcohol use: Not Currently  ?  ?Family History  ?Problem Relation Age of Onset  ? Healthy Father   ? Hypertension Mother   ? Breast cancer Mother   ?  ? ? ?Review of Systems ? ?Constitutional: negative for fatigue and weight loss ?Respiratory: negative for cough and wheezing ?Cardiovascular: negative for chest pain, fatigue and palpitations ?Gastrointestinal: negative for abdominal pain and change in bowel habits ?Musculoskeletal:negative for myalgias ?Neurological: negative for gait problems and tremors ?Behavioral/Psych: negative  for abusive relationship, depression ?Endocrine: negative for temperature intolerance    ?Genitourinary: positive for vaginal discharge.  negative for abnormal menstrual periods, genital lesions, hot flashes, sexual problems  ?Integument/breast: negative for breast lump, breast tenderness, nipple discharge and skin lesion(s) ? ?  ?Objective:  ? ?    ?BP 112/71   Pulse 76   Ht 5\' 5"  (1.651 m)   Wt 152 lb 9.6 oz (69.2 kg)   LMP 12/20/2021   BMI 25.39 kg/m?  ?General:   Alert and no distress  ?Skin:   no rash or abnormalities  ?Lungs:   clear to auscultation bilaterally  ?Heart:   regular rate and rhythm, S1, S2 normal, no murmur, click, rub or gallop  ?Breasts:   normal without suspicious masses, skin or nipple changes or axillary nodes  ?Abdomen:   normal findings: no organomegaly, soft, non-tender and no hernia  ?Pelvis:  External genitalia: normal general appearance ?Urinary system: urethral meatus normal and bladder without fullness, nontender ?Vaginal: normal without tenderness, induration or masses ?Cervix: normal appearance ?Adnexa: normal bimanual exam ?Uterus: anteverted and non-tender, normal size  ? ?Lab Review ?Urine pregnancy test ?Labs reviewed yes ?Radiologic studies reviewed no ? ?I have spent a total of 25 minutes of face-to-face and non-face-to-face time, excluding clinical staff time, reviewing notes and preparing to see patient, ordering tests and/or medications, and counseling the patient.  ? ? ?Assessment:  ? ? 1. Encounter for gynecological examination with Papanicolaou smear of cervix ?Rx: ?- Cytology - PAP ? ?2. Vaginal discharge ?Rx: ?- Cervicovaginal ancillary only( [place]) ? ?3. Breast cancer genetic susceptibility ?Rx: ?- MM DIGITAL SCREENING BILATERAL; Future ?- BRCAssure Comprehensive Panel ? ?4. Low grade squamous intraepith lesion on cytologic smear cervix (lgsil) ?- colposcopy done and biopsies were LGSIL ?- repeat pap in 1 year ? ?5. Encounter for other general counseling and advice on contraception ?- declines hormonal contraception ?- wants to use condoms  ?  ?Plan:  ? ? Education reviewed: calcium supplements, depression evaluation, low fat, low cholesterol diet, safe sex/STD prevention, self breast exams, and weight bearing exercise. ?Contraception: condoms. ?Mammogram ordered. ?Follow up in: 1 year.  ? ? ?Orders Placed This Encounter  ?Procedures  ? MM DIGITAL SCREENING BILATERAL  ?  Standing Status:   Future  ?  Standing Expiration Date:   12/28/2022  ?  Order Specific Question:   Reason for Exam (SYMPTOM  OR DIAGNOSIS REQUIRED)  ?  Answer:   Genetic susceptibility  ?  Order Specific Question:   Is the patient pregnant?  ?  Answer:   No  ?  Order Specific Question:   Preferred imaging location?  ?  Answer:    [facility]  ? BRCAssure Comprehensive Panel  ? ? ? ? 12/30/2022, MD ?12/27/2021 4:17 PM  ?

## 2021-12-28 LAB — CERVICOVAGINAL ANCILLARY ONLY
Bacterial Vaginitis (gardnerella): NEGATIVE
Candida Glabrata: NEGATIVE
Candida Vaginitis: NEGATIVE
Chlamydia: NEGATIVE
Comment: NEGATIVE
Comment: NEGATIVE
Comment: NEGATIVE
Comment: NEGATIVE
Comment: NEGATIVE
Comment: NORMAL
Neisseria Gonorrhea: NEGATIVE
Trichomonas: NEGATIVE

## 2022-01-01 ENCOUNTER — Other Ambulatory Visit: Payer: Self-pay

## 2022-01-01 ENCOUNTER — Encounter (HOSPITAL_COMMUNITY): Payer: Self-pay

## 2022-01-01 ENCOUNTER — Ambulatory Visit (HOSPITAL_COMMUNITY)
Admission: RE | Admit: 2022-01-01 | Discharge: 2022-01-01 | Disposition: A | Discharge status: Home / Self Care | Payer: Medicaid Other | Source: Ambulatory Visit | Attending: Family Medicine | Admitting: Family Medicine

## 2022-01-01 VITALS — BP 101/65 | HR 85 | Temp 98.4°F | Resp 20

## 2022-01-01 DIAGNOSIS — N760 Acute vaginitis: Secondary | ICD-10-CM | POA: Insufficient documentation

## 2022-01-01 LAB — CYTOLOGY - PAP
Comment: NEGATIVE
Diagnosis: NEGATIVE
High risk HPV: NEGATIVE

## 2022-01-01 MED ORDER — METRONIDAZOLE 500 MG PO TABS: 500.0000 mg | ORAL_TABLET | Freq: Two times a day (BID) | ORAL | 0 refills | Status: DC

## 2022-01-01 NOTE — Discharge Instructions (Addendum)
Staff will call you if any swab test is positive ? ?Take metronidazole 500 mg 1 tab twice daily for 7 days.  Do not drink alcohol within 72 hours of taking metronidazole ?

## 2022-01-01 NOTE — ED Triage Notes (Signed)
Patient states she has a BV infection, noticed symptoms last night.  Patient reports she noticed an odor and this is characteristic of when she has BV ?

## 2022-01-01 NOTE — ED Provider Notes (Signed)
?[facility] ? ? ? ?CSN: [id number] ?Arrival date & time: 01/01/22  1745 ? ? ?  ? ?History   ?Chief Complaint ?Chief Complaint  ?Patient presents with  ? SEXUALLY TRANSMITTED DISEASE  ? Appointment  ?  6:00  ? ? ?HPI ?Beverly Marquez is a 31 y.o. female.  ? ?HPI ?Here for symptoms that might be bacterial vaginosis.  She notes a vaginal smell.  It began last night. ? ?No fever or abdominal pain or dysuria.  She would like also to be screened for STIs. ? ?Past Medical History:  ?Diagnosis Date  ? Herpes   ? Trichomonas   ? ? ?Patient Active Problem List  ? Diagnosis Date Noted  ? Supervision of other normal pregnancy, antepartum 11/09/2020  ? Contraception 09/12/2016  ? ? ?Past Surgical History:  ?Procedure Laterality Date  ? NO PAST SURGERIES    ? ? ?OB History   ? ? Gravida  ?7  ? Para  ?3  ? Term  ?3  ? Preterm  ?0  ? AB  ?4  ? Living  ?3  ?  ? ? SAB  ?1  ? IAB  ?2  ? Ectopic  ?0  ? Multiple  ?   ? Live Births  ?3  ?   ?  ?  ? ? ? ?Home Medications   ? ?Prior to Admission medications   ?Medication Sig Start Date End Date Taking? Authorizing Provider  ?metroNIDAZOLE (FLAGYL) 500 MG tablet Take 1 tablet (500 mg total) by mouth 2 (two) times daily. 01/01/22   Zenia Resides, MD  ? ? ?Family History ?Family History  ?Problem Relation Age of Onset  ? Healthy Father   ? Hypertension Mother   ? Breast cancer Mother   ? ? ?Social History ?Social History  ? ?Tobacco Use  ? Smoking status: Former  ?  Types: Cigarettes  ? Smokeless tobacco: Never  ?Vaping Use  ? Vaping Use: Every day  ?Substance Use Topics  ? Alcohol use: Not Currently  ? Drug use: Yes  ?  Types: Marijuana  ? ? ? ?Allergies   ?Patient has no known allergies. ? ? ?Review of Systems ?Review of Systems ? ? ?Physical Exam ?Triage Vital Signs ?ED Triage Vitals  ?Enc Vitals Group  ?   BP 01/01/22 1851 101/65  ?   Pulse Rate 01/01/22 1851 85  ?   Resp 01/01/22 1851 20  ?   Temp 01/01/22 1851 98.4 ?F (36.9 ?C)  ?   Temp Source 01/01/22 1851 Oral  ?    SpO2 01/01/22 1851 100 %  ?   Weight --   ?   Height --   ?   Head Circumference --   ?   Peak Flow --   ?   Pain Score 01/01/22 1849 0  ?   Pain Loc --   ?   Pain Edu? --   ?   Excl. in [place]? --   ? ?No data found. ? ?Updated Vital Signs ?BP 101/65 (BP Location: Left Arm)   Pulse 85   Temp 98.4 ?F (36.9 ?C) (Oral)   Resp 20   LMP 12/20/2021   SpO2 100%  ? ?Visual Acuity ?Right Eye Distance:   ?Left Eye Distance:   ?Bilateral Distance:   ? ?Right Eye Near:   ?Left Eye Near:    ?Bilateral Near:    ? ?Physical Exam ?Vitals reviewed.  ?Constitutional:   ?   General: She  is not in acute distress. ?   Appearance: She is not toxic-appearing.  ?Cardiovascular:  ?   Rate and Rhythm: Normal rate and regular rhythm.  ?Pulmonary:  ?   Effort: Pulmonary effort is normal.  ?   Breath sounds: Normal breath sounds.  ?Neurological:  ?   Mental Status: She is oriented to person, place, and time.  ?Psychiatric:     ?   Behavior: Behavior normal.  ? ? ? ?UC Treatments / Results  ?Labs ?(all labs ordered are listed, but only abnormal results are displayed) ?Labs Reviewed  ?CERVICOVAGINAL ANCILLARY ONLY  ? ? ?EKG ? ? ?Radiology ?No results found. ? ?Procedures ?Procedures (including critical care time) ? ?Medications Ordered in UC ?Medications - No data to display ? ?Initial Impression / Assessment and Plan / UC Course  ?I have reviewed the triage vital signs and the nursing notes. ? ?Pertinent labs & imaging results that were available during my care of the patient were reviewed by me and considered in my medical decision making (see chart for details). ? ?  ? ?Will treat empirically for the BV.  We will treat per protocol anything else positive on the swab ?Final Clinical Impressions(s) / UC Diagnoses  ? ?Final diagnoses:  ?Vaginitis and vulvovaginitis  ? ? ? ?Discharge Instructions   ? ?  ?Staff will call you if any swab test is positive ? ?Take metronidazole 500 mg 1 tab twice daily for 7 days.  Do not drink alcohol within 72  hours of taking metronidazole ? ? ? ? ?ED Prescriptions   ? ? Medication Sig Dispense Auth. Provider  ? metroNIDAZOLE (FLAGYL) 500 MG tablet Take 1 tablet (500 mg total) by mouth 2 (two) times daily. 14 tablet Sherice Ijames, Janace Aris, MD  ? ?  ? ?PDMP not reviewed this encounter. ?  ?Zenia Resides, MD ?01/01/22 1859 ? ?

## 2022-01-02 ENCOUNTER — Telehealth (HOSPITAL_COMMUNITY): Payer: Self-pay | Admitting: *Deleted

## 2022-01-02 LAB — CERVICOVAGINAL ANCILLARY ONLY
Bacterial Vaginitis (gardnerella): POSITIVE — AB
Candida Glabrata: NEGATIVE
Candida Vaginitis: POSITIVE — AB
Chlamydia: NEGATIVE
Comment: NEGATIVE
Comment: NEGATIVE
Comment: NEGATIVE
Comment: NEGATIVE
Comment: NEGATIVE
Comment: NORMAL
Neisseria Gonorrhea: NEGATIVE
Trichomonas: NEGATIVE

## 2022-01-02 MED ORDER — FLUCONAZOLE 150 MG PO TABS: 150.0000 mg | ORAL_TABLET | Freq: Every day | ORAL | 0 refills | Status: DC

## 2022-01-08 LAB — BRCASSURE COMPREHENSIVE PANEL

## 2022-01-12 ENCOUNTER — Telehealth: Payer: Self-pay

## 2022-01-12 NOTE — Telephone Encounter (Signed)
S/w pt and advised that insurance is wanting more information about her mother's diagnosis to decide if they will cover BRCA test for pt. Advised pt to call insurance, pt agreed. ?

## 2022-01-25 ENCOUNTER — Inpatient Hospital Stay: Admission: RE | Admit: 2022-01-25 | Payer: Medicaid Other | Source: Ambulatory Visit

## 2022-02-02 ENCOUNTER — Ambulatory Visit (HOSPITAL_COMMUNITY): Payer: Self-pay

## 2022-02-03 ENCOUNTER — Ambulatory Visit (HOSPITAL_COMMUNITY)
Admission: RE | Admit: 2022-02-03 | Discharge: 2022-02-03 | Disposition: A | Discharge status: Home / Self Care | Payer: Medicaid Other | Source: Ambulatory Visit | Attending: Nurse Practitioner | Admitting: Nurse Practitioner

## 2022-02-03 VITALS — BP 122/73 | HR 82 | Temp 99.3°F | Resp 18

## 2022-02-03 DIAGNOSIS — N898 Other specified noninflammatory disorders of vagina: Secondary | ICD-10-CM | POA: Insufficient documentation

## 2022-02-03 NOTE — ED Triage Notes (Signed)
Pt states she has yeast infection and would like to check for STI infection.  ?

## 2022-02-03 NOTE — ED Provider Notes (Signed)
?[facility] ? ? ? ?CSN: [id number] ?Arrival date & time: 02/03/22  1630 ? ? ?  ? ?History   ?Chief Complaint ?Chief Complaint  ?Patient presents with  ? SEXUALLY TRANSMITTED DISEASE  ?  Entered by patient  ? ? ?HPI ?Beverly Marquez is a 31 y.o. female.  ? ?Patient presents for STI screening.  Reports white vaginal discharge that started this morning and some vaginal itching.  She denies any dysuria, urinary frequency or urgency, blood in her urine, vaginal discharge.  She denies any sores or lesions on her genitalia.  Denies any swelling in her groin.  No fevers, nausea/vomiting.  She thinks she is get ready to start her menses.  She reports she has recently had sexual intercourse without protection and is requesting STI testing today. ? ? ?Past Medical History:  ?Diagnosis Date  ? Herpes   ? Trichomonas   ? ? ?Patient Active Problem List  ? Diagnosis Date Noted  ? Supervision of other normal pregnancy, antepartum 11/09/2020  ? Contraception 09/12/2016  ? ? ?Past Surgical History:  ?Procedure Laterality Date  ? NO PAST SURGERIES    ? ? ?OB History   ? ? Gravida  ?7  ? Para  ?3  ? Term  ?3  ? Preterm  ?0  ? AB  ?4  ? Living  ?3  ?  ? ? SAB  ?1  ? IAB  ?2  ? Ectopic  ?0  ? Multiple  ?   ? Live Births  ?3  ?   ?  ?  ? ? ? ?Home Medications   ? ?Prior to Admission medications   ?Medication Sig Start Date End Date Taking? Authorizing Provider  ?fluconazole (DIFLUCAN) 150 MG tablet Take 1 tablet (150 mg total) by mouth daily. 01/02/22   LampteyBritta Mccreedy, MD  ?metroNIDAZOLE (FLAGYL) 500 MG tablet Take 1 tablet (500 mg total) by mouth 2 (two) times daily. 01/01/22   Zenia Resides, MD  ? ? ?Family History ?Family History  ?Problem Relation Age of Onset  ? Healthy Father   ? Hypertension Mother   ? Breast cancer Mother   ? ? ?Social History ?Social History  ? ?Tobacco Use  ? Smoking status: Former  ?  Types: Cigarettes  ? Smokeless tobacco: Never  ?Vaping Use  ? Vaping Use: Every day  ?Substance Use Topics   ? Alcohol use: Not Currently  ? Drug use: Yes  ?  Types: Marijuana  ? ? ? ?Allergies   ?Patient has no known allergies. ? ? ?Review of Systems ?Review of Systems ?Per HPI ? ?Physical Exam ?Triage Vital Signs ?ED Triage Vitals [02/03/22 1658]  ?Enc Vitals Group  ?   BP 122/73  ?   Pulse Rate 82  ?   Resp 18  ?   Temp 99.3 ?F (37.4 ?C)  ?   Temp Source Oral  ?   SpO2 100 %  ?   Weight   ?   Height   ?   Head Circumference   ?   Peak Flow   ?   Pain Score 0  ?   Pain Loc   ?   Pain Edu?   ?   Excl. in GC?   ? ?No data found. ? ?Updated Vital Signs ?BP 122/73 (BP Location: Left Arm)   Pulse 82   Temp 99.3 ?F (37.4 ?C) (Oral)   Resp 18   SpO2 100%  ? ?Visual Acuity ?Right  Eye Distance:   ?Left Eye Distance:   ?Bilateral Distance:   ? ?Right Eye Near:   ?Left Eye Near:    ?Bilateral Near:    ? ?Physical Exam ?Vitals and nursing note reviewed.  ?Constitutional:   ?   General: She is not in acute distress. ?   Appearance: Normal appearance. She is not toxic-appearing.  ?Genitourinary: ?   Comments: Deferred ?Skin: ?   General: Skin is warm and dry.  ?   Coloration: Skin is not jaundiced or pale.  ?   Findings: No erythema.  ?Neurological:  ?   Mental Status: She is alert and oriented to person, place, and time.  ?   Motor: No weakness.  ?   Gait: Gait normal.  ?Psychiatric:     ?   Mood and Affect: Mood normal.     ?   Behavior: Behavior is cooperative.  ? ? ? ?UC Treatments / Results  ?Labs ?(all labs ordered are listed, but only abnormal results are displayed) ?Labs Reviewed  ?CERVICOVAGINAL ANCILLARY ONLY  ? ? ?EKG ? ? ?Radiology ?No results found. ? ?Procedures ?Procedures (including critical care time) ? ?Medications Ordered in UC ?Medications - No data to display ? ?Initial Impression / Assessment and Plan / UC Course  ?I have reviewed the triage vital signs and the nursing notes. ? ?Pertinent labs & imaging results that were available during my care of the patient were reviewed by me and considered in my medical  decision making (see chart for details). ? ?  ?We will test self swab today for yeast vaginitis, bacterial vaginosis, trichomonas, gonorrhea, chlamydia.  Patient declines HIV and syphilis testing today.  Treat as indicated.  Encouraged condom use. ?Final Clinical Impressions(s) / UC Diagnoses  ? ?Final diagnoses:  ?Vaginal discharge  ? ? ? ?Discharge Instructions   ? ?  ?We will let you know with positive results and treat you as indicated ? ? ? ? ?ED Prescriptions   ?None ?  ? ?PDMP not reviewed this encounter. ?  ?Valentino Nose, NP ?02/03/22 1737 ? ?

## 2022-02-03 NOTE — Discharge Instructions (Signed)
We will let you know with positive results and treat you as indicated ?

## 2022-02-05 ENCOUNTER — Telehealth (HOSPITAL_COMMUNITY): Payer: Self-pay | Admitting: Emergency Medicine

## 2022-02-05 LAB — CERVICOVAGINAL ANCILLARY ONLY
Bacterial Vaginitis (gardnerella): POSITIVE — AB
Candida Glabrata: NEGATIVE
Candida Vaginitis: POSITIVE — AB
Chlamydia: NEGATIVE
Comment: NEGATIVE
Comment: NEGATIVE
Comment: NEGATIVE
Comment: NEGATIVE
Comment: NEGATIVE
Comment: NORMAL
Neisseria Gonorrhea: NEGATIVE
Trichomonas: NEGATIVE

## 2022-02-05 MED ORDER — METRONIDAZOLE 500 MG PO TABS: 500.0000 mg | ORAL_TABLET | Freq: Two times a day (BID) | ORAL | 0 refills | Status: DC

## 2022-02-05 MED ORDER — FLUCONAZOLE 150 MG PO TABS: 150.0000 mg | ORAL_TABLET | Freq: Once | ORAL | 0 refills | Status: AC

## 2022-03-09 ENCOUNTER — Ambulatory Visit (HOSPITAL_COMMUNITY)
Admission: RE | Admit: 2022-03-09 | Discharge: 2022-03-09 | Disposition: A | Discharge status: Home / Self Care | Payer: Medicaid Other | Source: Ambulatory Visit | Attending: Internal Medicine | Admitting: Internal Medicine

## 2022-03-09 ENCOUNTER — Encounter (HOSPITAL_COMMUNITY): Payer: Self-pay

## 2022-03-09 VITALS — BP 120/74 | HR 71 | Temp 98.4°F | Resp 19

## 2022-03-09 DIAGNOSIS — Z113 Encounter for screening for infections with a predominantly sexual mode of transmission: Secondary | ICD-10-CM | POA: Diagnosis not present

## 2022-03-09 DIAGNOSIS — R519 Headache, unspecified: Secondary | ICD-10-CM | POA: Diagnosis not present

## 2022-03-09 DIAGNOSIS — J301 Allergic rhinitis due to pollen: Secondary | ICD-10-CM | POA: Diagnosis not present

## 2022-03-09 LAB — HIV ANTIBODY (ROUTINE TESTING W REFLEX): HIV Screen 4th Generation wRfx: NONREACTIVE

## 2022-03-09 MED ORDER — FLUTICASONE PROPIONATE 50 MCG/ACT NA SUSP: 1.0000 | Freq: Every day | NASAL | 2 refills | Status: DC

## 2022-03-09 NOTE — Discharge Instructions (Addendum)
You were seen in urgent care today for STI testing.  Your testing will come back in the next 2 to 3 days.  You will get a phone call from [place] if any of your results are positive.  We will not call you if all of your testing is negative for STIs.  You will also get these results on MyChart.   I have prescribed Flonase nasal spray today for your seasonal allergies.  Please use 1 puff of nasal spray into each nostril each morning.  This will help to reduce inflammation in your nose and nasal congestion/drainage.  If you develop any new or worsening symptoms or do not improve in the next 2 to 3 days, please return.  If your symptoms are severe, please go to the emergency room.  Follow-up with your primary care provider for further evaluation and management of your symptoms as well as ongoing wellness visits.  I hope you feel better!

## 2022-03-09 NOTE — ED Triage Notes (Signed)
Pt presents with wanting std testing. Denies any symptoms or exposure.

## 2022-03-09 NOTE — ED Provider Notes (Signed)
[facility]    CSN: [id number] Arrival date & time: 03/09/22  1144      History   Chief Complaint Chief Complaint  Patient presents with   SEXUALLY TRANSMITTED DISEASE    I don't have one but I like to get checked on the regular - Entered by patient    HPI Beverly Marquez is a 31 y.o. female.   Patient presents to urgent care for STI screening.  Patient also states that while she was waiting for provider, she developed a headache.  Headache is a 8 on a scale of 0-10 and "behind her eyes".  She does not get headaches frequently and denies seasonal allergies.  Denies history of asthma.  Does report slight nasal drainage but does not take any medications for this.  Denies photosensitivity and sound sensitivity.  Patient would also like STI testing today.  She denies vaginal discharge, odor, and recent new sexual partners.  She states "it is just time for me to get tested again".  Denies urinary frequency, urgency, and dysuria.  No other complaints today.    Past Medical History:  Diagnosis Date   Herpes    Trichomonas     Patient Active Problem List   Diagnosis Date Noted   Supervision of other normal pregnancy, antepartum 11/09/2020   Contraception 09/12/2016    Past Surgical History:  Procedure Laterality Date   NO PAST SURGERIES      OB History     Gravida  7   Para  3   Term  3   Preterm  0   AB  4   Living  3      SAB  1   IAB  2   Ectopic  0   Multiple      Live Births  3            Home Medications    Prior to Admission medications   Medication Sig Start Date End Date Taking? Authorizing Provider  fluticasone (FLONASE) 50 MCG/ACT nasal spray Place 1 spray into both nostrils daily. 03/09/22  Yes Talbot Grumbling, FNP  metroNIDAZOLE (FLAGYL) 500 MG tablet Take 1 tablet (500 mg total) by mouth 2 (two) times daily. 02/05/22   Lamptey, Myrene Galas, MD    Family History Family History  Problem Relation Age of Onset    Healthy Father    Hypertension Mother    Breast cancer Mother     Social History Social History   Tobacco Use   Smoking status: Former    Types: Cigarettes   Smokeless tobacco: Never  Vaping Use   Vaping Use: Every day  Substance Use Topics   Alcohol use: Not Currently   Drug use: Yes    Types: Marijuana     Allergies   Patient has no known allergies.   Review of Systems Review of Systems Per HPI  Physical Exam Triage Vital Signs ED Triage Vitals  Enc Vitals Group     BP 03/09/22 1152 120/74     Pulse Rate 03/09/22 1152 71     Resp 03/09/22 1152 19     Temp 03/09/22 1152 98.4 F (36.9 C)     Temp src --      SpO2 03/09/22 1152 98 %     Weight --      Height --      Head Circumference --      Peak Flow --      Pain Score 03/09/22  1151 0     Pain Loc --      Pain Edu? --      Excl. in [place]? --    No data found.  Updated Vital Signs BP 120/74   Pulse 71   Temp 98.4 F (36.9 C)   Resp 19   LMP 03/05/2022   SpO2 98%   Visual Acuity Right Eye Distance:   Left Eye Distance:   Bilateral Distance:    Right Eye Near:   Left Eye Near:    Bilateral Near:     Physical Exam Vitals and nursing note reviewed.  Constitutional:      General: She is not in acute distress.    Appearance: She is well-developed.  HENT:     Head: Normocephalic and atraumatic.     Nose: Rhinorrhea present.     Right Turbinates: Swollen and pale.     Left Turbinates: Swollen and pale.     Right Sinus: No maxillary sinus tenderness or frontal sinus tenderness.     Left Sinus: No maxillary sinus tenderness or frontal sinus tenderness.     Mouth/Throat:     Pharynx: Posterior oropharyngeal erythema present.     Comments: Mild erythema to posterior oropharynx with small amount of clear postnasal drainage visualized. Airway intact and patent. Eyes:     Conjunctiva/sclera: Conjunctivae normal.  Cardiovascular:     Rate and Rhythm: Normal rate and regular rhythm.     Heart  sounds: No murmur heard. Pulmonary:     Effort: Pulmonary effort is normal. No respiratory distress.     Breath sounds: Normal breath sounds.  Abdominal:     Palpations: Abdomen is soft.     Tenderness: There is no abdominal tenderness.  Musculoskeletal:        General: No swelling.     Cervical back: Neck supple.  Skin:    General: Skin is warm and dry.     Capillary Refill: Capillary refill takes less than 2 seconds.  Neurological:     Mental Status: She is alert.  Psychiatric:        Mood and Affect: Mood normal.     UC Treatments / Results  Labs (all labs ordered are listed, but only abnormal results are displayed) Labs Reviewed  RPR  HIV ANTIBODY (ROUTINE TESTING W REFLEX)  CERVICOVAGINAL ANCILLARY ONLY    EKG   Radiology No results found.  Procedures Procedures (including critical care time)  Medications Ordered in UC Medications - No data to display  Initial Impression / Assessment and Plan / UC Course  I have reviewed the triage vital signs and the nursing notes.  Pertinent labs & imaging results that were available during my care of the patient were reviewed by me and considered in my medical decision making (see chart for details).  Patient presents to urgent care today for STI testing.  She performed self swab and testing is pending.  Patient would also like HIV and RPR testing today.  We will call her if any of her results are positive.  She also has access to MyChart to see her results.  Provided education regarding safe sexual intercourse and condom use to decrease the spread of STIs.  Signs of allergic rhinitis present to physical exam today.  Plan to prescribe Flonase nasal spray to be taken once daily 1 puff into each nostril for nasal congestion, drainage, and inflammation.  Suspect headache may be related to allergic rhinitis as well.  Offered Tylenol/ibuprofen in clinic  today, but patient states that [place] worked best for her and she  would prefer to take this medication at home.  Stable neuro exam today and vital signs.  Counseled patient regarding appropriate use of medications and potential side effects for all medications recommended or prescribed today. Discussed red flag signs and symptoms of worsening condition,when to call the PCP office, return to urgent care, and when to seek higher level of care. Patient verbalizes understanding and agreement with plan. All questions answered. Patient discharged in stable condition.  Final Clinical Impressions(s) / UC Diagnoses   Final diagnoses:  Acute nonintractable headache, unspecified headache type  Seasonal allergic rhinitis due to pollen  Screening examination for sexually transmitted disease     Discharge Instructions      You were seen in urgent care today for STI testing.  Your testing will come back in the next 2 to 3 days.  You will get a phone call from [place] if any of your results are positive.  We will not call you if all of your testing is negative for STIs.  You will also get these results on MyChart.   I have prescribed Flonase nasal spray today for your seasonal allergies.  Please use 1 puff of nasal spray into each nostril each morning.  This will help to reduce inflammation in your nose and nasal congestion/drainage.  If you develop any new or worsening symptoms or do not improve in the next 2 to 3 days, please return.  If your symptoms are severe, please go to the emergency room.  Follow-up with your primary care provider for further evaluation and management of your symptoms as well as ongoing wellness visits.  I hope you feel better!    ED Prescriptions     Medication Sig Dispense Auth. Provider   fluticasone (FLONASE) 50 MCG/ACT nasal spray Place 1 spray into both nostrils daily. 16 g Talbot Grumbling, FNP      PDMP not reviewed this encounter.   Talbot Grumbling, [place] 03/09/22 1252

## 2022-03-10 LAB — RPR: RPR Ser Ql: NONREACTIVE

## 2022-03-13 LAB — CERVICOVAGINAL ANCILLARY ONLY
Bacterial Vaginitis (gardnerella): POSITIVE — AB
Candida Glabrata: NEGATIVE
Candida Vaginitis: NEGATIVE
Chlamydia: NEGATIVE
Comment: NEGATIVE
Comment: NEGATIVE
Comment: NEGATIVE
Comment: NEGATIVE
Comment: NEGATIVE
Comment: NORMAL
Neisseria Gonorrhea: NEGATIVE
Trichomonas: NEGATIVE

## 2022-03-14 ENCOUNTER — Telehealth (HOSPITAL_COMMUNITY): Payer: Self-pay | Admitting: Emergency Medicine

## 2022-03-14 MED ORDER — METRONIDAZOLE 500 MG PO TABS: 500.0000 mg | ORAL_TABLET | Freq: Two times a day (BID) | ORAL | 0 refills | Status: DC

## 2022-04-17 ENCOUNTER — Encounter (HOSPITAL_COMMUNITY): Payer: Self-pay

## 2022-04-17 ENCOUNTER — Ambulatory Visit (HOSPITAL_COMMUNITY)
Admission: RE | Admit: 2022-04-17 | Discharge: 2022-04-17 | Disposition: A | Discharge status: Home / Self Care | Payer: Medicaid Other | Source: Ambulatory Visit | Attending: Physician Assistant | Admitting: Physician Assistant

## 2022-04-17 VITALS — BP 112/70 | HR 71 | Temp 98.7°F | Resp 17

## 2022-04-17 DIAGNOSIS — N760 Acute vaginitis: Secondary | ICD-10-CM

## 2022-04-17 DIAGNOSIS — B9689 Other specified bacterial agents as the cause of diseases classified elsewhere: Secondary | ICD-10-CM | POA: Diagnosis not present

## 2022-04-17 MED ORDER — METRONIDAZOLE 500 MG PO TABS: 500.0000 mg | ORAL_TABLET | Freq: Two times a day (BID) | ORAL | 0 refills | Status: DC

## 2022-04-17 NOTE — Discharge Instructions (Addendum)
Advised take the Flagyl 500 mg 1 twice daily until completed, avoid any products that contain alcohol.

## 2022-04-17 NOTE — ED Triage Notes (Signed)
Pt reports needing treatment for BV. Reports for a few days having vaginal discharge with fishy odor.

## 2022-04-17 NOTE — ED Provider Notes (Signed)
[facility]    CSN: [id number] Arrival date & time: 04/17/22  1020      History   Chief Complaint Chief Complaint  Patient presents with   Vaginal Discharge    HPI Beverly Marquez is a 31 y.o. female.   31 year old female presents with vaginal discharge patient relates that she has a history of having recurrent bacterial vaginosis.  Patient indicates that yesterday she started having light vaginal discharge with fishy odor.  Patient indicates that she is not having any vaginal itching, no indications of yeast infection, and is not having any urinary symptoms.  Patient relates she is not having any back pain, fever or chills.  Patient requests prescription for Flagyl to treat the infection.  Patient relates her last menses was June 12 and it was normal.   Vaginal Discharge   Past Medical History:  Diagnosis Date   Herpes    Trichomonas     Patient Active Problem List   Diagnosis Date Noted   Supervision of other normal pregnancy, antepartum 11/09/2020   Contraception 09/12/2016    Past Surgical History:  Procedure Laterality Date   NO PAST SURGERIES      OB History     Gravida  7   Para  3   Term  3   Preterm  0   AB  4   Living  3      SAB  1   IAB  2   Ectopic  0   Multiple      Live Births  3            Home Medications    Prior to Admission medications   Medication Sig Start Date End Date Taking? Authorizing Provider  fluticasone (FLONASE) 50 MCG/ACT nasal spray Place 1 spray into both nostrils daily. 03/09/22   Carlisle Beers, FNP  metroNIDAZOLE (FLAGYL) 500 MG tablet Take 1 tablet (500 mg total) by mouth 2 (two) times daily. 04/17/22   Ellsworth Lennox, PA-C    Family History Family History  Problem Relation Age of Onset   Healthy Father    Hypertension Mother    Breast cancer Mother     Social History Social History   Tobacco Use   Smoking status: Former    Types: Cigarettes   Smokeless tobacco: Never   Vaping Use   Vaping Use: Every day  Substance Use Topics   Alcohol use: Not Currently   Drug use: Yes    Types: Marijuana     Allergies   Patient has no known allergies.   Review of Systems Review of Systems  Genitourinary:  Positive for vaginal discharge (with fishy odor).     Physical Exam Triage Vital Signs ED Triage Vitals [04/17/22 1037]  Enc Vitals Group     BP 112/70     Pulse Rate 71     Resp 17     Temp 98.7 F (37.1 C)     Temp Source Oral     SpO2 97 %     Weight      Height      Head Circumference      Peak Flow      Pain Score 0     Pain Loc      Pain Edu?      Excl. in [place]?    No data found.  Updated Vital Signs BP 112/70 (BP Location: Left Arm)   Pulse 71   Temp 98.7 F (37.1  C) (Oral)   Resp 17   LMP 03/26/2022   SpO2 97%   Visual Acuity Right Eye Distance:   Left Eye Distance:   Bilateral Distance:    Right Eye Near:   Left Eye Near:    Bilateral Near:     Physical Exam Constitutional:      Appearance: Normal appearance.  Abdominal:     General: Abdomen is flat. Bowel sounds are normal.     Palpations: Abdomen is soft.     Tenderness: There is abdominal tenderness. There is no guarding or rebound.  Neurological:     Mental Status: She is alert.      UC Treatments / Results  Labs (all labs ordered are listed, but only abnormal results are displayed) Labs Reviewed  CERVICOVAGINAL ANCILLARY ONLY    EKG   Radiology No results found.  Procedures Procedures (including critical care time)  Medications Ordered in UC Medications - No data to display  Initial Impression / Assessment and Plan / UC Course  I have reviewed the triage vital signs and the nursing notes.  Pertinent labs & imaging results that were available during my care of the patient were reviewed by me and considered in my medical decision making (see chart for details).    Plan: 1.  Advised take the Flagyl 500 mg 1 twice daily until  completed. 2.  Advised to avoid any products with alcohol content as this may make the individual nauseated. 3.  GC/chlamydia/trichomonas/BV testing is pending. 4.  Advised to follow-up with PCP or return to urgent care if symptoms fail to improve. Final Clinical Impressions(s) / UC Diagnoses   Final diagnoses:  BV (bacterial vaginosis)     Discharge Instructions      Advised take the Flagyl 500 mg 1 twice daily until completed, avoid any products that contain alcohol.    ED Prescriptions     Medication Sig Dispense Auth. Provider   metroNIDAZOLE (FLAGYL) 500 MG tablet Take 1 tablet (500 mg total) by mouth 2 (two) times daily. 14 tablet Ellsworth Lennox, PA-C      PDMP not reviewed this encounter.   Ellsworth Lennox, PA-C 04/17/22 1054

## 2022-04-18 ENCOUNTER — Telehealth (HOSPITAL_COMMUNITY): Payer: Self-pay | Admitting: Emergency Medicine

## 2022-04-18 LAB — CERVICOVAGINAL ANCILLARY ONLY
Bacterial Vaginitis (gardnerella): POSITIVE — AB
Candida Glabrata: NEGATIVE
Candida Vaginitis: POSITIVE — AB
Chlamydia: NEGATIVE
Comment: NEGATIVE
Comment: NEGATIVE
Comment: NEGATIVE
Comment: NEGATIVE
Comment: NEGATIVE
Comment: NORMAL
Neisseria Gonorrhea: NEGATIVE
Trichomonas: NEGATIVE

## 2022-04-18 MED ORDER — FLUCONAZOLE 150 MG PO TABS: 150.0000 mg | ORAL_TABLET | Freq: Once | ORAL | 0 refills | Status: AC

## 2022-05-21 ENCOUNTER — Ambulatory Visit (HOSPITAL_COMMUNITY): Payer: Self-pay

## 2022-05-24 ENCOUNTER — Ambulatory Visit (HOSPITAL_COMMUNITY): Payer: Self-pay

## 2022-05-25 ENCOUNTER — Ambulatory Visit (HOSPITAL_COMMUNITY)
Admission: RE | Admit: 2022-05-25 | Discharge: 2022-05-25 | Disposition: A | Discharge status: Home / Self Care | Payer: Medicaid Other | Source: Ambulatory Visit | Attending: Emergency Medicine | Admitting: Emergency Medicine

## 2022-05-25 ENCOUNTER — Encounter (HOSPITAL_COMMUNITY): Payer: Self-pay

## 2022-05-25 VITALS — BP 108/71 | HR 76 | Temp 98.8°F | Resp 16

## 2022-05-25 DIAGNOSIS — B9689 Other specified bacterial agents as the cause of diseases classified elsewhere: Secondary | ICD-10-CM | POA: Diagnosis not present

## 2022-05-25 DIAGNOSIS — N898 Other specified noninflammatory disorders of vagina: Secondary | ICD-10-CM | POA: Diagnosis not present

## 2022-05-25 DIAGNOSIS — N760 Acute vaginitis: Secondary | ICD-10-CM | POA: Insufficient documentation

## 2022-05-25 DIAGNOSIS — Z113 Encounter for screening for infections with a predominantly sexual mode of transmission: Secondary | ICD-10-CM | POA: Diagnosis not present

## 2022-05-25 LAB — HIV ANTIBODY (ROUTINE TESTING W REFLEX): HIV Screen 4th Generation wRfx: NONREACTIVE

## 2022-05-25 MED ORDER — METRONIDAZOLE 500 MG PO TABS: 500.0000 mg | ORAL_TABLET | Freq: Two times a day (BID) | ORAL | 0 refills | Status: AC

## 2022-05-25 MED ORDER — FLUCONAZOLE 150 MG PO TABS: 150.0000 mg | ORAL_TABLET | Freq: Once | ORAL | 0 refills | Status: DC | PRN

## 2022-05-25 NOTE — ED Provider Notes (Signed)
[facility]    CSN: [id number] Arrival date & time: 05/25/22  1704      History   Chief Complaint Chief Complaint  Patient presents with   Vaginal Itching    Entered by patient    HPI Beverly Marquez is a 31 y.o. female.  Presents with 2-day history of vaginal discharge, itching, odor. Patient reports history of BV and knows this is BV again. She is requesting STD testing as well.  Denies any known exposures.  No abdominal pain, nausea, vomiting/diarrhea, vaginal bleeding or spotting.  Past Medical History:  Diagnosis Date   Herpes    Trichomonas     Patient Active Problem List   Diagnosis Date Noted   Supervision of other normal pregnancy, antepartum 11/09/2020   Contraception 09/12/2016    Past Surgical History:  Procedure Laterality Date   NO PAST SURGERIES      OB History     Gravida  7   Para  3   Term  3   Preterm  0   AB  4   Living  3      SAB  1   IAB  2   Ectopic  0   Multiple      Live Births  3            Home Medications    Prior to Admission medications   Medication Sig Start Date End Date Taking? Authorizing Provider  fluconazole (DIFLUCAN) 150 MG tablet Take 1 tablet (150 mg total) by mouth once as needed for up to 2 doses (take one pill on day 1, and the second pill 3 days later if symptoms do not improve). 05/25/22  Yes Shariece Viveiros, Lurena Joiner, PA-C  fluticasone (FLONASE) 50 MCG/ACT nasal spray Place 1 spray into both nostrils daily. 03/09/22   Carlisle Beers, FNP  metroNIDAZOLE (FLAGYL) 500 MG tablet Take 1 tablet (500 mg total) by mouth 2 (two) times daily for 7 days. 05/25/22 06/01/22  Myeesha Shane, Lurena Joiner PA-C    Family History Family History  Problem Relation Age of Onset   Healthy Father    Hypertension Mother    Breast cancer Mother     Social History Social History   Tobacco Use   Smoking status: Former    Types: Cigarettes   Smokeless tobacco: Never  Vaping Use   Vaping Use: Every day   Substance Use Topics   Alcohol use: Not Currently   Drug use: Yes    Types: Marijuana     Allergies   Patient has no known allergies.   Review of Systems Review of Systems Per HPI  Physical Exam Triage Vital Signs ED Triage Vitals [05/25/22 1742]  Enc Vitals Group     BP 108/71     Pulse Rate 76     Resp 16     Temp 98.8 F (37.1 C)     Temp Source Oral     SpO2 98 %     Weight      Height      Head Circumference      Peak Flow      Pain Score 0     Pain Loc      Pain Edu?      Excl. in [place]?    No data found.  Updated Vital Signs BP 108/71 (BP Location: Left Arm)   Pulse 76   Temp 98.8 F (37.1 C) (Oral)   Resp 16   SpO2 98%  Physical Exam Vitals and nursing note reviewed.  Constitutional:      Appearance: Normal appearance.  HENT:     Mouth/Throat:     Pharynx: Oropharynx is clear.  Eyes:     Conjunctiva/sclera: Conjunctivae normal.  Cardiovascular:     Rate and Rhythm: Normal rate and regular rhythm.     Heart sounds: Normal heart sounds.  Pulmonary:     Effort: Pulmonary effort is normal.     Breath sounds: Normal breath sounds.  Abdominal:     General: Bowel sounds are normal.     Tenderness: There is no abdominal tenderness. There is no guarding.  Neurological:     Mental Status: She is alert and oriented to person, place, and time.      UC Treatments / Results  Labs (all labs ordered are listed, but only abnormal results are displayed) Labs Reviewed  HIV ANTIBODY (ROUTINE TESTING W REFLEX)  RPR  CERVICOVAGINAL ANCILLARY ONLY    EKG   Radiology No results found.  Procedures Procedures (including critical care time)  Medications Ordered in UC Medications - No data to display  Initial Impression / Assessment and Plan / UC Course  I have reviewed the triage vital signs and the nursing notes.  Pertinent labs & imaging results that were available during my care of the patient were reviewed by me and considered in my  medical decision making (see chart for details).  Cytology swab pending.  HIV and RPR pending. Flagyl twice daily for 7 days.  Additionally sent fluconazole as patient has history of yeast infection with antibiotic use.  Recommend abstaining from intercourse until results return. Patient agrees to plan  Final Clinical Impressions(s) / UC Diagnoses   Final diagnoses:  Vaginal discharge  BV (bacterial vaginosis)  Screen for STD (sexually transmitted disease)     Discharge Instructions      We will call you with any positive results from your tests. Please take the medicine as prescribed.  Please do not drink alcohol while using this medicine as it will make you very sick.  Please abstain from intercourse until your results return.    ED Prescriptions     Medication Sig Dispense Auth. Provider   metroNIDAZOLE (FLAGYL) 500 MG tablet Take 1 tablet (500 mg total) by mouth 2 (two) times daily for 7 days. 14 tablet Melquisedec Journey, PA-C   fluconazole (DIFLUCAN) 150 MG tablet Take 1 tablet (150 mg total) by mouth once as needed for up to 2 doses (take one pill on day 1, and the second pill 3 days later if symptoms do not improve). 2 tablet Verdon Ferrante, Lurena Joiner, PA-C      PDMP not reviewed this encounter.   Hadasah Brugger, [place] 05/25/22 1810

## 2022-05-25 NOTE — Discharge Instructions (Signed)
We will call you with any positive results from your tests. Please take the medicine as prescribed.  Please do not drink alcohol while using this medicine as it will make you very sick.  Please abstain from intercourse until your results return.

## 2022-05-25 NOTE — ED Triage Notes (Signed)
Pt reports vaginal discharge and mild odor x 1.5 days. States she believes it is BV. Requesting STD testing.

## 2022-05-26 LAB — RPR: RPR Ser Ql: NONREACTIVE

## 2022-05-28 LAB — CERVICOVAGINAL ANCILLARY ONLY
Bacterial Vaginitis (gardnerella): NEGATIVE
Candida Glabrata: POSITIVE — AB
Candida Vaginitis: NEGATIVE
Chlamydia: NEGATIVE
Comment: NEGATIVE
Comment: NEGATIVE
Comment: NEGATIVE
Comment: NEGATIVE
Comment: NEGATIVE
Comment: NORMAL
Neisseria Gonorrhea: NEGATIVE
Trichomonas: NEGATIVE

## 2022-07-01 ENCOUNTER — Ambulatory Visit (HOSPITAL_COMMUNITY)
Admission: RE | Admit: 2022-07-01 | Discharge: 2022-07-01 | Disposition: A | Discharge status: Home / Self Care | Payer: Medicaid Other | Source: Ambulatory Visit | Attending: Emergency Medicine | Admitting: Emergency Medicine

## 2022-07-01 ENCOUNTER — Encounter (HOSPITAL_COMMUNITY): Payer: Self-pay

## 2022-07-01 VITALS — BP 109/59 | HR 97 | Temp 98.5°F | Resp 16

## 2022-07-01 DIAGNOSIS — N898 Other specified noninflammatory disorders of vagina: Secondary | ICD-10-CM | POA: Diagnosis not present

## 2022-07-01 MED ORDER — FLUCONAZOLE 150 MG PO TABS: 150.0000 mg | ORAL_TABLET | Freq: Once | ORAL | 0 refills | Status: DC | PRN

## 2022-07-01 NOTE — ED Triage Notes (Signed)
Patient having vaginal itching onset a day. No discharge. No urinary symptoms.

## 2022-07-01 NOTE — ED Provider Notes (Signed)
[facility]    CSN: [id number] Arrival date & time: 07/01/22  1101      History   Chief Complaint Chief Complaint  Patient presents with   Vaginal Itching    Entered by patient   SEXUALLY TRANSMITTED DISEASE    Testing     HPI Xayla Chauncey Cruel Chojnowski is a 31 y.o. female.   Presents with vaginal itching beginning this morning.  Denies irritation, odor, discharge, urinary symptoms, lower abdominal pain or pressure, flank pain.  History of herpes, trach.  Endorses only cunniligus, denies penetrative sexual activity.  Denies known exposure.  Past Medical History:  Diagnosis Date   Herpes    Trichomonas     Patient Active Problem List   Diagnosis Date Noted   Supervision of other normal pregnancy, antepartum 11/09/2020   Contraception 09/12/2016    Past Surgical History:  Procedure Laterality Date   NO PAST SURGERIES      OB History     Gravida  7   Para  3   Term  3   Preterm  0   AB  4   Living  3      SAB  1   IAB  2   Ectopic  0   Multiple      Live Births  3            Home Medications    Prior to Admission medications   Medication Sig Start Date End Date Taking? Authorizing Provider  fluconazole (DIFLUCAN) 150 MG tablet Take 1 tablet (150 mg total) by mouth once as needed for up to 2 doses (take one pill on day 1, and the second pill 3 days later if symptoms do not improve). 05/25/22   Rising, Rebecca, PA-C  fluticasone (FLONASE) 50 MCG/ACT nasal spray Place 1 spray into both nostrils daily. 03/09/22   Talbot Grumbling, FNP    Family History Family History  Problem Relation Age of Onset   Healthy Father    Hypertension Mother    Breast cancer Mother     Social History Social History   Tobacco Use   Smoking status: Former    Types: Cigarettes   Smokeless tobacco: Never  Vaping Use   Vaping Use: Every day  Substance Use Topics   Alcohol use: Not Currently   Drug use: Yes    Types: Marijuana      Allergies   Patient has no known allergies.   Review of Systems Review of Systems  Constitutional: Negative.   Respiratory: Negative.    Genitourinary: Negative.   Skin: Negative.      Physical Exam Triage Vital Signs ED Triage Vitals  Enc Vitals Group     BP 07/01/22 1123 (!) 109/59     Pulse Rate 07/01/22 1123 97     Resp 07/01/22 1123 16     Temp 07/01/22 1123 98.5 F (36.9 C)     Temp Source 07/01/22 1123 Oral     SpO2 07/01/22 1123 (!) 78 %     Weight --      Height --      Head Circumference --      Peak Flow --      Pain Score 07/01/22 1125 0     Pain Loc --      Pain Edu? --      Excl. in [place]? --    No data found.  Updated Vital Signs BP (!) 109/59 (BP Location: Right Arm)  Pulse 97   Temp 98.5 F (36.9 C) (Oral)   Resp 16   LMP 06/14/2022 (Exact Date)   SpO2 (!) 78%   Visual Acuity Right Eye Distance:   Left Eye Distance:   Bilateral Distance:    Right Eye Near:   Left Eye Near:    Bilateral Near:     Physical Exam Constitutional:      Appearance: Normal appearance.  HENT:     Head: Normocephalic.  Eyes:     Extraocular Movements: Extraocular movements intact.  Pulmonary:     Effort: Pulmonary effort is normal.  Genitourinary:    Comments: deferred Skin:    General: Skin is warm and dry.  Neurological:     Mental Status: She is alert and oriented to person, place, and time. Mental status is at baseline.  Psychiatric:        Mood and Affect: Mood normal.        Behavior: Behavior normal.      UC Treatments / Results  Labs (all labs ordered are listed, but only abnormal results are displayed) Labs Reviewed - No data to display  EKG   Radiology No results found.  Procedures Procedures (including critical care time)  Medications Ordered in UC Medications - No data to display  Initial Impression / Assessment and Plan / UC Course  I have reviewed the triage vital signs and the nursing notes.  Pertinent labs &  imaging results that were available during my care of the patient were reviewed by me and considered in my medical decision making (see chart for details).  Vaginal itching  We will prophylactically treat for yeast, Diflucan prescribed and discussed administration, STI labs pending will treat per protocol, advised abstinence until lab results, and/or treatment is complete, advised condom use during all sexual encounters moving, may follow-up with urgent care as needed  Final Clinical Impressions(s) / UC Diagnoses   Final diagnoses:  None   Discharge Instructions   None    ED Prescriptions   None    PDMP not reviewed this encounter.   Valinda Hoar, NP 07/01/22 1157

## 2022-07-01 NOTE — Discharge Instructions (Signed)
Today you are being treated prophylactically for yeast.   Take diflucan 150 mg once, if symptoms still present in 3 days then you may take second pill   Yeast infections which are caused by a naturally occurring fungus called candida. Vaginosis is an inflammation of the vagina that can result in discharge, itching and pain. The cause is usually a change in the normal balance of vaginal bacteria or an infection. Vaginosis can also result from reduced estrogen levels after menopause.  Labs pending 2-3 days, you will be contacted if positive for any sti and treatment will be sent to the pharmacy, you will have to return to the clinic if positive for gonorrhea to receive treatment   Please refrain from having sex until labs results, if positive please refrain from having sex until treatment complete and symptoms resolve   If positive for Chlamydia  gonorrhea or trichomoniasis please notify partner or partners so they may tested as well  Moving forward, it is recommended you use some form of protection against the transmission of sti infections  such as condoms or dental dams with each sexual encounter    In addition:   Avoid baths, hot tubs and whirlpool spas.  Don't use scented or harsh soaps, such as those with deodorant or antibacterial action. Avoid irritants. These include scented tampons and pads. Wipe from front to back after using the toilet.  Don't douche. Your vagina doesn't require cleansing other than normal bathing.  Use a  condom. Wear cotton underwear, this fabric helps absorb moisture   

## 2022-07-02 LAB — CERVICOVAGINAL ANCILLARY ONLY
Bacterial Vaginitis (gardnerella): NEGATIVE
Candida Glabrata: NEGATIVE
Candida Vaginitis: POSITIVE — AB
Chlamydia: NEGATIVE
Comment: NEGATIVE
Comment: NEGATIVE
Comment: NEGATIVE
Comment: NEGATIVE
Comment: NEGATIVE
Comment: NORMAL
Neisseria Gonorrhea: NEGATIVE
Trichomonas: POSITIVE — AB

## 2022-07-03 ENCOUNTER — Telehealth (HOSPITAL_COMMUNITY): Payer: Self-pay | Admitting: Emergency Medicine

## 2022-07-03 MED ORDER — METRONIDAZOLE 500 MG PO TABS: 500.0000 mg | ORAL_TABLET | Freq: Two times a day (BID) | ORAL | 0 refills | Status: DC

## 2022-07-03 MED ORDER — FLUCONAZOLE 150 MG PO TABS: 150.0000 mg | ORAL_TABLET | Freq: Once | ORAL | 0 refills | Status: AC

## 2022-08-09 ENCOUNTER — Encounter (HOSPITAL_COMMUNITY): Payer: Self-pay

## 2022-08-09 ENCOUNTER — Ambulatory Visit (HOSPITAL_COMMUNITY)
Admission: RE | Admit: 2022-08-09 | Discharge: 2022-08-09 | Disposition: A | Discharge status: Home / Self Care | Payer: Medicaid Other | Source: Ambulatory Visit | Attending: Emergency Medicine | Admitting: Emergency Medicine

## 2022-08-09 VITALS — BP 109/61 | HR 78 | Temp 99.1°F | Resp 18

## 2022-08-09 DIAGNOSIS — N898 Other specified noninflammatory disorders of vagina: Secondary | ICD-10-CM

## 2022-08-09 MED ORDER — METRONIDAZOLE 500 MG PO TABS: 500.0000 mg | ORAL_TABLET | Freq: Two times a day (BID) | ORAL | 0 refills | Status: DC

## 2022-08-09 NOTE — ED Provider Notes (Signed)
[facility]    CSN: [id number] Arrival date & time: 08/09/22  1056      History   Chief Complaint Chief Complaint  Patient presents with   Vaginal Discharge    Entered by patient    HPI Tymber Chauncey Cruel Filosa is a 31 y.o. female.  Patient complaining of vaginal discharge that started this morning.  Patient reports fishy odor.  Patient denies any abdominal pain or pelvic pain.  Patient denies any vaginal itching . Patient denies any known exposure to any STD.  Patient reports onset of symptoms may have began after receiving oral sex.  Patient denies any new sexual partners. LMP was approximately 08/02/2022.   Vaginal Discharge Associated symptoms: no abdominal pain, no dyspareunia, no dysuria, no fever, no nausea and no vomiting     Past Medical History:  Diagnosis Date   Herpes    Trichomonas     Patient Active Problem List   Diagnosis Date Noted   Supervision of other normal pregnancy, antepartum 11/09/2020   Contraception 09/12/2016    Past Surgical History:  Procedure Laterality Date   NO PAST SURGERIES      OB History     Gravida  7   Para  3   Term  3   Preterm  0   AB  4   Living  3      SAB  1   IAB  2   Ectopic  0   Multiple      Live Births  3            Home Medications    Prior to Admission medications   Medication Sig Start Date End Date Taking? Authorizing Provider  metroNIDAZOLE (FLAGYL) 500 MG tablet Take 1 tablet (500 mg total) by mouth 2 (two) times daily. 08/09/22  Yes Flossie Dibble, NP    Family History Family History  Problem Relation Age of Onset   Healthy Father    Hypertension Mother    Breast cancer Mother     Social History Social History   Tobacco Use   Smoking status: Former    Types: Cigarettes   Smokeless tobacco: Never  Vaping Use   Vaping Use: Every day  Substance Use Topics   Alcohol use: Not Currently   Drug use: Yes    Types: Marijuana     Allergies   Patient has  no known allergies.   Review of Systems Review of Systems  Constitutional:  Negative for chills and fever.  Gastrointestinal:  Negative for abdominal pain, blood in stool, constipation, diarrhea, nausea and vomiting.  Genitourinary:  Positive for vaginal discharge. Negative for decreased urine volume, difficulty urinating, dyspareunia, dysuria, flank pain, frequency, genital sores, hematuria, menstrual problem, pelvic pain, urgency, vaginal bleeding and vaginal pain.     Physical Exam Triage Vital Signs ED Triage Vitals [08/09/22 1139]  Enc Vitals Group     BP 109/61     Pulse Rate 78     Resp 18     Temp 99.1 F (37.3 C)     Temp Source Oral     SpO2 100 %     Weight      Height      Head Circumference      Peak Flow      Pain Score 0     Pain Loc      Pain Edu?      Excl. in [place]?    No data found.  Updated Vital Signs BP 109/61 (BP Location: Left Arm)   Pulse 78   Temp 99.1 F (37.3 C) (Oral)   Resp 18   LMP 08/02/2022   SpO2 100%      Physical Exam Vitals and nursing note reviewed.  Constitutional:      Appearance: Normal appearance.  Genitourinary:    Comments: Deferred Exam  Neurological:     Mental Status: She is alert.      UC Treatments / Results  Labs (all labs ordered are listed, but only abnormal results are displayed) Labs Reviewed  CERVICOVAGINAL ANCILLARY ONLY    EKG   Radiology No results found.  Procedures Procedures (including critical care time)  Medications Ordered in UC Medications - No data to display  Initial Impression / Assessment and Plan / UC Course  I have reviewed the triage vital signs and the nursing notes.  Pertinent labs & imaging results that were available during my care of the patient were reviewed by me and considered in my medical decision making (see chart for details).     Patient was evaluated due to vaginal discharge.  Due to symptomology, Flagyl was sent to the pharmacy.  Patient was educated on  possible side effects of medication.  Vaginal swab is pending.  Patient was made aware of results reporting protocol.  Patient was made aware to refrain from any sexual activity at this time.  Patient verbalized understanding of instructions.  Final Clinical Impressions(s) / UC Diagnoses   Final diagnoses:  Vaginal discharge     Discharge Instructions      Your vaginal swab is pending, we will call you if any of the test results are positive, usually takes 2 to 3 days to get the results back.  You may view these results on MyChart.   Flagyl  has been sent to the pharmacy, you will take this medication 2 times daily for the next 7 days.  Please refrain from any alcohol use while taking this medication.   Please refrain from any sexual activity for the next 7 days and until receiving test results.      ED Prescriptions     Medication Sig Dispense Auth. Provider   metroNIDAZOLE (FLAGYL) 500 MG tablet Take 1 tablet (500 mg total) by mouth 2 (two) times daily. 14 tablet Debby Freiberg, NP      PDMP not reviewed this encounter.   Debby Freiberg, NP 08/09/22 1248

## 2022-08-09 NOTE — ED Triage Notes (Signed)
Pt c/o vaginal discharge with odor this morning. States feels like a yeast infection but wants STD testing.

## 2022-08-09 NOTE — Discharge Instructions (Addendum)
Your vaginal swab is pending, we will call you if any of the test results are positive, usually takes 2 to 3 days to get the results back.  You may view these results on MyChart.   Flagyl  has been sent to the pharmacy, you will take this medication 2 times daily for the next 7 days.  Please refrain from any alcohol use while taking this medication.   Please refrain from any sexual activity for the next 7 days and until receiving test results.

## 2022-08-10 LAB — CERVICOVAGINAL ANCILLARY ONLY
Bacterial Vaginitis (gardnerella): POSITIVE — AB
Candida Glabrata: NEGATIVE
Candida Vaginitis: NEGATIVE
Chlamydia: NEGATIVE
Comment: NEGATIVE
Comment: NEGATIVE
Comment: NEGATIVE
Comment: NEGATIVE
Comment: NEGATIVE
Comment: NORMAL
Neisseria Gonorrhea: NEGATIVE
Trichomonas: NEGATIVE

## 2022-09-21 ENCOUNTER — Ambulatory Visit (HOSPITAL_COMMUNITY)
Admission: RE | Admit: 2022-09-21 | Discharge: 2022-09-21 | Disposition: A | Discharge status: Home / Self Care | Payer: Medicaid Other | Source: Ambulatory Visit | Attending: Internal Medicine | Admitting: Internal Medicine

## 2022-09-21 ENCOUNTER — Encounter (HOSPITAL_COMMUNITY): Payer: Self-pay

## 2022-09-21 VITALS — BP 116/53 | HR 75 | Temp 98.1°F | Resp 16

## 2022-09-21 DIAGNOSIS — N760 Acute vaginitis: Secondary | ICD-10-CM | POA: Insufficient documentation

## 2022-09-21 DIAGNOSIS — B9689 Other specified bacterial agents as the cause of diseases classified elsewhere: Secondary | ICD-10-CM | POA: Diagnosis not present

## 2022-09-21 DIAGNOSIS — Z113 Encounter for screening for infections with a predominantly sexual mode of transmission: Secondary | ICD-10-CM

## 2022-09-21 DIAGNOSIS — N898 Other specified noninflammatory disorders of vagina: Secondary | ICD-10-CM | POA: Insufficient documentation

## 2022-09-21 MED ORDER — METRONIDAZOLE 500 MG PO TABS: 500.0000 mg | ORAL_TABLET | Freq: Two times a day (BID) | ORAL | 0 refills | Status: DC

## 2022-09-21 NOTE — ED Provider Notes (Signed)
[facility]    CSN: [id number] Arrival date & time: 09/21/22  1654      History   Chief Complaint Chief Complaint  Patient presents with   appt 5   Vaginal Discharge    HPI Beverly Marquez is a 31 y.o. female.   Patient presents urgent care for evaluation of watery/white discharge with foul odor for the last few days.  She states "I know this is BV".  She has frequent bacterial vaginitis infections especially after sexual intercourse.  Patient had not had intercourse for few weeks until a few days ago.  She states this always happens after sexual intercourse.  Denies vaginal itching, rash, pain, urinary symptoms, and fever/chills.  No nausea, vomiting, dizziness, or abdominal pain.  Declines HIV and syphilis testing.  Sexually active with normal female partner without condoms.   Vaginal Discharge   Past Medical History:  Diagnosis Date   Herpes    Trichomonas     Patient Active Problem List   Diagnosis Date Noted   Supervision of other normal pregnancy, antepartum 11/09/2020   Contraception 09/12/2016    Past Surgical History:  Procedure Laterality Date   NO PAST SURGERIES      OB History     Gravida  7   Para  3   Term  3   Preterm  0   AB  4   Living  3      SAB  1   IAB  2   Ectopic  0   Multiple      Live Births  3            Home Medications    Prior to Admission medications   Medication Sig Start Date End Date Taking? Authorizing Provider  metroNIDAZOLE (FLAGYL) 500 MG tablet Take 1 tablet (500 mg total) by mouth 2 (two) times daily. 09/21/22   Carlisle Beers, FNP    Family History Family History  Problem Relation Age of Onset   Healthy Father    Hypertension Mother    Breast cancer Mother     Social History Social History   Tobacco Use   Smoking status: Former    Types: Cigarettes   Smokeless tobacco: Never  Vaping Use   Vaping Use: Every day  Substance Use Topics   Alcohol use: Not Currently    Drug use: Yes    Types: Marijuana     Allergies   Patient has no known allergies.   Review of Systems Review of Systems  Genitourinary:  Positive for vaginal discharge.  Per HPI   Physical Exam Triage Vital Signs ED Triage Vitals  Enc Vitals Group     BP 09/21/22 1719 (!) 116/53     Pulse Rate 09/21/22 1719 75     Resp 09/21/22 1719 16     Temp 09/21/22 1719 98.1 F (36.7 C)     Temp Source 09/21/22 1719 Oral     SpO2 09/21/22 1719 100 %     Weight --      Height --      Head Circumference --      Peak Flow --      Pain Score 09/21/22 1718 0     Pain Loc --      Pain Edu? --      Excl. in [place]? --    No data found.  Updated Vital Signs BP (!) 116/53 (BP Location: Right Arm)   Pulse 75  Temp 98.1 F (36.7 C) (Oral)   Resp 16   LMP 07/27/2022   SpO2 100%   Visual Acuity Right Eye Distance:   Left Eye Distance:   Bilateral Distance:    Right Eye Near:   Left Eye Near:    Bilateral Near:     Physical Exam Vitals and nursing note reviewed.  Constitutional:      Appearance: She is not ill-appearing or toxic-appearing.  HENT:     Head: Normocephalic and atraumatic.     Right Ear: Hearing and external ear normal.     Left Ear: Hearing and external ear normal.     Nose: Nose normal.     Mouth/Throat:     Lips: Pink.  Eyes:     General: Lids are normal. Vision grossly intact. Gaze aligned appropriately.     Extraocular Movements: Extraocular movements intact.     Conjunctiva/sclera: Conjunctivae normal.  Pulmonary:     Effort: Pulmonary effort is normal.  Genitourinary:    Comments: Deferred. Musculoskeletal:     Cervical back: Neck supple.  Skin:    General: Skin is warm and dry.     Capillary Refill: Capillary refill takes less than 2 seconds.     Findings: No rash.  Neurological:     General: No focal deficit present.     Mental Status: She is alert and oriented to person, place, and time. Mental status is at baseline.     Cranial Nerves:  No dysarthria or facial asymmetry.  Psychiatric:        Mood and Affect: Mood normal.        Speech: Speech normal.        Behavior: Behavior normal.        Thought Content: Thought content normal.        Judgment: Judgment normal.      UC Treatments / Results  Labs (all labs ordered are listed, but only abnormal results are displayed) Labs Reviewed  CERVICOVAGINAL ANCILLARY ONLY    EKG   Radiology No results found.  Procedures Procedures (including critical care time)  Medications Ordered in UC Medications - No data to display  Initial Impression / Assessment and Plan / UC Course  I have reviewed the triage vital signs and the nursing notes.  Pertinent labs & imaging results that were available during my care of the patient were reviewed by me and considered in my medical decision making (see chart for details).   STD screening  STI labs pending.  Patient declines HIV and syphilis testing today.  Will notify patient of positive results and treat accordingly when labs come back.  We will go ahead and treat for bacterial vaginosis as patient is exhibiting classic symptoms with positive history of frequent BV infections.  Flagyl twice daily for the next 7 days sent to pharmacy.  Advised patient to avoid use of alcohol while taking medication and for 42 to 78 hours after finishing medication due to adverse side effects.  Patient to avoid sexual intercourse until screening testing comes back.  Education provided regarding safe sexual practices and patient encouraged to use protection to prevent spread of STIs.   Discussed physical exam and available lab work findings in clinic with patient.  Counseled patient regarding appropriate use of medications and potential side effects for all medications recommended or prescribed today. Discussed red flag signs and symptoms of worsening condition,when to call the PCP office, return to urgent care, and when to seek higher level of  care in  the emergency department. Patient verbalizes understanding and agreement with plan. All questions answered. Patient discharged in stable condition.    Final Clinical Impressions(s) / UC Diagnoses   Final diagnoses:  BV (bacterial vaginosis)  Vaginal discharge  Screen for STD (sexually transmitted disease)     Discharge Instructions      Your STD testing has been sent to the lab and will come back in the next 2 to 3 days.  We will call you if any of your results are positive requiring treatment and treat you at that time.   Flagyl 2 times a day for the next 7 days. No alcohol during or for 48-72 hours after last dose.  Avoid sexual intercourse until your STD results come back.  If any of your STD results are positive, you will need to avoid sexual intercourse for 7 days while you are being treated to prevent spread of STD.  Condom use is the best way to prevent spread of STDs.  Return to urgent care as needed.     ED Prescriptions     Medication Sig Dispense Auth. Provider   metroNIDAZOLE (FLAGYL) 500 MG tablet Take 1 tablet (500 mg total) by mouth 2 (two) times daily. 14 tablet Carlisle Beers, FNP      PDMP not reviewed this encounter.   Carlisle Beers, [place] 09/21/22 [phone]

## 2022-09-21 NOTE — Discharge Instructions (Signed)
Your STD testing has been sent to the lab and will come back in the next 2 to 3 days.  We will call you if any of your results are positive requiring treatment and treat you at that time.   Flagyl 2 times a day for the next 7 days. No alcohol during or for 48-72 hours after last dose.  Avoid sexual intercourse until your STD results come back.  If any of your STD results are positive, you will need to avoid sexual intercourse for 7 days while you are being treated to prevent spread of STD.  Condom use is the best way to prevent spread of STDs.  Return to urgent care as needed.

## 2022-09-21 NOTE — ED Triage Notes (Signed)
Pt reports that believes has BV as has vaginal discharge and fishy odor that started today.  Pt requesting STD testing

## 2022-09-24 LAB — CERVICOVAGINAL ANCILLARY ONLY
Bacterial Vaginitis (gardnerella): POSITIVE — AB
Candida Glabrata: NEGATIVE
Candida Vaginitis: NEGATIVE
Chlamydia: NEGATIVE
Comment: NEGATIVE
Comment: NEGATIVE
Comment: NEGATIVE
Comment: NEGATIVE
Comment: NEGATIVE
Comment: NORMAL
Neisseria Gonorrhea: NEGATIVE
Trichomonas: NEGATIVE

## 2022-10-24 ENCOUNTER — Ambulatory Visit (INDEPENDENT_AMBULATORY_CARE_PROVIDER_SITE_OTHER): Payer: Medicaid Other | Admitting: Obstetrics

## 2022-10-24 ENCOUNTER — Encounter: Payer: Self-pay | Admitting: Obstetrics

## 2022-10-24 VITALS — BP 104/58 | HR 78 | Ht 65.0 in | Wt 153.0 lb

## 2022-10-24 DIAGNOSIS — Z9889 Other specified postprocedural states: Secondary | ICD-10-CM | POA: Diagnosis not present

## 2022-10-24 DIAGNOSIS — N939 Abnormal uterine and vaginal bleeding, unspecified: Secondary | ICD-10-CM

## 2022-10-24 DIAGNOSIS — N946 Dysmenorrhea, unspecified: Secondary | ICD-10-CM | POA: Diagnosis not present

## 2022-10-24 MED ORDER — IBUPROFEN 800 MG PO TABS: 800.0000 mg | ORAL_TABLET | Freq: Three times a day (TID) | ORAL | 5 refills | Status: DC | PRN

## 2022-10-24 MED ORDER — MEGESTROL ACETATE 40 MG PO TABS: 40.0000 mg | ORAL_TABLET | Freq: Two times a day (BID) | ORAL | 0 refills | Status: DC

## 2022-10-24 NOTE — Progress Notes (Signed)
Patient ID: Beverly Marquez, female   DOB: [birth year], 32 y.o.   MRN: [id number]  Chief Complaint  Patient presents with   Vaginal Bleeding    HPI Beverly Marquez is a 32 y.o. female.  Vaginal bleeding for the past 2 weeks after Medical Abortion.  Denies pelvic pain, fever/chills, dysuria. HPI  Past Medical History:  Diagnosis Date   Herpes    Trichomonas     Past Surgical History:  Procedure Laterality Date   NO PAST SURGERIES      Family History  Problem Relation Age of Onset   Healthy Father    Hypertension Mother    Breast cancer Mother     Social History Social History   Tobacco Use   Smoking status: Former    Types: Cigarettes   Smokeless tobacco: Never  Vaping Use   Vaping Use: Every day  Substance Use Topics   Alcohol use: Not Currently   Drug use: Yes    Types: Marijuana    No Known Allergies  Current Outpatient Medications  Medication Sig Dispense Refill   ibuprofen (ADVIL) 800 MG tablet Take 1 tablet (800 mg total) by mouth every 8 (eight) hours as needed. 30 tablet 5   megestrol (MEGACE) 40 MG tablet Take 1 tablet (40 mg total) by mouth 2 (two) times daily. 30 tablet 0   metroNIDAZOLE (FLAGYL) 500 MG tablet Take 1 tablet (500 mg total) by mouth 2 (two) times daily. (Patient not taking: Reported on 10/24/2022) 14 tablet 0   No current facility-administered medications for this visit.    Review of Systems Review of Systems Constitutional: negative for fatigue and weight loss Respiratory: negative for cough and wheezing Cardiovascular: negative for chest pain, fatigue and palpitations Gastrointestinal: negative for abdominal pain and change in bowel habits Genitourinary: positive for AUB Integument/breast: negative for nipple discharge Musculoskeletal:negative for myalgias Neurological: negative for gait problems and tremors Behavioral/Psych: negative for abusive relationship, depression Endocrine: negative for temperature intolerance       Blood pressure (!) 104/58, pulse 78, height 5\' 5"  (1.651 m), weight 153 lb (69.4 kg), last menstrual period 10/10/2022.  Physical Exam Physical Exam General:   Alert and no distress  Skin:   no rash or abnormalities  Lungs:   clear to auscultation bilaterally  Heart:   regular rate and rhythm, S1, S2 normal, no murmur, click, rub or gallop  Breasts:   Not examined  Abdomen:  normal findings: no organomegaly, soft, non-tender and no hernia  Pelvis:  External genitalia: normal general appearance Urinary system: urethral meatus normal and bladder without fullness, nontender Vaginal:  blood in vault.  without tenderness, induration or masses Cervix: normal appearance, dark blood from os Adnexa: normal bimanual exam Uterus: anteverted and non-tender, normal size    I have spent a total of 20 minutes of face-to-face time, excluding clinical staff time, reviewing notes and preparing to see patient, ordering tests and/or medications, and counseling the patient.   Data Reviewed Wet Prep Cultures  Assessment     1. Status post elective abortion  2. Abnormal uterine bleeding (AUB) Rx: - megestrol (MEGACE) 40 MG tablet; Take 1 tablet (40 mg total) by mouth 2 (two) times daily.  Dispense: 30 tablet; Refill: 0 - follow up in 48 hours for re-evaluation for retained POC if heavy bleeding continues  3. Dysmenorrhea Rx: - ibuprofen (ADVIL) 800 MG tablet; Take 1 tablet (800 mg total) by mouth every 8 (eight) hours as needed.  Dispense: 30 tablet; Refill:  5     Plan   Follow up in 48 hours if bleeding continues.  Meds ordered this encounter  Medications   megestrol (MEGACE) 40 MG tablet    Sig: Take 1 tablet (40 mg total) by mouth 2 (two) times daily.    Dispense:  30 tablet    Refill:  0   ibuprofen (ADVIL) 800 MG tablet    Sig: Take 1 tablet (800 mg total) by mouth every 8 (eight) hours as needed.    Dispense:  30 tablet    Refill:  5     Labradford Schnitker A. Vivien Rota 10/24/2022

## 2022-10-24 NOTE — Progress Notes (Signed)
32 y.o GYN presents for vaginal bleeding x 2 weeks,cramps, blood clots

## 2022-12-12 ENCOUNTER — Ambulatory Visit (HOSPITAL_COMMUNITY): Payer: Self-pay

## 2022-12-16 ENCOUNTER — Ambulatory Visit (HOSPITAL_COMMUNITY): Payer: Self-pay

## 2022-12-27 ENCOUNTER — Other Ambulatory Visit (HOSPITAL_COMMUNITY)
Admission: RE | Admit: 2022-12-27 | Discharge: 2022-12-27 | Disposition: A | Discharge status: Home / Self Care | Payer: Medicaid Other | Source: Ambulatory Visit | Attending: Obstetrics | Admitting: Obstetrics

## 2022-12-27 ENCOUNTER — Encounter: Payer: Self-pay | Admitting: Obstetrics

## 2022-12-27 ENCOUNTER — Ambulatory Visit: Payer: Medicaid Other | Admitting: Obstetrics

## 2022-12-27 VITALS — BP 108/66 | HR 64 | Ht 64.0 in | Wt 151.8 lb

## 2022-12-27 DIAGNOSIS — E569 Vitamin deficiency, unspecified: Secondary | ICD-10-CM

## 2022-12-27 DIAGNOSIS — N898 Other specified noninflammatory disorders of vagina: Secondary | ICD-10-CM | POA: Insufficient documentation

## 2022-12-27 MED ORDER — VITAFOL ULTRA 29-0.6-0.4-200 MG PO CAPS: 1.0000 | ORAL_CAPSULE | Freq: Every day | ORAL | 4 refills | Status: DC

## 2022-12-27 NOTE — Progress Notes (Signed)
Patient ID: Beverly Marquez, female   DOB: [birth year], 32 y.o.   MRN: [id number]  HPI Beverly Marquez is a 32 y.o. female.  Complains of vaginal dryness. HPI  Past Medical History:  Diagnosis Date   Herpes    Trichomonas     Past Surgical History:  Procedure Laterality Date   NO PAST SURGERIES      Family History  Problem Relation Age of Onset   Healthy Father    Hypertension Mother    Breast cancer Mother     Social History Social History   Tobacco Use   Smoking status: Former    Types: Cigarettes   Smokeless tobacco: Never  Vaping Use   Vaping Use: Every day  Substance Use Topics   Alcohol use: Not Currently   Drug use: Yes    Types: Marijuana    No Known Allergies  Current Outpatient Medications  Medication Sig Dispense Refill   Prenat-Fe Poly-Methfol-FA-DHA (VITAFOL ULTRA) 29-0.6-0.4-200 MG CAPS Take 1 capsule by mouth daily before breakfast. 90 capsule 4   ibuprofen (ADVIL) 800 MG tablet Take 1 tablet (800 mg total) by mouth every 8 (eight) hours as needed. 30 tablet 5   megestrol (MEGACE) 40 MG tablet Take 1 tablet (40 mg total) by mouth 2 (two) times daily. 30 tablet 0   metroNIDAZOLE (FLAGYL) 500 MG tablet Take 1 tablet (500 mg total) by mouth 2 (two) times daily. (Patient not taking: Reported on 10/24/2022) 14 tablet 0   No current facility-administered medications for this visit.    Review of Systems Review of Systems Constitutional: negative for fatigue and weight loss Respiratory: negative for cough and wheezing Cardiovascular: negative for chest pain, fatigue and palpitations Gastrointestinal: negative for abdominal pain and change in bowel habits Genitourinary: positive for vaginal discharge and dryness with intercourse Integument/breast: negative for nipple discharge Musculoskeletal:negative for myalgias Neurological: negative for gait problems and tremors Behavioral/Psych: negative for abusive relationship, depression Endocrine:  negative for temperature intolerance      Blood pressure 108/66, pulse 64, height '5\' 4"'$  (1.626 m), weight 151 lb 12.8 oz (68.9 kg), last menstrual period 12/21/2022.  Physical Exam Physical Exam General:   Alert and no distress  Skin:   no rash or abnormalities  Lungs:   clear to auscultation bilaterally  Heart:   regular rate and rhythm, S1, S2 normal, no murmur, click, rub or gallop  Breasts:   Not examined  Abdomen:  normal findings: no organomegaly, soft, non-tender and no hernia  Pelvis:  External genitalia: normal general appearance Urinary system: urethral meatus normal and bladder without fullness, nontender Vaginal: normal without tenderness, induration or masses Cervix: normal appearance Adnexa: normal bimanual exam Uterus: anteverted and non-tender, normal size    I have spent a total of 20 minutes of face-to-face time, excluding clinical staff time, reviewing notes and preparing to see patient, ordering tests and/or medications, and counseling the patient.   Data Reviewed Wet Prep  Assessment    1. Vaginal dryness Rx: - Cervicovaginal ancillary only - Probiotic recommended  2. Vitamin deficiency Rx: - Prenat-Fe Poly-Methfol-FA-DHA (VITAFOL ULTRA) 29-0.6-0.4-200 MG CAPS; Take 1 capsule by mouth daily before breakfast.  Dispense: 90 capsule; Refill: 4    Plan  Follow up prn  Meds ordered this encounter  Medications   Prenat-Fe Poly-Methfol-FA-DHA (VITAFOL ULTRA) 29-0.6-0.4-200 MG CAPS    Sig: Take 1 capsule by mouth daily before breakfast.    Dispense:  90 capsule    Refill:  4  Shelly Bombard, MD 12/27/2022 9:33 AM

## 2022-12-27 NOTE — Progress Notes (Signed)
Pt complains of vaginal dryness for awhile now. No other concerns at this time.

## 2022-12-28 LAB — CERVICOVAGINAL ANCILLARY ONLY
Bacterial Vaginitis (gardnerella): POSITIVE — AB
Candida Glabrata: NEGATIVE
Candida Vaginitis: NEGATIVE
Chlamydia: NEGATIVE
Comment: NEGATIVE
Comment: NEGATIVE
Comment: NEGATIVE
Comment: NEGATIVE
Comment: NEGATIVE
Comment: NORMAL
Neisseria Gonorrhea: NEGATIVE
Trichomonas: NEGATIVE

## 2022-12-29 ENCOUNTER — Other Ambulatory Visit: Payer: Self-pay | Admitting: Obstetrics

## 2022-12-29 DIAGNOSIS — B9689 Other specified bacterial agents as the cause of diseases classified elsewhere: Secondary | ICD-10-CM

## 2022-12-29 MED ORDER — METRONIDAZOLE 500 MG PO TABS: 500.0000 mg | ORAL_TABLET | Freq: Two times a day (BID) | ORAL | 0 refills | Status: DC

## 2023-01-30 ENCOUNTER — Ambulatory Visit (HOSPITAL_COMMUNITY)
Admission: RE | Admit: 2023-01-30 | Discharge: 2023-01-30 | Disposition: A | Discharge status: Home / Self Care | Payer: Medicaid Other | Source: Ambulatory Visit | Attending: Emergency Medicine | Admitting: Emergency Medicine

## 2023-01-30 ENCOUNTER — Encounter (HOSPITAL_COMMUNITY): Payer: Self-pay

## 2023-01-30 VITALS — BP 107/68 | HR 76 | Temp 98.0°F | Resp 13

## 2023-01-30 DIAGNOSIS — N898 Other specified noninflammatory disorders of vagina: Secondary | ICD-10-CM | POA: Diagnosis not present

## 2023-01-30 DIAGNOSIS — Z711 Person with feared health complaint in whom no diagnosis is made: Secondary | ICD-10-CM

## 2023-01-30 NOTE — Discharge Instructions (Addendum)
Check my chart for results. Follow up with PCP. Avoid sexual activity until results,treatment known and completed. Safe sex with all future sexual activity. Return as needed.

## 2023-01-30 NOTE — ED Triage Notes (Signed)
Pt c/o vaginal itching and discharge that started yesterday. Believes has yeast infection. Pt requesting STD testing today. Denies exposure.

## 2023-01-30 NOTE — ED Provider Notes (Signed)
[id number]    CSN: [id number] Arrival date & time: 01/30/23  1751      History   Chief Complaint Chief Complaint  Patient presents with   appt 6    HPI Beverly Marquez is a 32 y.o. female.   32 year old female, Beverly Marquez, presents to urgent care with chief complaint of vaginal itching and discharge that started yesterday.  Patient wants to have STI testing, denies exposure.  Patient states she is currently on medication for BV.  The history is provided by the patient. No language interpreter was used.    Past Medical History:  Diagnosis Date   Herpes    Trichomonas     Patient Active Problem List   Diagnosis Date Noted   Vaginal discharge 01/30/2023   Concern about sexually transmitted disease in female without diagnosis 01/30/2023   Supervision of other normal pregnancy, antepartum 11/09/2020   Contraception 09/12/2016    Past Surgical History:  Procedure Laterality Date   NO PAST SURGERIES      OB History     Gravida  7   Para  3   Term  3   Preterm  0   AB  4   Living  3      SAB  1   IAB  2   Ectopic  0   Multiple      Live Births  3            Home Medications    Prior to Admission medications   Medication Sig Start Date End Date Taking? Authorizing Provider  ibuprofen (ADVIL) 800 MG tablet Take 1 tablet (800 mg total) by mouth every 8 (eight) hours as needed. 10/24/22   Brock Bad, MD  megestrol (MEGACE) 40 MG tablet Take 1 tablet (40 mg total) by mouth 2 (two) times daily. 10/24/22   Brock Bad, MD  metroNIDAZOLE (FLAGYL) 500 MG tablet Take 1 tablet (500 mg total) by mouth 2 (two) times daily. 12/29/22   Brock Bad, MD  Prenat-Fe Poly-Methfol-FA-DHA (VITAFOL ULTRA) 29-0.6-0.4-200 MG CAPS Take 1 capsule by mouth daily before breakfast. 12/27/22   Brock Bad, MD    Family History Family History  Problem Relation Age of Onset   Healthy Father    Hypertension Mother    Breast  cancer Mother     Social History Social History   Tobacco Use   Smoking status: Former    Types: Cigarettes   Smokeless tobacco: Never  Vaping Use   Vaping Use: Every day  Substance Use Topics   Alcohol use: Not Currently   Drug use: Yes    Types: Marijuana     Allergies   Patient has no known allergies.   Review of Systems Review of Systems  Constitutional:  Negative for fever.  Gastrointestinal:  Negative for abdominal pain.  Genitourinary:  Positive for vaginal discharge. Negative for dysuria.       Vaginal itching  All other systems reviewed and are negative.    Physical Exam Triage Vital Signs ED Triage Vitals  Enc Vitals Group     BP      Pulse      Resp      Temp      Temp src      SpO2      Weight      Height      Head Circumference      Peak Flow  Pain Score      Pain Loc      Pain Edu?      Excl. in GC?    No data found.  Updated Vital Signs BP 107/68 (BP Location: Right Arm)   Pulse 76   Temp 98 F (36.7 C)   Resp 13   LMP 01/20/2023   SpO2 97%   Visual Acuity Right Eye Distance:   Left Eye Distance:   Bilateral Distance:    Right Eye Near:   Left Eye Near:    Bilateral Near:     Physical Exam Vitals and nursing note reviewed.  Constitutional:      General: She is not in acute distress.    Appearance: She is well-developed.  HENT:     Head: Normocephalic and atraumatic.  Eyes:     Conjunctiva/sclera: Conjunctivae normal.  Cardiovascular:     Rate and Rhythm: Normal rate and regular rhythm.     Heart sounds: No murmur heard. Pulmonary:     Effort: Pulmonary effort is normal. No respiratory distress.     Breath sounds: Normal breath sounds.  Abdominal:     Palpations: Abdomen is soft.     Tenderness: There is no abdominal tenderness.  Musculoskeletal:        General: No swelling.     Cervical back: Neck supple.  Skin:    General: Skin is warm and dry.     Capillary Refill: Capillary refill takes less than 2  seconds.  Neurological:     General: No focal deficit present.     Mental Status: She is alert and oriented to person, place, and time.     GCS: GCS eye subscore is 4. GCS verbal subscore is 5. GCS motor subscore is 6.  Psychiatric:        Attention and Perception: Attention normal.        Mood and Affect: Mood normal.        Speech: Speech normal.        Behavior: Behavior normal. Behavior is cooperative.      UC Treatments / Results  Labs (all labs ordered are listed, but only abnormal results are displayed) Labs Reviewed  CERVICOVAGINAL ANCILLARY ONLY    EKG   Radiology No results found.  Procedures Procedures (including critical care time)  Medications Ordered in UC Medications - No data to display  Initial Impression / Assessment and Plan / UC Course  I have reviewed the triage vital signs and the nursing notes.  Pertinent labs & imaging results that were available during my care of the patient were reviewed by me and considered in my medical decision making (see chart for details).    Patient verbalized understanding to this provider regarding plan of care  Ddx: Vaginal discharge, STI Final Clinical Impressions(s) / UC Diagnoses   Final diagnoses:  Vaginal discharge  Concern about sexually transmitted disease in female without diagnosis     Discharge Instructions      Check my chart for results. Follow up with PCP. Avoid sexual activity until results,treatment known and completed. Safe sex with all future sexual activity. Return as needed.     ED Prescriptions   None    PDMP not reviewed this encounter.   Clancy Gourd, NP 01/30/23 2023

## 2023-01-31 LAB — CERVICOVAGINAL ANCILLARY ONLY
Bacterial Vaginitis (gardnerella): POSITIVE — AB
Candida Glabrata: NEGATIVE
Candida Vaginitis: NEGATIVE
Chlamydia: NEGATIVE
Comment: NEGATIVE
Comment: NEGATIVE
Comment: NEGATIVE
Comment: NEGATIVE
Comment: NEGATIVE
Comment: NORMAL
Neisseria Gonorrhea: NEGATIVE
Trichomonas: NEGATIVE

## 2023-02-01 ENCOUNTER — Telehealth: Payer: Self-pay | Admitting: Emergency Medicine

## 2023-02-01 MED ORDER — METRONIDAZOLE 500 MG PO TABS: 500.0000 mg | ORAL_TABLET | Freq: Two times a day (BID) | ORAL | 0 refills | Status: DC

## 2023-02-26 ENCOUNTER — Encounter (HOSPITAL_COMMUNITY): Payer: Self-pay

## 2023-02-26 ENCOUNTER — Ambulatory Visit (HOSPITAL_COMMUNITY)
Admission: RE | Admit: 2023-02-26 | Discharge: 2023-02-26 | Disposition: A | Discharge status: Home / Self Care | Payer: Medicaid Other | Source: Ambulatory Visit | Attending: Family Medicine | Admitting: Family Medicine

## 2023-02-26 VITALS — BP 107/67 | HR 74 | Temp 98.7°F | Resp 18

## 2023-02-26 DIAGNOSIS — N898 Other specified noninflammatory disorders of vagina: Secondary | ICD-10-CM | POA: Insufficient documentation

## 2023-02-26 MED ORDER — METRONIDAZOLE 500 MG PO TABS: 500.0000 mg | ORAL_TABLET | Freq: Two times a day (BID) | ORAL | 0 refills | Status: DC

## 2023-02-26 NOTE — ED Triage Notes (Signed)
Vaginal itching onset today. Patient thinks she has bv and yeast.

## 2023-02-26 NOTE — Discharge Instructions (Signed)
You were seen today for possible BV infection. I have sent out flagyl to the pharmacy to take twice/day x 7 days.  The vaginal swab will be resulted tomorrow and if anything else is positive we will notify you for treatment.

## 2023-02-26 NOTE — ED Provider Notes (Signed)
[facility]    CSN: [id number] Arrival date & time: 02/26/23  1310      History   Chief Complaint Chief Complaint  Patient presents with   Vaginal Itching    Entered by patient    HPI Beverly Marquez is a 32 y.o. female.   Patient is here for vaginal itching that started today.  Fishy odor, regular d/c H/o BV and thinks this is it.  No other symptoms.        Past Medical History:  Diagnosis Date   Herpes    Trichomonas     Patient Active Problem List   Diagnosis Date Noted   Vaginal discharge 01/30/2023   Concern about sexually transmitted disease in female without diagnosis 01/30/2023   Supervision of other normal pregnancy, antepartum 11/09/2020   Contraception 09/12/2016    Past Surgical History:  Procedure Laterality Date   NO PAST SURGERIES      OB History     Gravida  7   Para  3   Term  3   Preterm  0   AB  4   Living  3      SAB  1   IAB  2   Ectopic  0   Multiple      Live Births  3            Home Medications    Prior to Admission medications   Medication Sig Start Date End Date Taking? Authorizing Provider  ibuprofen (ADVIL) 800 MG tablet Take 1 tablet (800 mg total) by mouth every 8 (eight) hours as needed. 10/24/22   Brock Bad, MD  megestrol (MEGACE) 40 MG tablet Take 1 tablet (40 mg total) by mouth 2 (two) times daily. 10/24/22   Brock Bad, MD  metroNIDAZOLE (FLAGYL) 500 MG tablet Take 1 tablet (500 mg total) by mouth 2 (two) times daily. 02/01/23   LampteyBritta Mccreedy, MD  Prenat-Fe Poly-Methfol-FA-DHA (VITAFOL ULTRA) 29-0.6-0.4-200 MG CAPS Take 1 capsule by mouth daily before breakfast. 12/27/22   Brock Bad, MD    Family History Family History  Problem Relation Age of Onset   Healthy Father    Hypertension Mother    Breast cancer Mother     Social History Social History   Tobacco Use   Smoking status: Former    Types: Cigarettes   Smokeless tobacco: Never  Vaping  Use   Vaping Use: Every day  Substance Use Topics   Alcohol use: Not Currently   Drug use: Yes    Types: Marijuana     Allergies   Patient has no known allergies.   Review of Systems Review of Systems  Constitutional: Negative.   HENT: Negative.    Respiratory: Negative.    Cardiovascular: Negative.   Gastrointestinal: Negative.   Genitourinary:  Positive for vaginal discharge.  Musculoskeletal: Negative.      Physical Exam Triage Vital Signs ED Triage Vitals  Enc Vitals Group     BP 02/26/23 1351 107/67     Pulse Rate 02/26/23 1351 74     Resp 02/26/23 1351 18     Temp 02/26/23 1351 98.7 F (37.1 C)     Temp Source 02/26/23 1351 Oral     SpO2 02/26/23 1351 97 %     Weight --      Height --      Head Circumference --      Peak Flow --  Pain Score 02/26/23 1354 0     Pain Loc --      Pain Edu? --      Excl. in GC? --    No data found.  Updated Vital Signs BP 107/67 (BP Location: Left Arm)   Pulse 74   Temp 98.7 F (37.1 C) (Oral)   Resp 18   LMP 01/20/2023 (Approximate)   SpO2 97%   Visual Acuity Right Eye Distance:   Left Eye Distance:   Bilateral Distance:    Right Eye Near:   Left Eye Near:    Bilateral Near:     Physical Exam Constitutional:      Appearance: Normal appearance.  Cardiovascular:     Rate and Rhythm: Normal rate and regular rhythm.  Pulmonary:     Effort: Pulmonary effort is normal.     Breath sounds: Normal breath sounds.  Neurological:     Mental Status: She is alert.      UC Treatments / Results  Labs (all labs ordered are listed, but only abnormal results are displayed) Labs Reviewed  CERVICOVAGINAL ANCILLARY ONLY    EKG   Radiology No results found.  Procedures Procedures (including critical care time)  Medications Ordered in UC Medications - No data to display  Initial Impression / Assessment and Plan / UC Course  I have reviewed the triage vital signs and the nursing notes.  Pertinent labs  & imaging results that were available during my care of the patient were reviewed by me and considered in my medical decision making (see chart for details).   Final Clinical Impressions(s) / UC Diagnoses   Final diagnoses:  Vaginal discharge     Discharge Instructions      You were seen today for possible BV infection. I have sent out flagyl to the pharmacy to take twice/day x 7 days.  The vaginal swab will be resulted tomorrow and if anything else is positive we will notify you for treatment.      ED Prescriptions     Medication Sig Dispense Auth. Provider   metroNIDAZOLE (FLAGYL) 500 MG tablet Take 1 tablet (500 mg total) by mouth 2 (two) times daily. 14 tablet Jannifer Franklin, MD      PDMP not reviewed this encounter.   Jannifer Franklin, MD 02/26/23 [phone]

## 2023-02-27 ENCOUNTER — Telehealth (HOSPITAL_COMMUNITY): Payer: Self-pay | Admitting: Emergency Medicine

## 2023-02-27 LAB — CERVICOVAGINAL ANCILLARY ONLY
Bacterial Vaginitis (gardnerella): POSITIVE — AB
Candida Glabrata: NEGATIVE
Candida Vaginitis: POSITIVE — AB
Chlamydia: NEGATIVE
Comment: NEGATIVE
Comment: NEGATIVE
Comment: NEGATIVE
Comment: NEGATIVE
Comment: NEGATIVE
Comment: NORMAL
Neisseria Gonorrhea: NEGATIVE
Trichomonas: NEGATIVE

## 2023-02-27 MED ORDER — FLUCONAZOLE 150 MG PO TABS: 150.0000 mg | ORAL_TABLET | Freq: Once | ORAL | 0 refills | Status: AC

## 2023-03-29 ENCOUNTER — Encounter (HOSPITAL_COMMUNITY): Payer: Self-pay

## 2023-03-29 ENCOUNTER — Ambulatory Visit (HOSPITAL_COMMUNITY): Payer: Self-pay

## 2023-03-29 ENCOUNTER — Ambulatory Visit (HOSPITAL_COMMUNITY)
Admission: EM | Admit: 2023-03-29 | Discharge: 2023-03-29 | Disposition: A | Discharge status: Home / Self Care | Payer: Medicaid Other | Attending: Physician Assistant | Admitting: Physician Assistant

## 2023-03-29 DIAGNOSIS — N898 Other specified noninflammatory disorders of vagina: Secondary | ICD-10-CM | POA: Diagnosis not present

## 2023-03-29 MED ORDER — METRONIDAZOLE 500 MG PO TABS: 500.0000 mg | ORAL_TABLET | Freq: Two times a day (BID) | ORAL | 0 refills | Status: DC

## 2023-03-29 NOTE — Discharge Instructions (Addendum)
We are treating you for bacterial vaginosis.  Take metronidazole twice daily for 7 days.  Do not drink any alcohol while on this medication as will cause you to vomit.  Wear loosefitting cotton underwear and use hypoallergenic soaps and detergents.  We will contact you if any to arrange additional treatment based on your swab results.  If anything changes or worsens you develop pelvic pain, abdominal pain, fever, nausea, vomiting you need to be seen immediately.

## 2023-03-29 NOTE — ED Provider Notes (Signed)
[facility]    CSN: [id number] Arrival date & time: 03/29/23  0845      History   Chief Complaint Chief Complaint  Patient presents with   Vaginal Discharge   Appointment    HPI Beverly Marquez is a 32 y.o. female.   Patient presents today with a 1 day history of vaginal odor and discharge.  She has history of recurrent bacterial vaginosis and was last treated Feb 26, 2023.  Reports that she generally gets this approximately once a month with similar presentation.  She does have an OB/GYN but has never discussed management of recurrent bacterial vaginosis.  She has not tried boric acid suppositories in the past.  She does report using a new soap but generally does not use around the vagina.  Denies any additional changes to her personal hygiene products including detergents.  Denies any recent medication changes.  She denies history of diabetes or immunosuppression.  Does not take SGLT2 inhibitor.  Denies any associated fever, abdominal pain, pelvic pain, urinary symptoms, nausea, vomiting.  She is confident that she is not pregnant.    Past Medical History:  Diagnosis Date   Herpes    Trichomonas     Patient Active Problem List   Diagnosis Date Noted   Vaginal discharge 01/30/2023   Concern about sexually transmitted disease in female without diagnosis 01/30/2023   Supervision of other normal pregnancy, antepartum 11/09/2020   Contraception 09/12/2016    Past Surgical History:  Procedure Laterality Date   NO PAST SURGERIES      OB History     Gravida  7   Para  3   Term  3   Preterm  0   AB  4   Living  3      SAB  1   IAB  2   Ectopic  0   Multiple      Live Births  3            Home Medications    Prior to Admission medications   Medication Sig Start Date End Date Taking? Authorizing Provider  metroNIDAZOLE (FLAGYL) 500 MG tablet Take 1 tablet (500 mg total) by mouth 2 (two) times daily. 03/29/23   Stelios Kirby, Noberto Retort, PA-C     Family History Family History  Problem Relation Age of Onset   Healthy Father    Hypertension Mother    Breast cancer Mother     Social History Social History   Tobacco Use   Smoking status: Former    Types: Cigarettes   Smokeless tobacco: Never  Vaping Use   Vaping Use: Every day  Substance Use Topics   Alcohol use: Not Currently   Drug use: Yes    Types: Marijuana     Allergies   Patient has no known allergies.   Review of Systems Review of Systems  Constitutional:  Positive for activity change. Negative for appetite change, fatigue and fever.  Gastrointestinal:  Negative for abdominal pain, diarrhea, nausea and vomiting.  Genitourinary:  Positive for vaginal discharge. Negative for dysuria, frequency, pelvic pain, urgency, vaginal bleeding and vaginal pain.     Physical Exam Triage Vital Signs ED Triage Vitals  Enc Vitals Group     BP 03/29/23 0858 120/64     Pulse Rate 03/29/23 0858 91     Resp 03/29/23 0858 18     Temp 03/29/23 0858 98.6 F (37 C)     Temp Source 03/29/23 0858 Oral  SpO2 03/29/23 0858 97 %     Weight 03/29/23 0858 147 lb (66.7 kg)     Height 03/29/23 0858 5\' 4"  (1.626 m)     Head Circumference --      Peak Flow --      Pain Score 03/29/23 0857 0     Pain Loc --      Pain Edu? --      Excl. in GC? --    No data found.  Updated Vital Signs BP 120/64 (BP Location: Right Arm)   Pulse 91   Temp 98.6 F (37 C) (Oral)   Resp 18   Ht 5\' 4"  (1.626 m)   Wt 147 lb (66.7 kg)   LMP 03/24/2023 (Approximate)   SpO2 97%   BMI 25.23 kg/m   Visual Acuity Right Eye Distance:   Left Eye Distance:   Bilateral Distance:    Right Eye Near:   Left Eye Near:    Bilateral Near:     Physical Exam Vitals reviewed.  Constitutional:      General: She is awake. She is not in acute distress.    Appearance: Normal appearance. She is well-developed. She is not ill-appearing.     Comments: Very pleasant female appears stated age in no  acute distress sitting comfortably in exam room  HENT:     Head: Normocephalic and atraumatic.  Cardiovascular:     Rate and Rhythm: Normal rate and regular rhythm.     Heart sounds: Normal heart sounds, S1 normal and S2 normal. No murmur heard. Pulmonary:     Effort: Pulmonary effort is normal.     Breath sounds: Normal breath sounds. No wheezing, rhonchi or rales.     Comments: Clear to auscultation bilaterally Abdominal:     General: Bowel sounds are normal.     Palpations: Abdomen is soft.     Tenderness: There is no abdominal tenderness. There is no right CVA tenderness, left CVA tenderness, guarding or rebound.     Comments: Benign abdominal exam  Genitourinary:    Comments: Exam deferred Psychiatric:        Behavior: Behavior is cooperative.      UC Treatments / Results  Labs (all labs ordered are listed, but only abnormal results are displayed) Labs Reviewed  CERVICOVAGINAL ANCILLARY ONLY    EKG   Radiology No results found.  Procedures Procedures (including critical care time)  Medications Ordered in UC Medications - No data to display  Initial Impression / Assessment and Plan / UC Course  I have reviewed the triage vital signs and the nursing notes.  Pertinent labs & imaging results that were available during my care of the patient were reviewed by me and considered in my medical decision making (see chart for details).     Patient is well-appearing, afebrile, nontoxic, nontachycardic.  Concern for recurrent bacterial vaginosis given clinical presentation.  STI swab was collected and is pending.  Will empirically treat for BV and contact her if any to arrange additional treatment based on her swab results.  Metronidazole sent to pharmacy and she was instructed not to drink any alcohol with this medication due to associated Antabuse side effects.  She is to wear loosefitting cotton underwear and use hypoallergenic soaps and detergents.  We did discuss  potential utility of boric acid suppositories to help manage her current symptoms but encouraged her to follow-up with her OB/GYN for additional evaluation and management.  If she has any worsening or changing symptoms  she is to return for reevaluation.  Strict return precautions given.  Final Clinical Impressions(s) / UC Diagnoses   Final diagnoses:  Vaginal odor  Vaginal discharge     Discharge Instructions      We are treating you for bacterial vaginosis.  Take metronidazole twice daily for 7 days.  Do not drink any alcohol while on this medication as will cause you to vomit.  Wear loosefitting cotton underwear and use hypoallergenic soaps and detergents.  We will contact you if any to arrange additional treatment based on your swab results.  If anything changes or worsens you develop pelvic pain, abdominal pain, fever, nausea, vomiting you need to be seen immediately.    ED Prescriptions     Medication Sig Dispense Auth. Provider   metroNIDAZOLE (FLAGYL) 500 MG tablet Take 1 tablet (500 mg total) by mouth 2 (two) times daily. 14 tablet Siera Beyersdorf, Noberto Retort, PA-C      PDMP not reviewed this encounter.   Jeani Hawking, PA-C 03/29/23 0930

## 2023-03-29 NOTE — ED Triage Notes (Signed)
Vaginal discharge and odors for one day.

## 2023-04-01 LAB — CERVICOVAGINAL ANCILLARY ONLY
Bacterial Vaginitis (gardnerella): POSITIVE — AB
Candida Glabrata: NEGATIVE
Candida Vaginitis: NEGATIVE
Chlamydia: NEGATIVE
Comment: NEGATIVE
Comment: NEGATIVE
Comment: NEGATIVE
Comment: NEGATIVE
Comment: NEGATIVE
Comment: NORMAL
Neisseria Gonorrhea: NEGATIVE
Trichomonas: NEGATIVE

## 2023-04-26 ENCOUNTER — Ambulatory Visit (HOSPITAL_COMMUNITY): Payer: Self-pay

## 2023-04-30 ENCOUNTER — Ambulatory Visit (HOSPITAL_COMMUNITY): Payer: Self-pay

## 2023-06-06 ENCOUNTER — Ambulatory Visit (HOSPITAL_COMMUNITY): Payer: Self-pay

## 2023-06-27 ENCOUNTER — Encounter (HOSPITAL_COMMUNITY): Payer: Self-pay

## 2023-06-27 ENCOUNTER — Ambulatory Visit (HOSPITAL_COMMUNITY)
Admission: RE | Admit: 2023-06-27 | Discharge: 2023-06-27 | Disposition: A | Discharge status: Home / Self Care | Payer: Medicaid Other | Source: Ambulatory Visit | Attending: Emergency Medicine | Admitting: Emergency Medicine

## 2023-06-27 VITALS — BP 115/75 | HR 74 | Temp 98.0°F | Resp 16 | Ht 65.0 in | Wt 148.0 lb

## 2023-06-27 DIAGNOSIS — N760 Acute vaginitis: Secondary | ICD-10-CM | POA: Diagnosis not present

## 2023-06-27 DIAGNOSIS — N898 Other specified noninflammatory disorders of vagina: Secondary | ICD-10-CM | POA: Diagnosis not present

## 2023-06-27 DIAGNOSIS — B9689 Other specified bacterial agents as the cause of diseases classified elsewhere: Secondary | ICD-10-CM | POA: Diagnosis not present

## 2023-06-27 MED ORDER — METRONIDAZOLE 500 MG PO TABS: 500.0000 mg | ORAL_TABLET | Freq: Two times a day (BID) | ORAL | 0 refills | Status: AC

## 2023-06-27 NOTE — ED Provider Notes (Signed)
[facility]    CSN: [id number] Arrival date & time: 06/27/23  0844      History   Chief Complaint Chief Complaint  Patient presents with   Vaginal Odor    HPI Beverly Marquez is a 32 y.o. female.  Here with vaginal odor that began yesterday after her menstrual cycle ended. Reports fishy smell Denies discharge, itching, rash, dysuria, abdominal pain She reports history of BV with similar odor. No known STD exposure but would still like testing   Past Medical History:  Diagnosis Date   Herpes    Trichomonas     Patient Active Problem List   Diagnosis Date Noted   Vaginal discharge 01/30/2023   Concern about sexually transmitted disease in female without diagnosis 01/30/2023   Supervision of other normal pregnancy, antepartum 11/09/2020   Contraception 09/12/2016    Past Surgical History:  Procedure Laterality Date   NO PAST SURGERIES      OB History     Gravida  7   Para  3   Term  3   Preterm  0   AB  4   Living  3      SAB  1   IAB  2   Ectopic  0   Multiple      Live Births  3            Home Medications    Prior to Admission medications   Medication Sig Start Date End Date Taking? Authorizing Provider  metroNIDAZOLE (FLAGYL) 500 MG tablet Take 1 tablet (500 mg total) by mouth 2 (two) times daily for 7 days. 06/27/23 07/04/23 Yes Kansas Spainhower, Lurena Joiner, PA-C    Family History Family History  Problem Relation Age of Onset   Healthy Father    Hypertension Mother    Breast cancer Mother     Social History Social History   Tobacco Use   Smoking status: Former    Types: Cigarettes   Smokeless tobacco: Never  Vaping Use   Vaping status: Former  Substance Use Topics   Alcohol use: Yes    Comment: daily   Drug use: Yes    Types: Marijuana     Allergies   Patient has no known allergies.   Review of Systems Review of Systems As per HPI  Physical Exam Triage Vital Signs ED Triage Vitals [06/27/23 0906]   Encounter Vitals Group     BP 115/75     Systolic BP Percentile      Diastolic BP Percentile      Pulse Rate 74     Resp 16     Temp 98 F (36.7 C)     Temp Source Oral     SpO2 97 %     Weight 148 lb (67.1 kg)     Height 5\' 5"  (1.651 m)     Head Circumference      Peak Flow      Pain Score 0     Pain Loc      Pain Education      Exclude from Growth Chart    No data found.  Updated Vital Signs BP 115/75 (BP Location: Left Arm)   Pulse 74   Temp 98 F (36.7 C) (Oral)   Resp 16   Ht 5\' 5"  (1.651 m)   Wt 148 lb (67.1 kg)   LMP 06/23/2023 (Exact Date)   SpO2 97%   BMI 24.63 kg/m   Physical Exam Vitals and nursing  note reviewed.  Constitutional:      General: She is not in acute distress.    Appearance: Normal appearance.  Cardiovascular:     Rate and Rhythm: Normal rate and regular rhythm.     Heart sounds: Normal heart sounds.  Pulmonary:     Effort: Pulmonary effort is normal.     Breath sounds: Normal breath sounds.  Neurological:     Mental Status: She is alert and oriented to person, place, and time.      UC Treatments / Results  Labs (all labs ordered are listed, but only abnormal results are displayed) Labs Reviewed  CERVICOVAGINAL ANCILLARY ONLY    EKG   Radiology No results found.  Procedures Procedures (including critical care time)  Medications Ordered in UC Medications - No data to display  Initial Impression / Assessment and Plan / UC Course  I have reviewed the triage vital signs and the nursing notes.  Pertinent labs & imaging results that were available during my care of the patient were reviewed by me and considered in my medical decision making (see chart for details).  Cytology swab pending Treat for BV with Flagyl twice daily for 7 days.  Discussed side effects and precautions.  Patient is agreeable to plan, no questions at this time  Final Clinical Impressions(s) / UC Diagnoses   Final diagnoses:  Vaginal odor  BV  (bacterial vaginosis)     Discharge Instructions      Please take medication as prescribed. Take with food to avoid upset stomach. Do NOT drink alcohol while on this medication, and for 3-4 days after finishing the pills.  We will call you if anything on your swab returns positive. You can also see these results on MyChart. Please abstain from sexual intercourse until your results return.    ED Prescriptions     Medication Sig Dispense Auth. Provider   metroNIDAZOLE (FLAGYL) 500 MG tablet Take 1 tablet (500 mg total) by mouth 2 (two) times daily for 7 days. 14 tablet Markan Cazarez, Lurena Joiner, PA-C      PDMP not reviewed this encounter.   Marlow Baars, [place] 06/27/23 [phone]

## 2023-06-27 NOTE — ED Triage Notes (Signed)
Patient here today with c/o vaginal odor and discharge that started yesterday. LMP ended yesterday.

## 2023-06-27 NOTE — Discharge Instructions (Addendum)
Please take medication as prescribed. Take with food to avoid upset stomach. Do NOT drink alcohol while on this medication, and for 3-4 days after finishing the pills.  We will call you if anything on your swab returns positive. You can also see these results on MyChart. Please abstain from sexual intercourse until your results return.

## 2023-06-28 LAB — CERVICOVAGINAL ANCILLARY ONLY
Bacterial Vaginitis (gardnerella): POSITIVE — AB
Candida Glabrata: NEGATIVE
Candida Vaginitis: NEGATIVE
Chlamydia: NEGATIVE
Comment: NEGATIVE
Comment: NEGATIVE
Comment: NEGATIVE
Comment: NEGATIVE
Comment: NEGATIVE
Comment: NORMAL
Neisseria Gonorrhea: NEGATIVE
Trichomonas: NEGATIVE

## 2023-07-30 ENCOUNTER — Ambulatory Visit: Payer: Medicaid Other

## 2023-07-30 VITALS — BP 106/63 | HR 93 | Wt 148.5 lb

## 2023-07-30 DIAGNOSIS — Z3201 Encounter for pregnancy test, result positive: Secondary | ICD-10-CM | POA: Diagnosis not present

## 2023-07-30 DIAGNOSIS — Z3200 Encounter for pregnancy test, result unknown: Secondary | ICD-10-CM

## 2023-07-30 LAB — POCT URINE PREGNANCY: Preg Test, Ur: POSITIVE — AB

## 2023-07-30 MED ORDER — PRENATE MINI 18-0.6-0.4-350 MG PO CAPS: 1.0000 | ORAL_CAPSULE | Freq: Every day | ORAL | 11 refills | Status: DC

## 2023-07-30 NOTE — Progress Notes (Signed)
..  Ms. Worst presents today for UPT. She has no unusual complaints. LMP: 06/23/2023    OBJECTIVE: Appears well, in no apparent distress.  OB History     Gravida  8   Para  3   Term  3   Preterm  0   AB  4   Living  3      SAB  1   IAB  2   Ectopic  0   Multiple      Live Births  3          Home UPT Result: Positive In-Office UPT result: Positive I have reviewed the patient's medical, obstetrical, social, and family histories, and medications.   ASSESSMENT: Positive pregnancy test  PLAN Prenatal care to be completed at: [facility]

## 2023-09-03 ENCOUNTER — Inpatient Hospital Stay (HOSPITAL_COMMUNITY)
Admission: AD | Admit: 2023-09-03 | Discharge: 2023-09-03 | Discharge status: Against Medical Advice | Payer: Medicaid Other | Attending: Obstetrics and Gynecology | Admitting: Obstetrics and Gynecology

## 2023-09-03 DIAGNOSIS — R109 Unspecified abdominal pain: Secondary | ICD-10-CM | POA: Diagnosis not present

## 2023-09-03 DIAGNOSIS — Z5321 Procedure and treatment not carried out due to patient leaving prior to being seen by health care provider: Secondary | ICD-10-CM | POA: Insufficient documentation

## 2023-09-03 NOTE — MAU Note (Signed)
Patient stated she did not want to wait any longer. Signed AMA form. Placed in chart.

## 2023-09-03 NOTE — MAU Note (Signed)
Patient came to nurses station to inquire about when she would be seen and that she did not want to wait any longer. RN informed patient that provider was with another patient and would get to her as soon as they could. Patient reports that she needs to know if she should take any medication and that she wants to leave. RN informed patient that she could opt to wait for the provider to see her or she could sign an AMA form. Patient opted for AMA form and put her name, but did not sign her signature. Georgina Snell, RN and Clydene Laming, RN witnessed patient's verbal understanding to leave AMA.

## 2023-09-03 NOTE — MAU Note (Signed)
.  Beverly Marquez is a 32 y.o. at [redacted]w[redacted]d here in MAU reporting: She reports she does not desire to have an abortion anymore. She reports "I want my baby." She reports she took the first two pills that dissolved in her cheek around 1000 today. She reports they informed her to come here with the hopes of taking a medication to reverse the process. She reports slight lower abdominal cramping that occured one hour ago.   Onset of complaint: today Pain score: Denies current pain.  Vitals:   09/03/23 1349  BP: (!) 114/56  Pulse: 86  Resp: 16  Temp: 98.6 F (37 C)  SpO2: 100%      FHT: 171 doppler Lab orders placed from triage: none

## 2023-11-13 ENCOUNTER — Encounter: Payer: Self-pay | Admitting: Obstetrics

## 2023-11-13 ENCOUNTER — Ambulatory Visit: Payer: Medicaid Other | Admitting: Obstetrics

## 2023-11-13 VITALS — BP 112/75 | HR 62 | Wt 148.7 lb

## 2023-11-13 DIAGNOSIS — N760 Acute vaginitis: Secondary | ICD-10-CM

## 2023-11-13 DIAGNOSIS — B9689 Other specified bacterial agents as the cause of diseases classified elsewhere: Secondary | ICD-10-CM | POA: Diagnosis not present

## 2023-11-13 DIAGNOSIS — N898 Other specified noninflammatory disorders of vagina: Secondary | ICD-10-CM

## 2023-11-13 MED ORDER — METRONIDAZOLE 0.75 % VA GEL
1.0000 | Freq: Two times a day (BID) | VAGINAL | 6 refills | Status: DC
Start: 2023-11-13 — End: 2024-06-23

## 2023-11-13 NOTE — Progress Notes (Signed)
Patient ID: Beverly Marquez, female   DOB: 1991-02-15, 33 y.o.   MRN: [id number]  Chief Complaint  Patient presents with   GYN    HPI Beverly Marquez is a 33 y.o. female.  Vaginal dryness and recurrent BV. HPI  Past Medical History:  Diagnosis Date   Herpes    Trichomonas     Past Surgical History:  Procedure Laterality Date   NO PAST SURGERIES      Family History  Problem Relation Age of Onset   Healthy Father    Hypertension Mother    Breast cancer Mother     Social History Social History   Tobacco Use   Smoking status: Former    Types: Cigarettes   Smokeless tobacco: Never  Vaping Use   Vaping status: Former  Substance Use Topics   Alcohol use: Yes    Comment: daily   Drug use: Yes    Types: Marijuana    No Known Allergies  Current Outpatient Medications  Medication Sig Dispense Refill   lactobacillus acidophilus (BACID) TABS tablet Take 2 tablets by mouth 3 (three) times daily.     metroNIDAZOLE (METROGEL) 0.75 % vaginal gel Place 1 Applicatorful vaginally 2 (two) times daily. Use after the period ends, monthly, for 6 months. 70 g 6   Prenat-FeCbn-FeAsp-Meth-FA-DHA (PRENATE MINI) 18-0.6-0.4-350 MG CAPS Take 1 tablet by mouth daily. (Patient not taking: Reported on 11/13/2023) 30 capsule 11   No current facility-administered medications for this visit.    Review of Systems Review of Systems Constitutional: negative for fatigue and weight loss Respiratory: negative for cough and wheezing Cardiovascular: negative for chest pain, fatigue and palpitations Gastrointestinal: negative for abdominal pain and change in bowel habits Genitourinary: positive for vaginal dryness and recurrent vaginal discharge with odor Integument/breast: negative for nipple discharge Musculoskeletal:negative for myalgias Neurological: negative for gait problems and tremors Behavioral/Psych: negative for abusive relationship, depression Endocrine: negative for temperature  intolerance      Blood pressure 112/75, pulse 62, weight 148 lb 11.2 oz (67.4 kg), last menstrual period 10/25/2023, unknown if currently breastfeeding.  Physical Exam Physical Exam General:   Alert and no distress  Skin:   no rash or abnormalities  Lungs:   clear to auscultation bilaterally  Heart:   regular rate and rhythm, S1, S2 normal, no murmur, click, rub or gallop  Pelvic exam omitted.  Consult only.  I have spent a total of 15 minutes of face-to-face time, excluding clinical staff time, reviewing notes and preparing to see patient, ordering tests and/or medications, and counseling the patient.   Data Reviewed Wet Prep  Assessment     1. Vaginal dryness (Primary) - discussed etiologies and suggested remedies - lubricants and probiotics recommended  2. BV (bacterial vaginosis), recurrent Rx: - metroNIDAZOLE (METROGEL) 0.75 % vaginal gel; Place 1 Applicatorful vaginally 2 (two) times daily. Use after the period ends, monthly, for 6 months.  Dispense: 70 g; Refill: 6     Plan   Follow up in 3 months  Meds ordered this encounter  Medications   metroNIDAZOLE (METROGEL) 0.75 % vaginal gel    Sig: Place 1 Applicatorful vaginally 2 (two) times daily. Use after the period ends, monthly, for 6 months.    Dispense:  70 g    Refill:  6     Brock Bad, MD, FACOG Attending Obstetrician & Gynecologist, [facility] for [facility], [facility], [place] 11/13/2023

## 2023-11-13 NOTE — Progress Notes (Signed)
Pt is in the office complaining of vaginal dryness that occurs intermittently during intercourse.  Pt was seen 12/27/22 for the same issue and started probiotics but reports issue is the same.  LMP 10/25/23 Pt is not on BC.

## 2023-12-10 ENCOUNTER — Ambulatory Visit
Admission: RE | Admit: 2023-12-10 | Discharge: 2023-12-10 | Disposition: A | Discharge status: Home / Self Care | Payer: Medicaid Other | Source: Ambulatory Visit | Attending: Family Medicine | Admitting: Family Medicine

## 2023-12-10 VITALS — BP 93/61 | HR 79 | Temp 98.1°F | Resp 16

## 2023-12-10 DIAGNOSIS — N760 Acute vaginitis: Secondary | ICD-10-CM | POA: Insufficient documentation

## 2023-12-10 LAB — POCT URINE PREGNANCY: Preg Test, Ur: NEGATIVE

## 2023-12-10 MED ORDER — FLUCONAZOLE 150 MG PO TABS: 150.0000 mg | ORAL_TABLET | ORAL | 0 refills | Status: DC

## 2023-12-10 NOTE — ED Triage Notes (Signed)
 Pt c/o vaginal d/c x today-NAD-steady gait

## 2023-12-10 NOTE — Discharge Instructions (Signed)
 Start fluconazole to address suspected yeast infection.  Will update you with your test results tomorrow.

## 2023-12-10 NOTE — ED Provider Notes (Signed)
 [facility]  Note:  This document was prepared using Conservation officer, historic buildings and may include unintentional dictation errors.  MRN: [id number] DOB: [birth year]  Subjective:   Beverly Marquez is a 33 y.o. female presenting for acute onset today of vaginal discharge that is thick and cottage like per patient.  She also had a slight itch.  Would like to be covered for yeast infection.  No urinary symptoms.  Wants a pregnancy test.  Has a history of recurring BV and yeast infection.  No current facility-administered medications for this encounter.  Current Outpatient Medications:    lactobacillus acidophilus (BACID) TABS tablet, Take 2 tablets by mouth 3 (three) times daily., Disp: , Rfl:    metroNIDAZOLE (METROGEL) 0.75 % vaginal gel, Place 1 Applicatorful vaginally 2 (two) times daily. Use after the period ends, monthly, for 6 months., Disp: 70 g, Rfl: 6   Prenat-FeCbn-FeAsp-Meth-FA-DHA (PRENATE MINI) 18-0.6-0.4-350 MG CAPS, Take 1 tablet by mouth daily. (Patient not taking: Reported on 11/13/2023), Disp: 30 capsule, Rfl: 11   No Known Allergies  Past Medical History:  Diagnosis Date   Herpes    Trichomonas      Past Surgical History:  Procedure Laterality Date   NO PAST SURGERIES      Family History  Problem Relation Age of Onset   Healthy Father    Hypertension Mother    Breast cancer Mother     Social History   Tobacco Use   Smoking status: Former    Types: Cigarettes   Smokeless tobacco: Never  Vaping Use   Vaping status: Former  Substance Use Topics   Alcohol use: Yes    Comment: daily   Drug use: Yes    Types: Marijuana    ROS   Objective:   Vitals: BP 93/61 (BP Location: Right Arm)   Pulse 79   Temp 98.1 F (36.7 C) (Oral)   Resp 16   LMP 11/17/2023 (Exact Date)   SpO2 98%   Physical Exam Constitutional:      General: She is not in acute distress.    Appearance: Normal appearance. She is well-developed. She  is not ill-appearing, toxic-appearing or diaphoretic.  HENT:     Head: Normocephalic and atraumatic.     Nose: Nose normal.     Mouth/Throat:     Mouth: Mucous membranes are moist.  Eyes:     General: No scleral icterus.       Right eye: No discharge.        Left eye: No discharge.     Extraocular Movements: Extraocular movements intact.  Cardiovascular:     Rate and Rhythm: Normal rate.  Pulmonary:     Effort: Pulmonary effort is normal.  Skin:    General: Skin is warm and dry.  Neurological:     General: No focal deficit present.     Mental Status: She is alert and oriented to person, place, and time.  Psychiatric:        Mood and Affect: Mood normal.        Behavior: Behavior normal.     Results for orders placed or performed during the hospital encounter of 12/10/23 (from the past 24 hours)  POCT urine pregnancy     Status: Normal   Collection Time: 12/10/23  4:00 PM  Result Value Ref Range   Preg Test, Ur Negative     Assessment and Plan :   PDMP not reviewed this encounter.  1. Acute  vaginitis    Would like coverage for yeast infection only.  Labs pending, will treat as appropriate otherwise.   Wallis Bamberg, [place] 12/10/23 [phone]

## 2023-12-11 LAB — CERVICOVAGINAL ANCILLARY ONLY
Bacterial Vaginitis (gardnerella): POSITIVE — AB
Candida Glabrata: NEGATIVE
Candida Vaginitis: POSITIVE — AB
Chlamydia: NEGATIVE
Comment: NEGATIVE
Comment: NEGATIVE
Comment: NEGATIVE
Comment: NEGATIVE
Comment: NEGATIVE
Comment: NORMAL
Neisseria Gonorrhea: NEGATIVE
Trichomonas: NEGATIVE

## 2023-12-12 ENCOUNTER — Telehealth (HOSPITAL_COMMUNITY): Payer: Self-pay

## 2023-12-12 MED ORDER — METRONIDAZOLE 0.75 % VA GEL: 1.0000 | Freq: Every day | VAGINAL | 0 refills | Status: AC

## 2023-12-12 NOTE — Telephone Encounter (Signed)
 Per protocol, pt requires tx with metronidazole. Rx sent to pharmacy on file.

## 2024-02-13 ENCOUNTER — Telehealth: Payer: Self-pay

## 2024-02-13 NOTE — Telephone Encounter (Signed)
 Returned call, pt requesting refill on metrogel , advised pt that last provider note on 11/13/23 states to follow up in 3 months. Pt states that she was unaware, offered nurse visit and/or provider visit, pt declined and stated that she would go to urgent care.

## 2024-02-15 ENCOUNTER — Ambulatory Visit (HOSPITAL_COMMUNITY): Payer: Self-pay

## 2024-03-07 ENCOUNTER — Ambulatory Visit (HOSPITAL_COMMUNITY): Payer: Self-pay

## 2024-04-07 ENCOUNTER — Ambulatory Visit (HOSPITAL_COMMUNITY): Payer: Self-pay

## 2024-05-18 DIAGNOSIS — H5213 Myopia, bilateral: Secondary | ICD-10-CM | POA: Diagnosis not present

## 2024-06-11 ENCOUNTER — Ambulatory Visit: Admitting: Certified Nurse Midwife

## 2024-06-11 DIAGNOSIS — Z3009 Encounter for other general counseling and advice on contraception: Secondary | ICD-10-CM

## 2024-06-11 DIAGNOSIS — Z01419 Encounter for gynecological examination (general) (routine) without abnormal findings: Secondary | ICD-10-CM

## 2024-06-11 DIAGNOSIS — Z124 Encounter for screening for malignant neoplasm of cervix: Secondary | ICD-10-CM

## 2024-06-22 ENCOUNTER — Encounter (HOSPITAL_COMMUNITY): Payer: Self-pay

## 2024-06-22 ENCOUNTER — Ambulatory Visit (HOSPITAL_COMMUNITY)
Admission: RE | Admit: 2024-06-22 | Discharge: 2024-06-22 | Disposition: A | Discharge status: Home / Self Care | Source: Ambulatory Visit | Attending: Internal Medicine | Admitting: Internal Medicine

## 2024-06-22 VITALS — BP 107/76 | HR 82 | Temp 98.4°F | Resp 14

## 2024-06-22 DIAGNOSIS — N898 Other specified noninflammatory disorders of vagina: Secondary | ICD-10-CM | POA: Insufficient documentation

## 2024-06-22 MED ORDER — METRONIDAZOLE 1 % EX GEL
Freq: Every day | CUTANEOUS | 1 refills | Status: DC
Start: 2024-06-22 — End: 2024-07-21

## 2024-06-22 NOTE — ED Triage Notes (Signed)
 Pt states she had some vaginal discharge this morning and wants to get tested. She only wants cyto

## 2024-06-22 NOTE — ED Provider Notes (Signed)
 [facility]    CSN: [id number] Arrival date & time: 06/22/24  1833      History   Chief Complaint Chief Complaint  Patient presents with   Vaginal Discharge    Entered by patient   SEXUALLY TRANSMITTED DISEASE    HPI Beverly Marquez is a 33 y.o. female.   33 year old female presents urgent care with complaints of vaginal discharge and odor.  She reports this started today.  She has had this multiple times in the past and knows that this is what she gets when she has BV.  She would like to have STI testing done as well.  She request MetroGel  versus metronidazole  pills.  She denies any dysuria, hematuria, nausea, vomiting or other associated symptoms.   Vaginal Discharge Associated symptoms: no abdominal pain, no dysuria, no fever and no vomiting     Past Medical History:  Diagnosis Date   Herpes    Trichomonas     Patient Active Problem List   Diagnosis Date Noted   Vaginal discharge 01/30/2023   Concern about sexually transmitted disease in female without diagnosis 01/30/2023   Supervision of other normal pregnancy, antepartum 11/09/2020   Contraception 09/12/2016    Past Surgical History:  Procedure Laterality Date   NO PAST SURGERIES      OB History     Gravida  9   Para  3   Term  3   Preterm  0   AB  5   Living  3      SAB  1   IAB  3   Ectopic  0   Multiple      Live Births  3            Home Medications    Prior to Admission medications   Medication Sig Start Date End Date Taking? Authorizing Provider  metroNIDAZOLE  (METROGEL ) 1 % gel Apply topically daily. For 7 days 06/22/24  Yes Jamarii Banks A, PA-C  fluconazole  (DIFLUCAN ) 150 MG tablet Take 1 tablet (150 mg total) by mouth every 3 (three) days. 12/10/23   Christopher Savannah, PA-C  lactobacillus acidophilus (BACID) TABS tablet Take 2 tablets by mouth 3 (three) times daily.    [provider]  metroNIDAZOLE  (METROGEL ) 0.75 % vaginal gel Place 1  Applicatorful vaginally 2 (two) times daily. Use after the period ends, monthly, for 6 months. 11/13/23   Rudy Carlin LABOR, MD  Prenat-FeCbn-FeAsp-Meth-FA-DHA (PRENATE MINI ) 18-0.6-0.4-350 MG CAPS Take 1 tablet by mouth daily. Patient not taking: Reported on 11/13/2023 07/30/23   Lorence Ozell CROME, MD    Family History Family History  Problem Relation Age of Onset   Healthy Father    Hypertension Mother    Breast cancer Mother     Social History Social History   Tobacco Use   Smoking status: Former    Types: Cigarettes   Smokeless tobacco: Never  Vaping Use   Vaping status: Former  Substance Use Topics   Alcohol use: Yes    Comment: daily   Drug use: Yes    Types: Marijuana     Allergies   Patient has no known allergies.   Review of Systems Review of Systems  Constitutional:  Negative for chills and fever.  HENT:  Negative for ear pain and sore throat.   Eyes:  Negative for pain and visual disturbance.  Respiratory:  Negative for cough and shortness of breath.   Cardiovascular:  Negative for chest pain and palpitations.  Gastrointestinal:  Negative for abdominal pain and vomiting.  Genitourinary:  Positive for vaginal discharge. Negative for dysuria and hematuria.  Musculoskeletal:  Negative for arthralgias and back pain.  Skin:  Negative for color change and rash.  Neurological:  Negative for seizures and syncope.  All other systems reviewed and are negative.    Physical Exam Triage Vital Signs ED Triage Vitals  Encounter Vitals Group     BP 06/22/24 1912 107/76     Girls Systolic BP Percentile --      Girls Diastolic BP Percentile --      Boys Systolic BP Percentile --      Boys Diastolic BP Percentile --      Pulse Rate 06/22/24 1912 82     Resp 06/22/24 1912 14     Temp 06/22/24 1912 98.4 F (36.9 C)     Temp Source 06/22/24 1912 Oral     SpO2 06/22/24 1912 96 %     Weight --      Height --      Head Circumference --      Peak Flow --      Pain  Score 06/22/24 1911 0     Pain Loc --      Pain Education --      Exclude from Growth Chart --    No data found.  Updated Vital Signs BP 107/76 (BP Location: Left Arm)   Pulse 82   Temp 98.4 F (36.9 C) (Oral)   Resp 14   LMP 06/13/2024 (Approximate)   SpO2 96%   Visual Acuity Right Eye Distance:   Left Eye Distance:   Bilateral Distance:    Right Eye Near:   Left Eye Near:    Bilateral Near:     Physical Exam Vitals and nursing note reviewed.  Constitutional:      General: She is not in acute distress.    Appearance: She is well-developed.  HENT:     Head: Normocephalic and atraumatic.  Eyes:     Conjunctiva/sclera: Conjunctivae normal.  Cardiovascular:     Rate and Rhythm: Normal rate and regular rhythm.     Heart sounds: No murmur heard. Pulmonary:     Effort: Pulmonary effort is normal. No respiratory distress.     Breath sounds: Normal breath sounds.  Abdominal:     Palpations: Abdomen is soft.     Tenderness: There is no abdominal tenderness.  Musculoskeletal:        General: No swelling.     Cervical back: Neck supple.  Skin:    General: Skin is warm and dry.     Capillary Refill: Capillary refill takes less than 2 seconds.  Neurological:     Mental Status: She is alert.  Psychiatric:        Mood and Affect: Mood normal.      UC Treatments / Results  Labs (all labs ordered are listed, but only abnormal results are displayed) Labs Reviewed  CERVICOVAGINAL ANCILLARY ONLY    EKG   Radiology No results found.  Procedures Procedures (including critical care time)  Medications Ordered in UC Medications - No data to display  Initial Impression / Assessment and Plan / UC Course  I have reviewed the triage vital signs and the nursing notes.  Pertinent labs & imaging results that were available during my care of the patient were reviewed by me and considered in my medical decision making (see chart for details).     Vaginal discharge  Metrogel  topical daily for 7 days.   Can consider using OTC boric acid suppositories to help with recurrent BV  Screening swab done today and results will be available in 24-48 hours. We will contact you if we need to arrange additional treatment based on your testing. Negative results will be on your MyChart account. Abstain from sex until you receive your final results.  Use a condom for sexual encounters. If you have any worsening or changing symptoms including abnormal discharge, pelvic pain, abdominal pain, fever, nausea, or vomiting, then you should be reevaluated.    Final Clinical Impressions(s) / UC Diagnoses   Final diagnoses:  Vaginal discharge     Discharge Instructions      Metrogel  topical daily for 7 days.   Can consider using OTC boric acid suppositories to help with recurrent BV  Screening swab done today and results will be available in 24-48 hours. We will contact you if we need to arrange additional treatment based on your testing. Negative results will be on your MyChart account. Abstain from sex until you receive your final results.  Use a condom for sexual encounters. If you have any worsening or changing symptoms including abnormal discharge, pelvic pain, abdominal pain, fever, nausea, or vomiting, then you should be reevaluated.      ED Prescriptions     Medication Sig Dispense Auth. Provider   metroNIDAZOLE  (METROGEL ) 1 % gel Apply topically daily. For 7 days 45 g Teresa Almarie LABOR, [place]      PDMP not reviewed this encounter.   Teresa Almarie LABOR, [place] 06/22/24 1943

## 2024-06-22 NOTE — Discharge Instructions (Addendum)
 Metrogel  topical daily for 7 days.   Can consider using OTC boric acid suppositories to help with recurrent BV  Screening swab done today and results will be available in 24-48 hours. We will contact you if we need to arrange additional treatment based on your testing. Negative results will be on your MyChart account. Abstain from sex until you receive your final results.  Use a condom for sexual encounters. If you have any worsening or changing symptoms including abnormal discharge, pelvic pain, abdominal pain, fever, nausea, or vomiting, then you should be reevaluated.

## 2024-06-23 ENCOUNTER — Ambulatory Visit (HOSPITAL_COMMUNITY): Payer: Self-pay

## 2024-06-23 ENCOUNTER — Telehealth (HOSPITAL_COMMUNITY): Payer: Self-pay | Admitting: *Deleted

## 2024-06-23 DIAGNOSIS — B9689 Other specified bacterial agents as the cause of diseases classified elsewhere: Secondary | ICD-10-CM

## 2024-06-23 LAB — CERVICOVAGINAL ANCILLARY ONLY
Bacterial Vaginitis (gardnerella): POSITIVE — AB
Candida Glabrata: NEGATIVE
Candida Vaginitis: NEGATIVE
Chlamydia: NEGATIVE
Comment: NEGATIVE
Comment: NEGATIVE
Comment: NEGATIVE
Comment: NEGATIVE
Comment: NEGATIVE
Comment: NORMAL
Neisseria Gonorrhea: NEGATIVE
Trichomonas: NEGATIVE

## 2024-06-23 MED ORDER — METRONIDAZOLE 0.75 % VA GEL
1.0000 | Freq: Two times a day (BID) | VAGINAL | 6 refills | Status: DC
Start: 2024-06-23 — End: 2024-07-21

## 2024-06-23 NOTE — Telephone Encounter (Signed)
 Pt called and the wrong metrogel  was called to pharmacy. I spoke with Dr Vonna and the 1% is for external use and the 0.75% is for internal use.   Advised pt rx has been changed and resent.

## 2024-07-19 DIAGNOSIS — T25321A Burn of third degree of right foot, initial encounter: Secondary | ICD-10-CM | POA: Diagnosis not present

## 2024-07-19 DIAGNOSIS — R531 Weakness: Secondary | ICD-10-CM | POA: Diagnosis not present

## 2024-07-19 DIAGNOSIS — Z23 Encounter for immunization: Secondary | ICD-10-CM | POA: Diagnosis not present

## 2024-07-19 DIAGNOSIS — T310 Burns involving less than 10% of body surface: Secondary | ICD-10-CM | POA: Diagnosis not present

## 2024-07-19 DIAGNOSIS — T25211A Burn of second degree of right ankle, initial encounter: Secondary | ICD-10-CM | POA: Diagnosis not present

## 2024-07-19 DIAGNOSIS — T25221A Burn of second degree of right foot, initial encounter: Secondary | ICD-10-CM | POA: Diagnosis not present

## 2024-07-19 NOTE — ED Notes (Signed)
 The appropriate Vaccine Information Sheet has been provided to the patient.     Vinie Anabel Patten, RN 07/19/24 [phone]
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
## 2024-07-21 ENCOUNTER — Encounter (HOSPITAL_COMMUNITY): Payer: Self-pay

## 2024-07-21 ENCOUNTER — Ambulatory Visit (HOSPITAL_COMMUNITY)
Admission: RE | Admit: 2024-07-21 | Discharge: 2024-07-21 | Disposition: A | Discharge status: Home / Self Care | Source: Ambulatory Visit

## 2024-07-21 VITALS — BP 130/76 | HR 89 | Temp 98.1°F | Resp 16

## 2024-07-21 DIAGNOSIS — T25221D Burn of second degree of right foot, subsequent encounter: Secondary | ICD-10-CM | POA: Diagnosis not present

## 2024-07-21 MED ORDER — KETOROLAC TROMETHAMINE 30 MG/ML IJ SOLN
30.0000 mg | Freq: Once | INTRAMUSCULAR | Status: AC
  Administered 2024-07-21: 30 mg via INTRAMUSCULAR

## 2024-07-21 MED ORDER — KETOROLAC TROMETHAMINE 30 MG/ML IJ SOLN
INTRAMUSCULAR | Status: AC
  Filled 2024-07-21: qty 1

## 2024-07-21 MED ORDER — SILVER SULFADIAZINE 1 % EX CREA
TOPICAL_CREAM | CUTANEOUS | Status: AC
  Filled 2024-07-21: qty 85

## 2024-07-21 MED ORDER — CEPHALEXIN 500 MG PO CAPS: 500.0000 mg | ORAL_CAPSULE | Freq: Four times a day (QID) | ORAL | 0 refills | Status: DC

## 2024-07-21 NOTE — Discharge Instructions (Addendum)
 Apply silvadene twice a day Take antibiotic as prescribed Call burn center tomorrow morning; [phone]  Can alternate Tylenol  and Ibuprofen  as needed
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
## 2024-07-21 NOTE — ED Provider Notes (Signed)
 [facility]    CSN: [id number] Arrival date & time: 07/21/24  1743      History   Chief Complaint Chief Complaint  Patient presents with   Burn    Entered by patient    HPI Beverly Marquez is a 33 y.o. female.   Patient presents with burn to her right foot that occurred 2 days ago.  She was seen in the ED yesterday.  She was prescribed hydrocortisone  that was filled 07/20/2024.  She has been applying bacitracin.  She reports increased pain since being discharged.  She was provided with contact information for the burn center but she has not called yet.  Denies fever, chills, nausea, vomiting    Past Medical History:  Diagnosis Date   Herpes    Trichomonas     Patient Active Problem List   Diagnosis Date Noted   Vaginal discharge 01/30/2023   Concern about sexually transmitted disease in female without diagnosis 01/30/2023   Supervision of other normal pregnancy, antepartum 11/09/2020   Contraception 09/12/2016    Past Surgical History:  Procedure Laterality Date   NO PAST SURGERIES      OB History     Gravida  9   Para  3   Term  3   Preterm  0   AB  5   Living  3      SAB  1   IAB  3   Ectopic  0   Multiple      Live Births  3            Home Medications    Prior to Admission medications   Medication Sig Start Date End Date Taking? Authorizing Provider  cephALEXin (KEFLEX) 500 MG capsule Take 1 capsule (500 mg total) by mouth 4 (four) times daily. 07/21/24  Yes Ward, Harlene PEDLAR, PA-C  valACYclovir  (VALTREX ) 500 MG tablet Take 500 mg by mouth as needed.    [provider]    Family History Family History  Problem Relation Age of Onset   Healthy Father    Hypertension Mother    Breast cancer Mother     Social History Social History   Tobacco Use   Smoking status: Former    Types: Cigars   Smokeless tobacco: Never  Vaping Use   Vaping status: Former  Substance Use Topics   Alcohol use: Yes     Comment: often   Drug use: Not Currently    Types: Marijuana     Allergies   Patient has no known allergies.   Review of Systems Review of Systems  Constitutional:  Negative for chills and fever.  HENT:  Negative for ear pain and sore throat.   Eyes:  Negative for pain and visual disturbance.  Respiratory:  Negative for cough and shortness of breath.   Cardiovascular:  Negative for chest pain and palpitations.  Gastrointestinal:  Negative for abdominal pain and vomiting.  Genitourinary:  Negative for dysuria and hematuria.  Musculoskeletal:  Negative for arthralgias and back pain.  Skin:  Positive for wound. Negative for color change and rash.  Neurological:  Negative for seizures and syncope.  All other systems reviewed and are negative.    Physical Exam Triage Vital Signs ED Triage Vitals  Encounter Vitals Group     BP 07/21/24 1828 130/76     Girls Systolic BP Percentile --      Girls Diastolic BP Percentile --      Boys Systolic BP  Percentile --      Boys Diastolic BP Percentile --      Pulse Rate 07/21/24 1828 89     Resp 07/21/24 1828 16     Temp 07/21/24 1828 98.1 F (36.7 C)     Temp Source 07/21/24 1828 Oral     SpO2 07/21/24 1828 96 %     Weight --      Height --      Head Circumference --      Peak Flow --      Pain Score 07/21/24 1831 10     Pain Loc --      Pain Education --      Exclude from Growth Chart --    No data found.  Updated Vital Signs BP 130/76 (BP Location: Left Arm)   Pulse 89   Temp 98.1 F (36.7 C) (Oral)   Resp 16   LMP 07/01/2024 (Approximate)   SpO2 96%   Breastfeeding No   Visual Acuity Right Eye Distance:   Left Eye Distance:   Bilateral Distance:    Right Eye Near:   Left Eye Near:    Bilateral Near:     Physical Exam Vitals and nursing note reviewed.  Constitutional:      General: She is not in acute distress.    Appearance: She is well-developed.  HENT:     Head: Normocephalic and atraumatic.  Eyes:      Conjunctiva/sclera: Conjunctivae normal.  Cardiovascular:     Rate and Rhythm: Normal rate and regular rhythm.     Heart sounds: No murmur heard. Pulmonary:     Effort: Pulmonary effort is normal. No respiratory distress.     Breath sounds: Normal breath sounds.  Abdominal:     Palpations: Abdomen is soft.     Tenderness: There is no abdominal tenderness.  Musculoskeletal:        General: No swelling.     Cervical back: Neck supple.  Skin:    General: Skin is warm and dry.     Capillary Refill: Capillary refill takes less than 2 seconds.     Comments: Partial-thickness burn covering most of the right foot, no swelling or drainage noted.   Neurological:     Mental Status: She is alert.  Psychiatric:        Mood and Affect: Mood normal.      UC Treatments / Results  Labs (all labs ordered are listed, but only abnormal results are displayed) Labs Reviewed - No data to display  EKG   Radiology No results found.  Procedures Procedures (including critical care time)  Medications Ordered in UC Medications  ketorolac (TORADOL) 30 MG/ML injection 30 mg (30 mg Intramuscular Given 07/21/24 1930)    Initial Impression / Assessment and Plan / UC Course  I have reviewed the triage vital signs and the nursing notes.  Pertinent labs & imaging results that were available during my care of the patient were reviewed by me and considered in my medical decision making (see chart for details).     Silvadene given in clinic today.  Wound dressed. Will star antibiotic.  Toradol given for pain, she still has norco.  Advised to contact burn center tomorrow. Return precautions discussed.  Final Clinical Impressions(s) / UC Diagnoses   Final diagnoses:  Partial thickness burn of right foot, subsequent encounter     Discharge Instructions      Apply silvadene twice a day Take antibiotic as prescribed Call burn center  tomorrow morning; [phone]  Can alternate Tylenol  and  Ibuprofen  as needed    ED Prescriptions     Medication Sig Dispense Auth. Provider   cephALEXin (KEFLEX) 500 MG capsule Take 1 capsule (500 mg total) by mouth 4 (four) times daily. 20 capsule Ward, Tyric Rodeheaver Z, PA-C      I have reviewed the PDMP during this encounter.   Ward, Harlene PEDLAR, PA-C 07/21/24 [phone]

## 2024-07-24 ENCOUNTER — Encounter (HOSPITAL_COMMUNITY): Payer: Self-pay

## 2024-07-24 ENCOUNTER — Ambulatory Visit (HOSPITAL_COMMUNITY)
Admission: RE | Admit: 2024-07-24 | Discharge: 2024-07-24 | Disposition: A | Discharge status: Home / Self Care | Payer: Self-pay | Source: Ambulatory Visit | Attending: Internal Medicine | Admitting: Internal Medicine

## 2024-07-24 VITALS — BP 106/65 | HR 90 | Temp 98.2°F | Resp 16

## 2024-07-24 DIAGNOSIS — T25221D Burn of second degree of right foot, subsequent encounter: Secondary | ICD-10-CM | POA: Diagnosis not present

## 2024-07-24 MED ORDER — SILVER SULFADIAZINE 1 % EX CREA
1.0000 | TOPICAL_CREAM | Freq: Two times a day (BID) | CUTANEOUS | 1 refills | Status: DC
Start: 2024-07-24 — End: 2024-08-29

## 2024-07-24 NOTE — ED Triage Notes (Signed)
 Patient had a burn to the right foot. Patient was seen in he ED on 10/5 and came to the UC on 10/7. Patient  states she was given Silvadene cream and is out of the cream. Patient is requesting a prescription for the same because she felt that it was helping.

## 2024-07-24 NOTE — ED Provider Notes (Signed)
 [facility]    CSN: [id number] Arrival date & time: 07/24/24  1545      History   Chief Complaint Chief Complaint  Patient presents with   Burn    Entered by patient    HPI Beverly Marquez is a 33 y.o. female.   33 year old female presents urgent care secondary to need for medication refill.  On October 5 she presented emergency room with a burn on her right foot.  She was given Silvadene twice daily.  She ran out of the cream and presented to urgent care on 10/7.  At that time she was given refill of the Silvadene as well as Keflex.  She has run out of the Silvadene again.  She has an appointment with the burn clinic but not till the 20th.  She reports that the area is very tender but the pain has not worsened.  She denies any fevers.  She is doing the dressing twice a day as directed.   Burn Associated symptoms: no cough, no eye pain and no shortness of breath     Past Medical History:  Diagnosis Date   Herpes    Trichomonas     Patient Active Problem List   Diagnosis Date Noted   Vaginal discharge 01/30/2023   Concern about sexually transmitted disease in female without diagnosis 01/30/2023   Supervision of other normal pregnancy, antepartum 11/09/2020   Contraception 09/12/2016    Past Surgical History:  Procedure Laterality Date   NO PAST SURGERIES      OB History     Gravida  9   Para  3   Term  3   Preterm  0   AB  5   Living  3      SAB  1   IAB  3   Ectopic  0   Multiple      Live Births  3            Home Medications    Prior to Admission medications   Medication Sig Start Date End Date Taking? Authorizing Provider  silver sulfADIAZINE (SILVADENE) 1 % cream Apply 1 Application topically 2 (two) times daily. 07/24/24  Yes Jontae Sonier A, PA-C  cephALEXin (KEFLEX) 500 MG capsule Take 1 capsule (500 mg total) by mouth 4 (four) times daily. 07/21/24   Ward, Harlene PEDLAR, PA-C  valACYclovir  (VALTREX ) 500 MG  tablet Take 500 mg by mouth as needed.    [provider]    Family History Family History  Problem Relation Age of Onset   Healthy Father    Hypertension Mother    Breast cancer Mother     Social History Social History   Tobacco Use   Smoking status: Former    Types: Cigars   Smokeless tobacco: Never  Vaping Use   Vaping status: Former  Substance Use Topics   Alcohol use: Yes    Comment: often   Drug use: Not Currently    Types: Marijuana     Allergies   Patient has no known allergies.   Review of Systems Review of Systems  Constitutional:  Negative for chills and fever.  HENT:  Negative for ear pain and sore throat.   Eyes:  Negative for pain and visual disturbance.  Respiratory:  Negative for cough and shortness of breath.   Cardiovascular:  Negative for chest pain and palpitations.  Gastrointestinal:  Negative for abdominal pain and vomiting.  Genitourinary:  Negative for dysuria and hematuria.  Musculoskeletal:  Negative for arthralgias and back pain.  Skin:  Positive for wound (Burn to right foot). Negative for color change and rash.  Neurological:  Negative for seizures and syncope.  All other systems reviewed and are negative.    Physical Exam Triage Vital Signs ED Triage Vitals  Encounter Vitals Group     BP 07/24/24 1607 106/65     Girls Systolic BP Percentile --      Girls Diastolic BP Percentile --      Boys Systolic BP Percentile --      Boys Diastolic BP Percentile --      Pulse Rate 07/24/24 1607 90     Resp 07/24/24 1607 16     Temp 07/24/24 1607 98.2 F (36.8 C)     Temp Source 07/24/24 1607 Oral     SpO2 07/24/24 1607 96 %     Weight --      Height --      Head Circumference --      Peak Flow --      Pain Score 07/24/24 1605 6     Pain Loc --      Pain Education --      Exclude from Growth Chart --    No data found.  Updated Vital Signs BP 106/65 (BP Location: Left Arm)   Pulse 90   Temp 98.2 F (36.8 C) (Oral)    Resp 16   LMP 07/01/2024 (Approximate)   SpO2 96%   Visual Acuity Right Eye Distance:   Left Eye Distance:   Bilateral Distance:    Right Eye Near:   Left Eye Near:    Bilateral Near:     Physical Exam Vitals and nursing note reviewed.  Constitutional:      General: She is not in acute distress.    Appearance: She is well-developed.  HENT:     Head: Normocephalic and atraumatic.  Eyes:     Conjunctiva/sclera: Conjunctivae normal.  Cardiovascular:     Rate and Rhythm: Normal rate.     Heart sounds: No murmur heard. Pulmonary:     Effort: Pulmonary effort is normal. No respiratory distress.  Musculoskeletal:        General: No swelling.  Feet:     Comments: Second degree burn to the right foot extending from the base of the toes along the lateral aspect of the foot up into the lateral malleolus and across the dorsum of the foot.  The skin overlying the ankle joint is involved.  No obvious tendon is exposed. Skin:    General: Skin is warm and dry.     Capillary Refill: Capillary refill takes less than 2 seconds.  Neurological:     Mental Status: She is alert.  Psychiatric:        Mood and Affect: Mood normal.      UC Treatments / Results  Labs (all labs ordered are listed, but only abnormal results are displayed) Labs Reviewed - No data to display  EKG   Radiology No results found.  Procedures Procedures (including critical care time)  Medications Ordered in UC Medications - No data to display  Initial Impression / Assessment and Plan / UC Course  I have reviewed the triage vital signs and the nursing notes.  Pertinent labs & imaging results that were available during my care of the patient were reviewed by me and considered in my medical decision making (see chart for details).     Partial thickness burn  of right foot, subsequent encounter   Burn to right foot is healing appropriately.  Continue to apply Silvadene, nonadhesive dressing and secure  with rolled gauze and Ace wrap twice daily.  Recommend light movement to help prevent contraction of the ankle.  Can call the burn clinic to see if an earlier appointment is available.  No signs of infection at this time.  Watch for signs of infection including fever, purulent drainage, significant increase in pain and worsening redness.  Clear Yellowish drainage is normal.  Return to urgent care as needed  Final Clinical Impressions(s) / UC Diagnoses   Final diagnoses:  Partial thickness burn of right foot, subsequent encounter     Discharge Instructions      Burn to right foot is healing appropriately.  Continue to apply Silvadene, nonadhesive dressing and secure with rolled gauze and Ace wrap twice daily.  Recommend light movement to help prevent contraction of the ankle.  Can call the burn clinic to see if an earlier appointment is available.  No signs of infection at this time.  Watch for signs of infection including fever, purulent drainage, significant increase in pain and worsening redness.  Clear Yellowish drainage is normal.  Return to urgent care as needed    ED Prescriptions     Medication Sig Dispense Auth. Provider   silver sulfADIAZINE (SILVADENE) 1 % cream Apply 1 Application topically 2 (two) times daily. 400 g Teresa Almarie LABOR, [place]      PDMP not reviewed this encounter.   Teresa Almarie LABOR, [place] 07/24/24 1639

## 2024-07-24 NOTE — Discharge Instructions (Addendum)
 Burn to right foot is healing appropriately.  Continue to apply Silvadene, nonadhesive dressing and secure with rolled gauze and Ace wrap twice daily.  Recommend light movement to help prevent contraction of the ankle.  Can call the burn clinic to see if an earlier appointment is available.  No signs of infection at this time.  Watch for signs of infection including fever, purulent drainage, significant increase in pain and worsening redness.  Clear Yellowish drainage is normal.  Return to urgent care as needed

## 2024-07-29 ENCOUNTER — Encounter (HOSPITAL_COMMUNITY): Payer: Self-pay

## 2024-07-29 ENCOUNTER — Ambulatory Visit (HOSPITAL_COMMUNITY)
Admission: RE | Admit: 2024-07-29 | Discharge: 2024-07-29 | Disposition: A | Discharge status: Home / Self Care | Payer: Self-pay | Source: Ambulatory Visit | Attending: Family Medicine | Admitting: Family Medicine

## 2024-07-29 VITALS — BP 106/74 | HR 92 | Temp 99.9°F | Resp 16

## 2024-07-29 DIAGNOSIS — T25221D Burn of second degree of right foot, subsequent encounter: Secondary | ICD-10-CM | POA: Diagnosis not present

## 2024-07-29 MED ORDER — MORPHINE SULFATE (PF) 2 MG/ML IV SOLN
INTRAVENOUS | Status: AC
  Filled 2024-07-29: qty 2

## 2024-07-29 MED ORDER — OXYCODONE-ACETAMINOPHEN 5-325 MG PO TABS: 1.0000 | ORAL_TABLET | Freq: Four times a day (QID) | ORAL | 0 refills | Status: AC | PRN

## 2024-07-29 MED ORDER — MORPHINE SULFATE (PF) 2 MG/ML IV SOLN
4.0000 mg | Freq: Once | INTRAVENOUS | Status: AC
  Administered 2024-07-29: 4 mg via INTRAMUSCULAR

## 2024-07-29 NOTE — ED Triage Notes (Signed)
 Patient here today for follow up of a burn on her right foot 10 days ago. Patient has been using Silvadene cream but still in a lot of pain. Patient states that she has a lot of numbness and tingling. Patient is also having difficulty walking. Patient has not started the antibiotic yet.

## 2024-07-29 NOTE — Discharge Instructions (Signed)
 Be aware, you have been prescribed pain medications that may cause drowsiness. While taking this medication, do not take any other medications containing acetaminophen (Tylenol). Do not combine with alcohol or recreational drugs. Please do not drive, operate heavy machinery, or take part in activities that require making important decisions while on this medication as your judgement may be clouded.

## 2024-07-29 NOTE — ED Provider Notes (Signed)
 [facility]   [id number] 07/29/24 Arrival Time: 1628    History              Chief Complaint     Chief Complaint  Patient presents with   Burn      Entered by patient    HPI Beverly Marquez is a 33 y.o. female.   Pt with a non-contributory medical history is here today for a follow up of a partial thickness burn to the dorsal surface of her R foot that occurred on 10/5 where she was treated at [facility] and had the area debrided. She has her initial appointment with the Burn clinic at [facility] on 10/20. Her pain is not well controlled and she would like for us  to re-evaluate the wound. She was seen in UC on 10/7 and prescribed Keflex 500 mg four times a day for 5 days, however, she did not start taking them because she did not feel that her wound was infected. She reports that the exudate on the dressing during changes have not changed in appearance and she denies any odor. Pt has been using hydrocodone-acetaminophen  5-325 twice daily for pain which has not adequately controlled the pain. Pt reports difficulty sleeping at night.   The history is provided by the patient.  Burn        Past Medical History:  Diagnosis Date   Herpes     Trichomonas                Patient Active Problem List    Diagnosis Date Noted   Vaginal discharge 01/30/2023   Concern about sexually transmitted disease in female without diagnosis 01/30/2023   Supervision of other normal pregnancy, antepartum 11/09/2020   Contraception 09/12/2016           Past Surgical History:  Procedure Laterality Date   NO PAST SURGERIES              OB History       Gravida  9   Para  3   Term  3   Preterm  0   AB  5   Living  3        SAB  1   IAB  3   Ectopic  0   Multiple      Live Births  3                 Home Medications                       Prior to Admission medications   Medication Sig Start Date End Date Taking? Authorizing Provider  cephALEXin  (KEFLEX) 500 MG capsule Take 1 capsule (500 mg total) by mouth 4 (four) times daily. 07/21/24     Ward, Jessica Z, PA-C  silver sulfADIAZINE (SILVADENE) 1 % cream Apply 1 Application topically 2 (two) times daily. 07/24/24     White, Elizabeth A, PA-C  valACYclovir  (VALTREX ) 500 MG tablet Take 500 mg by mouth as needed.       [provider]      Family History      Family History  Problem Relation Age of Onset   Healthy Father     Hypertension Mother     Breast cancer Mother            Social History Social History  Social History         Tobacco Use  Smoking status: Former      Types: Cigars   Smokeless tobacco: Never  Vaping Use   Vaping status: Former  Substance Use Topics   Alcohol use: Yes      Comment: often   Drug use: Not Currently      Types: Marijuana          Allergies              Patient has no known allergies.     Review of Systems Review of Systems  Constitutional:  Negative for chills and fever.  All other systems reviewed and are negative.      Physical Exam Updated Vital Signs BP 106/74 (BP Location: Right Arm)   Pulse 92   Temp 99.9 F (37.7 C) (Oral)   Resp 16   LMP 07/01/2024 (Approximate)   SpO2 95%    Physical Exam Constitutional:      Appearance: Normal appearance.  Cardiovascular:     Rate and Rhythm: Normal rate and regular rhythm.     Pulses: Normal pulses.     Heart sounds: Normal heart sounds.  Pulmonary:     Effort: Pulmonary effort is normal.     Breath sounds: Normal breath sounds.  Feet:     Comments: Dorsal aspect of the R foot with partial thickness burn sparing the toes. Large area of pink granulation tissue present. Serous drainage noted. See attached image.    Distal sensation is intact with 2 sec cap refill. No surrounding warmth, erythema, or signs of infection.  Neurological:     Mental Status: She is alert.  Psychiatric:     Comments: tearful        ED Treatments / Results  Labs (all  labs ordered are listed, but only abnormal results are displayed) Labs Reviewed - No data to display   EKG   Radiology Imaging Results (Last 48 hours)  No results found.     Procedures Procedures (including critical care time)   Medications Ordered in ED Medications  morphine (PF) 2 MG/ML injection 4 mg (4 mg Intramuscular Given 07/29/24 1722)        Initial Impression / Assessment and Plan / ED Course  I have reviewed the triage vital signs and the nursing notes.   Pertinent labs & imaging results that were available during my care of the patient were reviewed by me and considered in my medical decision making (see chart for details).   Partial Thickness Burn 4 mg of morphine given IM in clinic today.  Will start oxycodone -acetaminophen  5-325 mg PO q 6 hours PRN for pain. Instructed patient to start taking the Keflex prescription previously provided for infection prophylaxis. Encouraged pt to keep appointment for burn clinic on 10/20. Red flag symptoms reviewed and return precautions given.      Final Clinical Impressions(s) / ED Diagnoses    1. Partial thickness burn of right foot, subsequent encounter      Meds ordered this encounter  Medications   morphine (PF) 2 MG/ML injection 4 mg    Refill:  0   oxyCODONE -acetaminophen  (PERCOCET/ROXICET) 5-325 MG tablet    Sig: Take 1-2 tablets by mouth every 6 (six) hours as needed for severe pain (pain score 7-10).    Dispense:  30 tablet    Refill:  0      Rolinda Rogue, MD 07/29/24 [phone]
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
## 2024-08-03 DIAGNOSIS — G8911 Acute pain due to trauma: Secondary | ICD-10-CM | POA: Diagnosis not present

## 2024-08-03 DIAGNOSIS — T310 Burns involving less than 10% of body surface: Secondary | ICD-10-CM | POA: Diagnosis not present

## 2024-08-10 DIAGNOSIS — T310 Burns involving less than 10% of body surface: Secondary | ICD-10-CM | POA: Diagnosis not present

## 2024-08-29 ENCOUNTER — Ambulatory Visit (HOSPITAL_COMMUNITY)
Admission: RE | Admit: 2024-08-29 | Discharge: 2024-08-29 | Disposition: A | Discharge status: Home / Self Care | Source: Ambulatory Visit | Attending: Family Medicine | Admitting: Family Medicine

## 2024-08-29 ENCOUNTER — Encounter (HOSPITAL_COMMUNITY): Payer: Self-pay

## 2024-08-29 VITALS — BP 108/70 | HR 87 | Temp 98.0°F | Resp 14

## 2024-08-29 DIAGNOSIS — T25221D Burn of second degree of right foot, subsequent encounter: Secondary | ICD-10-CM | POA: Diagnosis not present

## 2024-08-29 MED ORDER — SILVER SULFADIAZINE 1 % EX CREA
TOPICAL_CREAM | CUTANEOUS | Status: AC
  Filled 2024-08-29: qty 85

## 2024-08-29 MED ORDER — SILVER SULFADIAZINE 1 % EX CREA: 1.0000 | TOPICAL_CREAM | Freq: Two times a day (BID) | CUTANEOUS | 1 refills | Status: AC

## 2024-08-29 NOTE — ED Triage Notes (Signed)
 Patient reports that she is out of Silvadene Cream and is scheduled for skin graft surgery on 09/04/24. Patient states she is using the Silvadene cream once a day.
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
## 2024-08-29 NOTE — Discharge Instructions (Signed)
 Apply the silvadene 2 times daily as instructed by the burn specialists.

## 2024-09-04 DIAGNOSIS — T25321A Burn of third degree of right foot, initial encounter: Secondary | ICD-10-CM | POA: Diagnosis not present

## 2024-09-04 DIAGNOSIS — T310 Burns involving less than 10% of body surface: Secondary | ICD-10-CM | POA: Diagnosis not present

## 2024-09-09 DIAGNOSIS — T300 Burn of unspecified body region, unspecified degree: Secondary | ICD-10-CM | POA: Diagnosis not present

## 2024-09-21 DIAGNOSIS — T310 Burns involving less than 10% of body surface: Secondary | ICD-10-CM | POA: Diagnosis not present

## 2024-09-21 DIAGNOSIS — Z945 Skin transplant status: Secondary | ICD-10-CM | POA: Diagnosis not present

## 2024-09-21 DIAGNOSIS — Z48817 Encounter for surgical aftercare following surgery on the skin and subcutaneous tissue: Secondary | ICD-10-CM | POA: Diagnosis not present

## 2024-10-05 ENCOUNTER — Ambulatory Visit (HOSPITAL_COMMUNITY): Payer: Self-pay

## 2024-10-09 ENCOUNTER — Ambulatory Visit (HOSPITAL_COMMUNITY)
Admission: RE | Admit: 2024-10-09 | Discharge: 2024-10-09 | Disposition: A | Discharge status: Home / Self Care | Payer: Self-pay | Source: Ambulatory Visit | Attending: Physician Assistant | Admitting: Physician Assistant

## 2024-10-09 VITALS — BP 117/75 | HR 95 | Temp 98.9°F | Resp 18

## 2024-10-09 DIAGNOSIS — J101 Influenza due to other identified influenza virus with other respiratory manifestations: Secondary | ICD-10-CM | POA: Insufficient documentation

## 2024-10-09 DIAGNOSIS — R051 Acute cough: Secondary | ICD-10-CM | POA: Insufficient documentation

## 2024-10-09 DIAGNOSIS — N898 Other specified noninflammatory disorders of vagina: Secondary | ICD-10-CM | POA: Insufficient documentation

## 2024-10-09 DIAGNOSIS — Z113 Encounter for screening for infections with a predominantly sexual mode of transmission: Secondary | ICD-10-CM | POA: Insufficient documentation

## 2024-10-09 LAB — POC COVID19/FLU A&B COMBO
Covid Antigen, POC: NEGATIVE
Influenza A Antigen, POC: POSITIVE — AB
Influenza B Antigen, POC: NEGATIVE

## 2024-10-09 LAB — HIV ANTIBODY (ROUTINE TESTING W REFLEX): HIV Screen 4th Generation wRfx: NONREACTIVE

## 2024-10-09 MED ORDER — FLUCONAZOLE 150 MG PO TABS: 150.0000 mg | ORAL_TABLET | ORAL | 0 refills | Status: AC | PRN

## 2024-10-09 MED ORDER — OSELTAMIVIR PHOSPHATE 75 MG PO CAPS: 75.0000 mg | ORAL_CAPSULE | Freq: Two times a day (BID) | ORAL | 0 refills | Status: AC

## 2024-10-09 MED ORDER — PROMETHAZINE-DM 6.25-15 MG/5ML PO SYRP: 5.0000 mL | ORAL_SOLUTION | Freq: Two times a day (BID) | ORAL | 0 refills | Status: AC | PRN

## 2024-10-09 NOTE — ED Triage Notes (Signed)
 Patient presents to the office for vaginal irritation and discharge x 2 days. Patient would like STD testing.

## 2024-10-09 NOTE — Discharge Instructions (Signed)
 We are treating you for yeast infection.  Take fluconazole  today and a second dose in 3 days if your symptoms persist.  We will contact you if any of your other testing is positive and we need to arrange additional treatment.  Abstain from sex and to receive the results.  If you develop any pelvic pain, abdominal pain, fever, nausea, vomiting you need to be seen immediately.  You tested positive for influenza A.  Start Tamiflu  twice daily for 5 days.  Use over-the-counter medication including Tylenol , Mucinex, Flonase , ibuprofen , nasal saline/sinus rinses, humidifier in your room to help manage her symptoms.  Make sure that you are drinking plenty of fluid.  Take Promethazine  DM for cough.  This will make you sleepy so do not drive or drink alcohol while taking it.  If you are not feeling better within 3 to 5 days please return for reevaluation.  If anything worsens and you have high fever, worsening cough, shortness of breath, chest pain you need to be seen immediately.
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
## 2024-10-10 LAB — SYPHILIS: RPR W/REFLEX TO RPR TITER AND TREPONEMAL ANTIBODIES, TRADITIONAL SCREENING AND DIAGNOSIS ALGORITHM: RPR Ser Ql: NONREACTIVE

## 2024-10-12 LAB — CERVICOVAGINAL ANCILLARY ONLY
Bacterial Vaginitis (gardnerella): NEGATIVE
Candida Glabrata: NEGATIVE
Candida Vaginitis: NEGATIVE
Chlamydia: NEGATIVE
Comment: NEGATIVE
Comment: NEGATIVE
Comment: NEGATIVE
Comment: NEGATIVE
Comment: NEGATIVE
Comment: NORMAL
Neisseria Gonorrhea: NEGATIVE
Trichomonas: NEGATIVE

## 2024-11-21 ENCOUNTER — Ambulatory Visit (HOSPITAL_COMMUNITY): Payer: Self-pay
